# Patient Record
Sex: Female | Born: 1965
Health system: Southern US, Community
[De-identification: ages and names within clinical notes are randomized; demographics above are authoritative.]

## PROBLEM LIST (undated history)

## (undated) DIAGNOSIS — Z86010 Personal history of colonic polyps: Secondary | ICD-10-CM

## (undated) DIAGNOSIS — D869 Sarcoidosis, unspecified: Secondary | ICD-10-CM

## (undated) DIAGNOSIS — Z860101 Personal history of adenomatous and serrated colon polyps: Secondary | ICD-10-CM

## (undated) DIAGNOSIS — H269 Unspecified cataract: Secondary | ICD-10-CM

## (undated) DIAGNOSIS — M1990 Unspecified osteoarthritis, unspecified site: Secondary | ICD-10-CM

## (undated) DIAGNOSIS — K859 Acute pancreatitis without necrosis or infection, unspecified: Secondary | ICD-10-CM

## (undated) DIAGNOSIS — F32A Depression, unspecified: Secondary | ICD-10-CM

## (undated) DIAGNOSIS — K429 Umbilical hernia without obstruction or gangrene: Secondary | ICD-10-CM

## (undated) DIAGNOSIS — Z5189 Encounter for other specified aftercare: Secondary | ICD-10-CM

## (undated) DIAGNOSIS — E119 Type 2 diabetes mellitus without complications: Secondary | ICD-10-CM

## (undated) DIAGNOSIS — D649 Anemia, unspecified: Secondary | ICD-10-CM

## (undated) DIAGNOSIS — M810 Age-related osteoporosis without current pathological fracture: Secondary | ICD-10-CM

## (undated) DIAGNOSIS — R011 Cardiac murmur, unspecified: Secondary | ICD-10-CM

## (undated) DIAGNOSIS — A6000 Herpesviral infection of urogenital system, unspecified: Secondary | ICD-10-CM

## (undated) DIAGNOSIS — I10 Essential (primary) hypertension: Secondary | ICD-10-CM

## (undated) DIAGNOSIS — F419 Anxiety disorder, unspecified: Secondary | ICD-10-CM

## (undated) DIAGNOSIS — K635 Polyp of colon: Secondary | ICD-10-CM

## (undated) DIAGNOSIS — K5790 Diverticulosis of intestine, part unspecified, without perforation or abscess without bleeding: Secondary | ICD-10-CM

## (undated) DIAGNOSIS — F329 Major depressive disorder, single episode, unspecified: Secondary | ICD-10-CM

## (undated) HISTORY — DX: Major depressive disorder, single episode, unspecified: F32.9

## (undated) HISTORY — DX: Personal history of colonic polyps: Z86.010

## (undated) HISTORY — DX: Anemia, unspecified: D64.9

## (undated) HISTORY — PX: CHOLECYSTECTOMY: SHX55

## (undated) HISTORY — DX: Acute pancreatitis without necrosis or infection, unspecified: K85.90

## (undated) HISTORY — DX: Diverticulosis of intestine, part unspecified, without perforation or abscess without bleeding: K57.90

## (undated) HISTORY — DX: Sarcoidosis, unspecified: D86.9

## (undated) HISTORY — DX: Essential (primary) hypertension: I10

## (undated) HISTORY — PX: PARTIAL HYSTERECTOMY: SHX80

## (undated) HISTORY — DX: Umbilical hernia without obstruction or gangrene: K42.9

## (undated) HISTORY — DX: Herpesviral infection of urogenital system, unspecified: A60.00

## (undated) HISTORY — DX: Personal history of adenomatous and serrated colon polyps: Z86.0101

## (undated) HISTORY — PX: TONSILLECTOMY: SUR1361

## (undated) HISTORY — DX: Depression, unspecified: F32.A

## (undated) HISTORY — DX: Type 2 diabetes mellitus without complications: E11.9

## (undated) HISTORY — DX: Anxiety disorder, unspecified: F41.9

## (undated) HISTORY — PX: HERNIA REPAIR: SHX51

## (undated) HISTORY — DX: Age-related osteoporosis without current pathological fracture: M81.0

## (undated) HISTORY — DX: Unspecified osteoarthritis, unspecified site: M19.90

## (undated) HISTORY — DX: Polyp of colon: K63.5

## (undated) HISTORY — DX: Unspecified cataract: H26.9

## (undated) HISTORY — DX: Encounter for other specified aftercare: Z51.89

## (undated) HISTORY — DX: Cardiac murmur, unspecified: R01.1

---

## 2004-08-04 ENCOUNTER — Inpatient Hospital Stay (HOSPITAL_COMMUNITY): Admission: AD | Admit: 2004-08-04 | Discharge: 2004-08-04 | Payer: Self-pay | Admitting: Family Medicine

## 2004-11-11 ENCOUNTER — Inpatient Hospital Stay (HOSPITAL_COMMUNITY): Admission: AD | Admit: 2004-11-11 | Discharge: 2004-11-11 | Payer: Self-pay | Admitting: *Deleted

## 2005-01-18 ENCOUNTER — Ambulatory Visit: Payer: Self-pay | Admitting: Family Medicine

## 2005-03-21 ENCOUNTER — Ambulatory Visit: Payer: Self-pay | Admitting: Internal Medicine

## 2005-04-05 ENCOUNTER — Inpatient Hospital Stay: Payer: Self-pay | Admitting: Unknown Physician Specialty

## 2005-08-22 ENCOUNTER — Inpatient Hospital Stay: Payer: Self-pay | Admitting: Internal Medicine

## 2006-03-11 ENCOUNTER — Ambulatory Visit: Payer: Self-pay | Admitting: Gastroenterology

## 2006-03-12 ENCOUNTER — Encounter: Admission: RE | Admit: 2006-03-12 | Discharge: 2006-03-12 | Payer: Self-pay | Admitting: Family Medicine

## 2006-03-27 ENCOUNTER — Ambulatory Visit: Payer: Self-pay | Admitting: Gastroenterology

## 2006-04-12 ENCOUNTER — Ambulatory Visit: Payer: Self-pay | Admitting: Internal Medicine

## 2007-01-28 ENCOUNTER — Ambulatory Visit: Payer: Self-pay | Admitting: Internal Medicine

## 2007-01-28 LAB — CONVERTED CEMR LAB
Albumin: 3.6 g/dL (ref 3.5–5.2)
Angiotensin 1 Converting Enzyme: 79 units/L — ABNORMAL HIGH (ref 9–67)
Basophils Absolute: 0 10*3/uL (ref 0.0–0.1)
Bilirubin, Direct: 0.1 mg/dL (ref 0.0–0.3)
Eosinophils Absolute: 0 10*3/uL (ref 0.0–0.6)
Eosinophils Relative: 0.7 % (ref 0.0–5.0)
GFR calc Af Amer: 79 mL/min
GFR calc non Af Amer: 66 mL/min
Glucose, Bld: 84 mg/dL (ref 70–99)
HCT: 41.4 % (ref 36.0–46.0)
Hemoglobin: 14.3 g/dL (ref 12.0–15.0)
Lymphocytes Relative: 47.6 % — ABNORMAL HIGH (ref 12.0–46.0)
MCHC: 34.7 g/dL (ref 30.0–36.0)
MCV: 96.2 fL (ref 78.0–100.0)
Monocytes Absolute: 0.8 10*3/uL — ABNORMAL HIGH (ref 0.2–0.7)
Neutro Abs: 1.7 10*3/uL (ref 1.4–7.7)
Neutrophils Relative %: 35.3 % — ABNORMAL LOW (ref 43.0–77.0)
Potassium: 4.4 meq/L (ref 3.5–5.1)
RBC: 4.3 M/uL (ref 3.87–5.11)
Sodium: 143 meq/L (ref 135–145)
TSH: 0.7 microintl units/mL (ref 0.35–5.50)

## 2007-02-26 ENCOUNTER — Emergency Department (HOSPITAL_COMMUNITY): Admission: EM | Admit: 2007-02-26 | Discharge: 2007-02-27 | Payer: Self-pay | Admitting: Emergency Medicine

## 2007-05-01 ENCOUNTER — Emergency Department (HOSPITAL_COMMUNITY): Admission: EM | Admit: 2007-05-01 | Discharge: 2007-05-01 | Payer: Self-pay | Admitting: Emergency Medicine

## 2007-05-02 ENCOUNTER — Emergency Department (HOSPITAL_COMMUNITY): Admission: EM | Admit: 2007-05-02 | Discharge: 2007-05-02 | Payer: Self-pay | Admitting: Emergency Medicine

## 2007-05-03 ENCOUNTER — Emergency Department (HOSPITAL_COMMUNITY): Admission: EM | Admit: 2007-05-03 | Discharge: 2007-05-03 | Payer: Self-pay | Admitting: *Deleted

## 2007-10-03 DIAGNOSIS — J4530 Mild persistent asthma, uncomplicated: Secondary | ICD-10-CM | POA: Insufficient documentation

## 2007-10-03 DIAGNOSIS — H1010 Acute atopic conjunctivitis, unspecified eye: Secondary | ICD-10-CM | POA: Insufficient documentation

## 2007-10-03 DIAGNOSIS — J309 Allergic rhinitis, unspecified: Secondary | ICD-10-CM

## 2007-10-03 DIAGNOSIS — D869 Sarcoidosis, unspecified: Secondary | ICD-10-CM | POA: Insufficient documentation

## 2007-11-07 ENCOUNTER — Encounter: Payer: Self-pay | Admitting: Internal Medicine

## 2007-12-19 ENCOUNTER — Encounter: Payer: Self-pay | Admitting: Internal Medicine

## 2007-12-26 ENCOUNTER — Ambulatory Visit: Payer: Self-pay | Admitting: Internal Medicine

## 2007-12-26 LAB — CONVERTED CEMR LAB
AST: 22 units/L (ref 0–37)
Albumin: 3.8 g/dL (ref 3.5–5.2)
Alkaline Phosphatase: 76 units/L (ref 39–117)
BUN: 7 mg/dL (ref 6–23)
Basophils Absolute: 0 10*3/uL (ref 0.0–0.1)
Basophils Relative: 0.4 % (ref 0.0–1.0)
CO2: 27 meq/L (ref 19–32)
Chloride: 101 meq/L (ref 96–112)
Creatinine, Ser: 0.9 mg/dL (ref 0.4–1.2)
HCT: 39.8 % (ref 36.0–46.0)
Hemoglobin: 13.2 g/dL (ref 12.0–15.0)
MCHC: 33.2 g/dL (ref 30.0–36.0)
Monocytes Absolute: 0.8 10*3/uL — ABNORMAL HIGH (ref 0.2–0.7)
Neutrophils Relative %: 46.5 % (ref 43.0–77.0)
Potassium: 3.2 meq/L — ABNORMAL LOW (ref 3.5–5.1)
RBC: 4.27 M/uL (ref 3.87–5.11)
RDW: 12.5 % (ref 11.5–14.6)
Sodium: 138 meq/L (ref 135–145)
Total Bilirubin: 0.7 mg/dL (ref 0.3–1.2)
Total Protein: 7.8 g/dL (ref 6.0–8.3)

## 2007-12-29 ENCOUNTER — Ambulatory Visit: Payer: Self-pay | Admitting: Internal Medicine

## 2008-01-01 ENCOUNTER — Telehealth (INDEPENDENT_AMBULATORY_CARE_PROVIDER_SITE_OTHER): Payer: Self-pay | Admitting: *Deleted

## 2008-01-15 ENCOUNTER — Telehealth (INDEPENDENT_AMBULATORY_CARE_PROVIDER_SITE_OTHER): Payer: Self-pay | Admitting: *Deleted

## 2008-01-27 ENCOUNTER — Ambulatory Visit: Payer: Self-pay | Admitting: Internal Medicine

## 2008-01-27 DIAGNOSIS — H1045 Other chronic allergic conjunctivitis: Secondary | ICD-10-CM | POA: Insufficient documentation

## 2008-01-28 ENCOUNTER — Encounter: Payer: Self-pay | Admitting: Internal Medicine

## 2008-02-03 ENCOUNTER — Emergency Department (HOSPITAL_COMMUNITY): Admission: EM | Admit: 2008-02-03 | Discharge: 2008-02-03 | Payer: Self-pay | Admitting: Emergency Medicine

## 2008-05-17 ENCOUNTER — Telehealth (INDEPENDENT_AMBULATORY_CARE_PROVIDER_SITE_OTHER): Payer: Self-pay | Admitting: *Deleted

## 2008-05-19 ENCOUNTER — Encounter: Payer: Self-pay | Admitting: Internal Medicine

## 2008-08-16 ENCOUNTER — Telehealth (INDEPENDENT_AMBULATORY_CARE_PROVIDER_SITE_OTHER): Payer: Self-pay | Admitting: *Deleted

## 2008-08-23 ENCOUNTER — Telehealth (INDEPENDENT_AMBULATORY_CARE_PROVIDER_SITE_OTHER): Payer: Self-pay | Admitting: *Deleted

## 2008-08-24 ENCOUNTER — Ambulatory Visit: Payer: Self-pay | Admitting: Internal Medicine

## 2008-08-24 DIAGNOSIS — J40 Bronchitis, not specified as acute or chronic: Secondary | ICD-10-CM | POA: Insufficient documentation

## 2008-09-08 ENCOUNTER — Telehealth: Payer: Self-pay | Admitting: Internal Medicine

## 2008-10-26 ENCOUNTER — Emergency Department (HOSPITAL_COMMUNITY): Admission: EM | Admit: 2008-10-26 | Discharge: 2008-10-26 | Payer: Self-pay | Admitting: Emergency Medicine

## 2008-11-27 ENCOUNTER — Encounter: Admission: RE | Admit: 2008-11-27 | Discharge: 2008-11-27 | Payer: Self-pay | Admitting: Sports Medicine

## 2008-12-22 ENCOUNTER — Encounter: Admission: RE | Admit: 2008-12-22 | Discharge: 2009-01-17 | Payer: Self-pay | Admitting: Unknown Physician Specialty

## 2009-02-10 ENCOUNTER — Ambulatory Visit: Payer: Self-pay | Admitting: Internal Medicine

## 2009-02-14 ENCOUNTER — Ambulatory Visit: Payer: Self-pay | Admitting: Internal Medicine

## 2009-02-15 ENCOUNTER — Telehealth: Payer: Self-pay | Admitting: Internal Medicine

## 2009-02-16 LAB — CONVERTED CEMR LAB
Basophils Absolute: 0 10*3/uL (ref 0.0–0.1)
Eosinophils Absolute: 0.1 10*3/uL (ref 0.0–0.7)
HCT: 38 % (ref 36.0–46.0)
Lymphs Abs: 3 10*3/uL (ref 0.7–4.0)
MCHC: 33.8 g/dL (ref 30.0–36.0)
MCV: 93.8 fL (ref 78.0–100.0)
Monocytes Absolute: 0.4 10*3/uL (ref 0.1–1.0)
Neutrophils Relative %: 48.3 % (ref 43.0–77.0)
Platelets: 269 10*3/uL (ref 150.0–400.0)
RDW: 12.7 % (ref 11.5–14.6)

## 2009-03-08 ENCOUNTER — Ambulatory Visit: Payer: Self-pay | Admitting: Internal Medicine

## 2009-03-08 ENCOUNTER — Telehealth: Payer: Self-pay | Admitting: Internal Medicine

## 2009-03-08 LAB — CONVERTED CEMR LAB: Angiotensin 1 Converting Enzyme: 91 units/L — ABNORMAL HIGH (ref 9–67)

## 2009-03-09 ENCOUNTER — Telehealth (INDEPENDENT_AMBULATORY_CARE_PROVIDER_SITE_OTHER): Payer: Self-pay | Admitting: *Deleted

## 2009-03-15 ENCOUNTER — Ambulatory Visit: Payer: Self-pay | Admitting: Internal Medicine

## 2009-03-15 DIAGNOSIS — J029 Acute pharyngitis, unspecified: Secondary | ICD-10-CM | POA: Insufficient documentation

## 2009-03-21 ENCOUNTER — Telehealth (INDEPENDENT_AMBULATORY_CARE_PROVIDER_SITE_OTHER): Payer: Self-pay | Admitting: *Deleted

## 2009-03-23 LAB — CONVERTED CEMR LAB
AST: 57 units/L — ABNORMAL HIGH (ref 0–37)
BUN: 8 mg/dL (ref 6–23)
Basophils Absolute: 0 10*3/uL (ref 0.0–0.1)
Bilirubin, Direct: 0.1 mg/dL (ref 0.0–0.3)
CO2: 33 meq/L — ABNORMAL HIGH (ref 19–32)
Calcium: 10 mg/dL (ref 8.4–10.5)
Glucose, Bld: 76 mg/dL (ref 70–99)
HCT: 39.5 % (ref 36.0–46.0)
Lymphocytes Relative: 36.5 % (ref 12.0–46.0)
Lymphs Abs: 2.6 10*3/uL (ref 0.7–4.0)
Monocytes Relative: 7.2 % (ref 3.0–12.0)
Neutrophils Relative %: 53.8 % (ref 43.0–77.0)
Platelets: 281 10*3/uL (ref 150.0–400.0)
RDW: 12.4 % (ref 11.5–14.6)
Sodium: 142 meq/L (ref 135–145)
Total Bilirubin: 0.6 mg/dL (ref 0.3–1.2)
WBC: 7.1 10*3/uL (ref 4.5–10.5)

## 2009-04-01 ENCOUNTER — Ambulatory Visit: Payer: Self-pay | Admitting: Internal Medicine

## 2009-05-10 ENCOUNTER — Encounter: Payer: Self-pay | Admitting: Internal Medicine

## 2009-05-12 ENCOUNTER — Telehealth: Payer: Self-pay | Admitting: Internal Medicine

## 2009-05-16 ENCOUNTER — Encounter: Payer: Self-pay | Admitting: Internal Medicine

## 2009-06-28 ENCOUNTER — Emergency Department (HOSPITAL_COMMUNITY): Admission: EM | Admit: 2009-06-28 | Discharge: 2009-06-29 | Payer: Self-pay | Admitting: Emergency Medicine

## 2009-07-15 ENCOUNTER — Ambulatory Visit: Payer: Self-pay | Admitting: Internal Medicine

## 2009-08-04 ENCOUNTER — Ambulatory Visit: Payer: Self-pay | Admitting: Internal Medicine

## 2009-08-11 ENCOUNTER — Encounter: Admission: RE | Admit: 2009-08-11 | Discharge: 2009-08-11 | Payer: Self-pay | Admitting: Gastroenterology

## 2009-08-12 LAB — CONVERTED CEMR LAB
ALT: 13 units/L (ref 0–35)
Albumin: 3.8 g/dL (ref 3.5–5.2)
Angiotensin 1 Converting Enzyme: 67 units/L (ref 9–67)
Basophils Relative: 1.1 % (ref 0.0–3.0)
Eosinophils Relative: 3.1 % (ref 0.0–5.0)
HCT: 40.3 % (ref 36.0–46.0)
Lymphs Abs: 2.5 10*3/uL (ref 0.7–4.0)
MCV: 96 fL (ref 78.0–100.0)
Monocytes Absolute: 0.5 10*3/uL (ref 0.1–1.0)
Platelets: 285 10*3/uL (ref 150.0–400.0)
RBC: 4.19 M/uL (ref 3.87–5.11)
Total Protein: 7.9 g/dL (ref 6.0–8.3)
WBC: 6 10*3/uL (ref 4.5–10.5)

## 2009-09-07 ENCOUNTER — Ambulatory Visit: Payer: Self-pay | Admitting: Internal Medicine

## 2009-11-09 ENCOUNTER — Ambulatory Visit: Payer: Self-pay | Admitting: Internal Medicine

## 2009-11-16 ENCOUNTER — Encounter: Payer: Self-pay | Admitting: Internal Medicine

## 2009-11-16 LAB — CONVERTED CEMR LAB: Angiotensin 1 Converting Enzyme: 67 units/L (ref 9–67)

## 2009-11-21 ENCOUNTER — Telehealth: Payer: Self-pay | Admitting: Internal Medicine

## 2009-11-28 ENCOUNTER — Telehealth (INDEPENDENT_AMBULATORY_CARE_PROVIDER_SITE_OTHER): Payer: Self-pay | Admitting: *Deleted

## 2010-01-04 ENCOUNTER — Ambulatory Visit: Payer: Self-pay | Admitting: Internal Medicine

## 2010-01-06 ENCOUNTER — Ambulatory Visit: Payer: Self-pay | Admitting: Internal Medicine

## 2010-03-08 ENCOUNTER — Ambulatory Visit: Payer: Self-pay | Admitting: Internal Medicine

## 2010-09-04 ENCOUNTER — Ambulatory Visit: Payer: Self-pay | Admitting: Internal Medicine

## 2010-09-04 DIAGNOSIS — K219 Gastro-esophageal reflux disease without esophagitis: Secondary | ICD-10-CM | POA: Insufficient documentation

## 2010-09-05 ENCOUNTER — Inpatient Hospital Stay (HOSPITAL_COMMUNITY): Admission: EM | Admit: 2010-09-05 | Discharge: 2010-09-08 | Payer: Self-pay | Admitting: Emergency Medicine

## 2010-09-11 ENCOUNTER — Telehealth: Payer: Self-pay | Admitting: Internal Medicine

## 2010-12-14 ENCOUNTER — Emergency Department (HOSPITAL_COMMUNITY)
Admission: EM | Admit: 2010-12-14 | Discharge: 2010-12-15 | Payer: Self-pay | Source: Home / Self Care | Admitting: Emergency Medicine

## 2010-12-19 NOTE — Assessment & Plan Note (Signed)
Summary: 2 months/apc   Copy to:  A.V.Blount  Primary Provider/Referring Provider:  Pershing Proud / [facility]  CC:  2 month follow up visit.  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  November 09, 2009- Sarcoid, Rhinoconjunctivitis Primary MD gave Abilify, and a cream for the rash on her back- triamcinolone.  Notes short of breath with cough productive of clear to trace brown. Coughs until she retches. Wakes coughing and spitting. Has felt she has a cold, treating otc. Cough syrup helps. Left ribs hurt when on side. Denies fever, sweats other than hormones, swollen nodes.  January 04, 2010- Sarcoid, Rhinoconjunctivitis Blames pollen for 1 week of eyes watering, throat sore, nasal conjunctivits, sneeze. No cough. Chest tight. Went to her primary yest and got scripts for  bactrim and loratadine.  ACE level ULN at 67. last CXR stable scarring. Likes Neti pot.  January 06, 2010- Sarcoid, Rhinconjunctivits Head congestion and vertex headeache. Blowing out of head- yellow green. Sore throat. conitnues to do her Neti pot. Was just here 2 days ago.  March 08, 2010- Sarcoid, rhinoconjunctivitis Pollen makes eyes and nose water. Stable exercise tolerance with dyspnea. Blames Abilify for weight gain and abdominal obesity, so she stopped it. Using her neb about twice daily now. Uses Flovent and Ventolin daily. We reviewed these again. Denies fever, nodes, chest pain. palpitation, rash.   Current Medications (verified): 1)  Ventolin Hfa 108 (90 Base) Mcg/act Aers (Albuterol Sulfate) .... 2 Puffs Four Times A Day As Needed 2)  Flovent Hfa 220 Mcg/act  Aero (Fluticasone Propionate  Hfa) .... 2 Inhalations Two Times A Day 3)  Albuterol Sulfate (2.5 Mg/89ml) 0.083%  Nebu (Albuterol Sulfate) .Marland Kitchen.. 1 Neb Four Times A Day Prn  Allergies (verified): 1)  ! Pcn 2)  ! Cipro 3)  ! * Latex  Past  History:  Past Medical History: Last updated: 12/26/2007 Asthma Sarcoidosis Diverticulosis colon polyps  Past Surgical History: Last updated: 08/04/2009 C-section x 3 lap- cholecystectomy hernia repair x 4 tonsillectomy Hysterectomy- fibroids  Family History: Last updated: 01/27/2008 Mother- ovarian cancer, colon polyps sister- breast cancer Father - colon cancer  Social History: Last updated: 08/04/2009 Patient states former smoker. Son incarcerated  Disability due to sarcoid Studying criminal justice on-line course  Risk Factors: Smoking Status: quit (12/26/2007) Packs/Day: ? (12/26/2007)  Review of Systems      See HPI       The patient complains of dyspnea on exertion.  The patient denies anorexia, fever, weight loss, weight gain, vision loss, decreased hearing, hoarseness, chest pain, syncope, peripheral edema, prolonged cough, headaches, hemoptysis, and severe indigestion/heartburn.    Vital Signs:  Patient profile:   45 year old female Height:      64 inches Weight:      158.38 pounds BMI:     27.28 O2 Sat:      99 % on Room air Pulse rate:   64 / minute BP sitting:   110 / 72  (left arm) Cuff size:   regular  Vitals Entered By: Reynaldo Minium CMA (March 08, 2010 9:38 AM)  O2 Flow:  Room air  Physical Exam  Additional Exam:  General: A/Ox3; pleasant and cooperative, NAD, SKIN: faint bilateral hyperpigmented rash with minimal excoriation across her back NODES:  fullness of supraclavicular nodes. HEENT: [place]/AT, EOM- WNL, Conjuctivae- clear, PERRLA, TM-WNL, Nose- turbinate edema, yellow, Throat- clear and wnl, Mallampati  II-III NECK: Supple w/ fair ROM, JVD- none, normal carotid impulses w/o bruits Thyroid-  CHEST: Clear to P&A, minor cough, unlabored HEART: RRR, no m/g/r heard ABDOMEN: Soft and nl;  ZOX:WRUE, nl pulses, no edema  NEURO: Grossly intact to observation      Impression & Recommendations:  Problem # 1:  RHINITIS  (ICD-472.0)  Allergic rhinoconjunctivitis. We will steer her to otc antihistamines for occasional use.  Problem # 2:  PULMONARY SARCOIDOSIS (ICD-135) This is not clinically active now. We will continue to track her status. Walking and weight control are encouraged.  Other Orders: Est. Patient Level III (45409)  Patient Instructions: 1)  Please schedule a follow-up appointment in 6 months. 2)  Try otc antihistamine for allergy as needed: 3)  Claritin/ loratadine 4)  Zyrtec/ cetirizine 5)  allegra/ fexofenadine

## 2010-12-19 NOTE — Progress Notes (Signed)
Summary: results  Phone Note Call from Patient   Caller: Patient Call For: young Summary of Call: please send blood work results sent to Dr. Jone Baseman. [facility] office - Fax [phone] Initial call taken by: Eugene Gavia,  November 28, 2009 10:12 AM  Follow-up for Phone Call        faxed labs 01/10//Juanita

## 2010-12-19 NOTE — Assessment & Plan Note (Signed)
Summary: ROV 6 MONTHS///KP   Copy to:  A.V.Blount  Primary Provider/Referring Provider:  Pershing Proud / [facility]  CC:  6 month follow up visit-asthma; doing good overall.Marland Kitchen  History of Present Illness: January 04, 2010- Sarcoid, Rhinoconjunctivitis Blames pollen for 1 week of eyes watering, throat sore, nasal conjunctivits, sneeze. No cough. Chest tight. Went to her primary yest and got scripts for  bactrim and loratadine.  ACE level ULN at 67. last CXR stable scarring. Likes Neti pot.  January 06, 2010- Sarcoid, Rhinconjunctivits Head congestion and vertex headeache. Blowing out of head- yellow green. Sore throat. conitnues to do her Neti pot. Was just here 2 days ago.  March 08, 2010- Sarcoid, rhinoconjunctivitis Pollen makes eyes and nose water. Stable exercise tolerance with dyspnea. Blames Abilify for weight gain and abdominal obesity, so she stopped it. Using her neb about twice daily now. Uses Flovent and Ventolin daily. We reviewed these again. Denies fever, nodes, chest pain. palpitation, rash.  September 04, 2010- Sarcoid, rhinoconjunctivitis, GERD Nurse CC: 6 month follow up visit-asthma; doing good overall. Used her ventolin more during the summer heat, more because she didn't know what else to do but not really wheezey. Now chest is comfortable. Sometimes feels tight in chest and will lie on side to feel better than when on stomach. Feels strangled in throat if lies on stomach.  With Fall season she is having more nasal itchin, sneezing and blowing.  CXR 08/04/09- stable scarring, NAD ACE 11/09/09- 67 (9-67).    Asthma History    Initial Asthma Severity Rating:    Age range: 12+ years    Symptoms: 0-2 days/week    Nighttime Awakenings: 0-2/month    Interferes w/ normal activity: no limitations    SABA use (not for EIB): 0-2 days/week    Asthma Severity Assessment: Intermittent   Preventive Screening-Counseling & Management  Alcohol-Tobacco     Smoking Status:  quit     Packs/Day: ?     Year Started: 10 years     Year Quit: 2007     Pack years: occ for 4 years  1 pack a week  Current Medications (verified): 1)  Ventolin Hfa 108 (90 Base) Mcg/act Aers (Albuterol Sulfate) .... 2 Puffs Four Times A Day As Needed 2)  Flovent Hfa 220 Mcg/act  Aero (Fluticasone Propionate  Hfa) .... 2 Inhalations Two Times A Day 3)  Albuterol Sulfate (2.5 Mg/43ml) 0.083%  Nebu (Albuterol Sulfate) .Marland Kitchen.. 1 Neb Four Times A Day Prn  Allergies (verified): 1)  ! Pcn 2)  ! Cipro 3)  ! * Latex  Past History:  Past Medical History: Last updated: 12/26/2007 Asthma Sarcoidosis Diverticulosis colon polyps  Past Surgical History: Last updated: 08/04/2009 C-section x 3 lap- cholecystectomy hernia repair x 4 tonsillectomy Hysterectomy- fibroids  Family History: Last updated: 01/27/2008 Mother- ovarian cancer, colon polyps sister- breast cancer Father - colon cancer  Social History: Last updated: 08/04/2009 Patient states former smoker. Son incarcerated  Disability due to sarcoid Studying criminal justice on-line course  Risk Factors: Smoking Status: quit (09/04/2010) Packs/Day: ? (09/04/2010)  Review of Systems      See HPI       The patient complains of non-productive cough, acid heartburn, and nasal congestion/difficulty breathing through nose.  The patient denies shortness of breath with activity, shortness of breath at rest, productive cough, coughing up blood, chest pain, irregular heartbeats, indigestion, loss of appetite, weight change, abdominal pain, difficulty swallowing, sore throat, tooth/dental problems, headaches, sneezing, rash, change  in color of mucus, and fever.    Vital Signs:  Patient profile:   45 year old female Height:      64 inches Weight:      165.13 pounds BMI:     28.45 O2 Sat:      100 % on Room air Pulse rate:   70 / minute BP sitting:   110 / 70  (left arm) Cuff size:   regular  Vitals Entered By: Reynaldo Minium CMA  (September 04, 2010 10:36 AM)  O2 Flow:  Room air CC: 6 month follow up visit-asthma; doing good overall.   Physical Exam  Additional Exam:  General: A/Ox3; pleasant and cooperative, NAD, SKIN: faint bilateral hyperpigmented rash with minimal excoriation across her back NODES:  fullness of supraclavicular nodes. HEENT: [place]/AT, EOM- WNL, Conjuctivae- clear, PERRLA, TM-WNL, Nose- turbinate edema, yellow, Throat- clear and wnl, Mallampati  II-III NECK: Supple w/ fair ROM, JVD- none, normal carotid impulses w/o bruits Thyroid-  CHEST: Clear to P&A, minor cough, unlabored HEART: RRR, no m/g/r heard ABDOMEN: Soft and nl;  ZOX:WRUE, nl pulses, no edema  NEURO: Grossly intact to observation      Impression & Recommendations:  Problem # 1:  PULMONARY SARCOIDOSIS (ICD-135) ACE is stable at the upper end of normal range. She has gained weight and will speak to her primary doctor about thyroid status, but I don't think her sarcoid status has changed clinically. We will update CXR and give flu shot.   Problem # 2:  BRONCHITIS (ICD-490)  She was using her rescue inhaler inappropriately this summer because she was uncomfortable in hot u=hmid weather, and it wasn't really helping. We discussed this.  Her updated medication list for this problem includes:    Ventolin Hfa 108 (90 Base) Mcg/act Aers (Albuterol sulfate) .Marland Kitchen... 2 puffs four times a day as needed    Flovent Hfa 220 Mcg/act Aero (Fluticasone propionate  hfa) .Marland Kitchen... 2 inhalations two times a day    Albuterol Sulfate (2.5 Mg/27ml) 0.083% Nebu (Albuterol sulfate) .Marland Kitchen... 1 neb four times a day prn  Problem # 3:  GERD (ICD-530.81)  Educated about GERD/ reflux and reflux precautions.  I suggested she elevate head of bed with brick. We can add an acid blocker if needed.   Other Orders: Est. Patient Level IV (45409) Flu Vaccine 23yrs + MEDICARE PATIENTS (W1191) Administration Flu vaccine - MCR (G0008) T-2 View CXR (71020TC)  Patient  Instructions: 1)  Please schedule a follow-up appointment in 1 year. 2)  A chest x-ray has been recommended.  Your imaging study may require preauthorization.  3)  Consider trying to elevate the head of your bedframe by putting a brick under each head leg. 4)  If you need to take a medicine for heart burn- try otc omeprazole or Pepcid. 5)  Flu vax   Immunization History:  Influenza Immunization History:    Influenza:  historical (09/15/2009) Flu Vaccine Consent Questions     Do you have a history of severe allergic reactions to this vaccine? no    Any prior history of allergic reactions to egg and/or gelatin? no    Do you have a sensitivity to the preservative Thimersol? no    Do you have a past history of Guillan-Barre Syndrome? no    Do you currently have an acute febrile illness? no    Have you ever had a severe reaction to latex? no    Vaccine information given and explained to patient? yes  Are you currently pregnant? no    Lot Number:[id number]   Exp Date:05/19/2011 Reynaldo Minium CMA  September 04, 2010 11:59 AM    Site Given  Left Deltoid IMical (09/15/2009)       .lbmedflu

## 2010-12-19 NOTE — Assessment & Plan Note (Signed)
Summary: bad allergies, want breathing treatment/apc   Copy to:  A.V.Blount  Primary Provider/Referring Provider:  Pershing Proud / [facility]  CC:  allergy problems; stopped up nose and eyes watery.Requests Nasal tx..  History of Present Illness: September 07, 2009- Sarcoid, rhinoconjunctivitis Was on prednisone 10 mg two times a day until she lost her supply  two weeks ago  when she moved.She denies recognizing much difference with fever or cough. She has a rash on her back, and persistent night sweats which are better than before. PFT- off pred-Mild slowing small airways, no response. 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  November 09, 2009- Sarcoid, Rhinoconjunctivitis Primary MD gave Abilify, and a cream for the rash on her back- triamcinolone.  Notes short of breath with cough productive of clear to trace brown. Coughs until she retches. Wakes coughing and spitting. Has felt she has a cold, treating otc. Cough syrup helps. Left ribs hurt when on side. Denies fever, sweats other than hormones, swollen nodes.  January 04, 2010- Sarcoid, Rhinoconjunctivitis Blames pollen for 1 week of eyes watering, throat sore, nasal conjunctivits, sneeze. No cough. Chest tight. Went to her primary yest and got scripts for  bactrim and loratadine.  ACE level ULN at 67. last CXR stable scarring. Likes Neti pot.  January 06, 2010- Sarcoid, Rhinconjunctivits Head congestion and vertex headeache. Blowing out of head- yellow green. Sore throat. conitnues to do her Neti pot. Was just here 2 days ago.    Current Medications (verified): 1)  Ventolin Hfa 108 (90 Base) Mcg/act Aers (Albuterol Sulfate) .... 2 Puffs Four Times A Day As Needed 2)  Flovent Hfa 220 Mcg/act  Aero (Fluticasone Propionate  Hfa) .... 2 Inhalations Two Times A  Day 3)  Albuterol Sulfate (2.5 Mg/22ml) 0.083%  Nebu (Albuterol Sulfate) .Marland Kitchen.. 1 Neb Four Times A Day Prn 4)  Abilify 10 Mg Tabs (Aripiprazole) .... Take 1 By Mouth Once Daily  Allergies (verified): 1)  ! Pcn 2)  ! Cipro 3)  ! * Latex  Past History:  Past Medical History: Last updated: 12/26/2007 Asthma Sarcoidosis Diverticulosis colon polyps  Past Surgical History: Last updated: 08/04/2009 C-section x 3 lap- cholecystectomy hernia repair x 4 tonsillectomy Hysterectomy- fibroids  Family History: Last updated: 01/27/2008 Mother- ovarian cancer, colon polyps sister- breast cancer Father - colon cancer  Social History: Last updated: 08/04/2009 Patient states former smoker. Son incarcerated  Disability due to sarcoid Studying criminal justice on-line course  Risk Factors: Smoking Status: quit (12/26/2007) Packs/Day: ? (12/26/2007)  Review of Systems      See HPI       The patient complains of headaches.  The patient denies anorexia, fever, weight loss, weight gain, vision loss, decreased hearing, hoarseness, chest pain, syncope, dyspnea on exertion, peripheral edema, prolonged cough, hemoptysis, abdominal pain, and severe indigestion/heartburn.    Vital Signs:  Patient profile:   45 year old female Height:      64 inches Weight:      154.13 pounds BMI:     26.55 O2 Sat:      98 % on Room air Pulse rate:   85 / minute BP sitting:   116 / 76  (left arm) Cuff size:   regular  Vitals Entered By: Reynaldo Minium CMA (January 06, 2010 2:40 PM)  O2 Flow:  Room air  Physical Exam  Additional Exam:  General: A/Ox3; pleasant and cooperative, NAD, SKIN: faint bilateral hyperpigmented rash with minimal excoriation across her back NODES:  fullness of supraclavicular nodes. HEENT: /AT, EOM- WNL, Conjuctivae- clear, PERRLA, TM-WNL, Nose- turbinate edema, yellow, Throat- clear and wnl, Mellampatti  II-III NECK: Supple w/ fair ROM, JVD- none, normal carotid impulses w/o  bruits Thyroid-  CHEST: Clear to P&A, minor cough, unlabored HEART: RRR, no m/g/r heard ABDOMEN: Soft and nl;  VWU:JWJX, nl pulses, no edema  NEURO: Grossly intact to observation      Impression & Recommendations:  Problem # 1:  RHINITIS (ICD-472.0)  Rhinitis or rhinosiusitis. We will give neb. She has a leftover script for bacrim she can take if it gets more purulent.  Problem # 2:  PULMONARY SARCOIDOSIS (ICD-135) This is probable basis for her supraclavicular nodes.  Other Orders: Est. Patient Level III (91478)  Patient Instructions: 1)  Please schedule a follow-up appointment in 6 months. 2)  Neb neo nasal  3)  depo 80 4)  if your sinuses get more infected, then take the bactrim 5)  Continue with your Neti pot.

## 2010-12-19 NOTE — Progress Notes (Signed)
Summary: bloodwork  Phone Note Call from Patient Call back at [phone]   Caller: Patient Call For: Zaylon Bossier Summary of Call: calling about blood work Initial call taken by: Rickard Patience,  November 21, 2009 8:16 AM  Follow-up for Phone Call        Katie have u already sent letter on this pt? Looks like a different # was given. Zackery Barefoot CMA  November 21, 2009 10:55 AM   pt called and was advised of lab results. Pt request that labs be faxed to [facility] Dr. Henrene Hawking. Lab faxed. Carron Curie CMA  November 21, 2009 11:08 AM

## 2010-12-19 NOTE — Miscellaneous (Signed)
[facility]   Imported By: Sherian Rein 05/18/2009 11:38:54  _____________________________________________________________________  External Attachment:    Type:   Image     Comment:   External Document

## 2010-12-19 NOTE — Assessment & Plan Note (Signed)
Summary: 1 month/apc   Copy to:  A.V.Blount  Primary Provider/Referring Provider:  Pershing Proud / [facility]  CC:  Follow up visit-Sarcoid levels up; currently on bactrium.Marland Kitchen  History of Present Illness: History of Present Illness:  September 07, 2009- Sarcoid, rhinoconjunctivitis Was on prednisone 10 mg two times a day until she lost her supply  two weeks ago  when she moved.She denies recognizing much difference with fever or cough. She has a rash on her back, and persistent night sweats which are better than before. PFT- off pred-Mild slowing small airways, no response. 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  November 09, 2009- Sarcoid, Rhinoconjunctivitis Primary MD gave Abilify, and a cream for the rash on her back- triamcinolone.  Notes short of breath with cough productive of clear to trace brown. Coughs until she retches. Wakes coughing and spitting. Has felt she has a cold, treating otc. Cough syrup helps. Left ribs hurt when on side. Denies fever, sweats other than hormones, swollen nodes.  January 04, 2010- Sarcoid, Rhinoconjunctivitis Blames pollen for 1 week of eyes watering, throat sore, nasal conjunctivits, sneeze. No cough. Chest tight. Went to her primary yest and got scripts for  bactrim and loratadine.  ACE level ULN at 67. last CXR stable scarring. Likes Neti pot.      Current Medications (verified): 1)  Ventolin Hfa 108 (90 Base) Mcg/act Aers (Albuterol Sulfate) .... 2 Puffs Four Times A Day As Needed 2)  Flovent Hfa 220 Mcg/act  Aero (Fluticasone Propionate  Hfa) .... 2 Inhalations Two Times A Day 3)  Albuterol Sulfate (2.5 Mg/25ml) 0.083%  Nebu (Albuterol Sulfate) .Marland Kitchen.. 1 Neb Four Times A Day Prn 4)  Abilify 10 Mg Tabs (Aripiprazole) .... Take 1 By Mouth Once Daily  Allergies (verified): 1)   ! Pcn 2)  ! Cipro 3)  ! * Latex  Past History:  Past Medical History: Last updated: 12/26/2007 Asthma Sarcoidosis Diverticulosis colon polyps  Past Surgical History: Last updated: 08/04/2009 C-section x 3 lap- cholecystectomy hernia repair x 4 tonsillectomy Hysterectomy- fibroids  Family History: Last updated: 01/27/2008 Mother- ovarian cancer, colon polyps sister- breast cancer Father - colon cancer  Social History: Last updated: 08/04/2009 Patient states former smoker. Son incarcerated  Disability due to sarcoid Studying criminal justice on-line course  Risk Factors: Smoking Status: quit (12/26/2007) Packs/Day: ? (12/26/2007)  Review of Systems      See HPI  The patient denies anorexia, fever, weight loss, weight gain, vision loss, decreased hearing, hoarseness, chest pain, syncope, dyspnea on exertion, peripheral edema, prolonged cough, headaches, hemoptysis, abdominal pain, and severe indigestion/heartburn.    Vital Signs:  Patient profile:   45 year old female Height:      64 inches Weight:      153.25 pounds BMI:     26.40 O2 Sat:      100 % on Room air Pulse rate:   82 / minute BP sitting:   126 / 78  (left arm) Cuff size:   regular  Vitals Entered By: Reynaldo Minium CMA (January 04, 2010 9:14 AM)  O2 Flow:  Room air  Physical Exam  Additional Exam:  General: A/Ox3; pleasant and cooperative, NAD, SKIN: faint bilateral hyperpigmented rash with minimal excoriation across her back NODES: question fullness of supraclavicular nodes. HEENT: [place]/AT, EOM- WNL, Conjuctivae- clear, PERRLA,  TM-WNL, Nose- turbinate edema, Throat- clear and wnl, Mellampatti  II NECK: Supple w/ fair ROM, JVD- none, normal carotid impulses w/o bruits Thyroid-  CHEST: Clear to P&A, , unlabored, laughing without wheeze. HEART: RRR, no m/g/r heard ABDOMEN: Soft and nl;  ZOX:WRUE, nl pulses, no edema  NEURO: Grossly intact to observation      Impression &  Recommendations:  Problem # 1:  RHINITIS (ICD-472.0)  Not clear if this is a viral URI or early seasonal allergic rhinitis. We discussed options. she will use either mucinex or her loratadine for now with extra fluids. I doubt the bactrim will help.   Problem # 2:  PULMONARY SARCOIDOSIS (ICD-135) There may be low grade sarcoid activity but I don't think it needs to be treated at this time. We will watch.  Other Orders: Est. Patient Level III (45409)  Patient Instructions: 1)  Please schedule a follow-up appointment in 2 months 2)  Extra fluids, Mucinex, Sudafed-PE or your loratadine may help as needed.

## 2010-12-19 NOTE — Progress Notes (Signed)
Summary: Recent Hospital stay-Questions for CDY  Phone Note Call from Patient   Caller: Patient Call For: Young Summary of Call: Pt called to let CDY know that she was admitted to [facility] hospital on 09-04-2010 through 09-08-2010-had pain in stomach area. Would like to have CDY review through her Discharge summary and CXR, CT's, and other tests performed while in hospital and see if anything happend due to her Sarcoidosis. Please call patient with any answers. Thanks. Initial call taken by: Reynaldo Minium CMA,  September 11, 2010 12:11 PM  Follow-up for Phone Call        Per CDY- please let patient know that he did not see anything where sarcoid was a consideration, except for known old scarring in the lung.Reynaldo Minium CMA  September 13, 2010 9:07 AM   pt advised. Carron Curie CMA  September 13, 2010 9:23 AM

## 2010-12-26 ENCOUNTER — Ambulatory Visit: Payer: Self-pay | Admitting: Internal Medicine

## 2010-12-28 ENCOUNTER — Ambulatory Visit: Payer: Self-pay | Admitting: Internal Medicine

## 2011-01-26 ENCOUNTER — Encounter (INDEPENDENT_AMBULATORY_CARE_PROVIDER_SITE_OTHER): Payer: Self-pay | Admitting: *Deleted

## 2011-01-26 ENCOUNTER — Ambulatory Visit (INDEPENDENT_AMBULATORY_CARE_PROVIDER_SITE_OTHER)
Admission: RE | Admit: 2011-01-26 | Discharge: 2011-01-26 | Disposition: A | Discharge status: Home / Self Care | Payer: Medicare Other | Source: Ambulatory Visit | Attending: Internal Medicine | Admitting: Internal Medicine

## 2011-01-26 ENCOUNTER — Other Ambulatory Visit: Payer: Medicare Other

## 2011-01-26 ENCOUNTER — Encounter: Payer: Self-pay | Admitting: Internal Medicine

## 2011-01-26 ENCOUNTER — Ambulatory Visit (INDEPENDENT_AMBULATORY_CARE_PROVIDER_SITE_OTHER): Payer: Medicare Other | Admitting: Internal Medicine

## 2011-01-26 ENCOUNTER — Other Ambulatory Visit: Payer: Self-pay | Admitting: Internal Medicine

## 2011-01-26 DIAGNOSIS — D869 Sarcoidosis, unspecified: Secondary | ICD-10-CM

## 2011-01-26 DIAGNOSIS — J310 Chronic rhinitis: Secondary | ICD-10-CM

## 2011-01-26 DIAGNOSIS — J45909 Unspecified asthma, uncomplicated: Secondary | ICD-10-CM

## 2011-01-26 LAB — CBC WITH DIFFERENTIAL/PLATELET
Basophils Absolute: 0 10*3/uL (ref 0.0–0.1)
Eosinophils Absolute: 0.1 10*3/uL (ref 0.0–0.7)
HCT: 40.6 % (ref 36.0–46.0)
Hemoglobin: 13.7 g/dL (ref 12.0–15.0)
Lymphs Abs: 1.9 10*3/uL (ref 0.7–4.0)
MCHC: 33.7 g/dL (ref 30.0–36.0)
Monocytes Absolute: 0.4 10*3/uL (ref 0.1–1.0)
Neutro Abs: 2.8 10*3/uL (ref 1.4–7.7)
Platelets: 278 10*3/uL (ref 150.0–400.0)
RDW: 14.1 % (ref 11.5–14.6)

## 2011-01-26 LAB — BASIC METABOLIC PANEL
Calcium: 10.3 mg/dL (ref 8.4–10.5)
Creatinine, Ser: 0.8 mg/dL (ref 0.4–1.2)
GFR: 108.1 mL/min (ref >60.00)
Sodium: 140 mEq/L (ref 135–145)

## 2011-01-26 LAB — HEPATIC FUNCTION PANEL
Alkaline Phosphatase: 75 U/L (ref 39–117)
Bilirubin, Direct: 0.1 mg/dL (ref 0.0–0.3)
Total Bilirubin: 0.5 mg/dL (ref 0.3–1.2)
Total Protein: 7.6 g/dL (ref 6.0–8.3)

## 2011-01-26 LAB — SEDIMENTATION RATE: Sed Rate: 10 mm/hr (ref 0–22)

## 2011-01-31 LAB — DIFFERENTIAL
Basophils Absolute: 0 10*3/uL (ref 0.0–0.1)
Basophils Relative: 0 % (ref 0–1)
Eosinophils Absolute: 0.1 10*3/uL (ref 0.0–0.7)
Monocytes Absolute: 0.6 10*3/uL (ref 0.1–1.0)
Monocytes Relative: 9 % (ref 3–12)
Neutrophils Relative %: 66 % (ref 43–77)

## 2011-01-31 LAB — COMPREHENSIVE METABOLIC PANEL
ALT: 14 U/L (ref 0–35)
AST: 64 U/L — ABNORMAL HIGH (ref 0–37)
Albumin: 3.1 g/dL — ABNORMAL LOW (ref 3.5–5.2)
Albumin: 3.4 g/dL — ABNORMAL LOW (ref 3.5–5.2)
Albumin: 3.9 g/dL (ref 3.5–5.2)
Alkaline Phosphatase: 176 U/L — ABNORMAL HIGH (ref 39–117)
Alkaline Phosphatase: 70 U/L (ref 39–117)
BUN: 2 mg/dL — ABNORMAL LOW (ref 6–23)
BUN: 6 mg/dL (ref 6–23)
CO2: 27 mEq/L (ref 19–32)
Calcium: 9.2 mg/dL (ref 8.4–10.5)
Calcium: 9.4 mg/dL (ref 8.4–10.5)
Chloride: 104 mEq/L (ref 96–112)
Chloride: 108 mEq/L (ref 96–112)
Chloride: 110 mEq/L (ref 96–112)
Creatinine, Ser: 0.77 mg/dL (ref 0.4–1.2)
Creatinine, Ser: 0.92 mg/dL (ref 0.4–1.2)
GFR calc Af Amer: >60 mL/min (ref >60)
GFR calc Af Amer: >60 mL/min (ref >60)
GFR calc non Af Amer: >60 mL/min (ref >60)
Glucose, Bld: 104 mg/dL — ABNORMAL HIGH (ref 70–99)
Glucose, Bld: 126 mg/dL — ABNORMAL HIGH (ref 70–99)
Glucose, Bld: 87 mg/dL (ref 70–99)
Potassium: 3.8 mEq/L (ref 3.5–5.1)
Potassium: 3.8 mEq/L (ref 3.5–5.1)
Sodium: 139 mEq/L (ref 135–145)
Total Bilirubin: 0.6 mg/dL (ref 0.3–1.2)
Total Bilirubin: 0.9 mg/dL (ref 0.3–1.2)
Total Bilirubin: 1 mg/dL (ref 0.3–1.2)
Total Protein: 7.2 g/dL (ref 6.0–8.3)

## 2011-01-31 LAB — CBC
HCT: 31.5 % — ABNORMAL LOW (ref 36.0–46.0)
HCT: 35.8 % — ABNORMAL LOW (ref 36.0–46.0)
HCT: 38.4 % (ref 36.0–46.0)
Hemoglobin: 10.6 g/dL — ABNORMAL LOW (ref 12.0–15.0)
Hemoglobin: 11.9 g/dL — ABNORMAL LOW (ref 12.0–15.0)
Hemoglobin: 12.9 g/dL (ref 12.0–15.0)
MCH: 31.3 pg (ref 26.0–34.0)
MCH: 31.9 pg (ref 26.0–34.0)
MCHC: 33.6 g/dL (ref 30.0–36.0)
MCV: 93 fL (ref 78.0–100.0)
MCV: 94.3 fL (ref 78.0–100.0)
Platelets: 193 10*3/uL (ref 150–400)
Platelets: 219 10*3/uL (ref 150–400)
RBC: 3.4 MIL/uL — ABNORMAL LOW (ref 3.87–5.11)
RBC: 3.8 MIL/uL — ABNORMAL LOW (ref 3.87–5.11)
RBC: 4.11 MIL/uL (ref 3.87–5.11)
RDW: 14.3 % (ref 11.5–15.5)
WBC: 6 10*3/uL (ref 4.0–10.5)
WBC: 7 10*3/uL (ref 4.0–10.5)

## 2011-01-31 LAB — CONVERTED CEMR LAB: Anti Nuclear Antibody(ANA): POSITIVE — AB

## 2011-01-31 LAB — OVA AND PARASITE EXAMINATION: Ova and parasites: NONE SEEN

## 2011-01-31 LAB — BASIC METABOLIC PANEL
CO2: 24 mEq/L (ref 19–32)
Chloride: 111 mEq/L (ref 96–112)
Glucose, Bld: 101 mg/dL — ABNORMAL HIGH (ref 70–99)
Potassium: 3.8 mEq/L (ref 3.5–5.1)
Sodium: 138 mEq/L (ref 135–145)

## 2011-01-31 LAB — HEPATIC FUNCTION PANEL
Alkaline Phosphatase: 116 U/L (ref 39–117)
Indirect Bilirubin: 0.4 mg/dL (ref 0.3–0.9)
Total Bilirubin: 0.5 mg/dL (ref 0.3–1.2)

## 2011-01-31 LAB — STOOL CULTURE

## 2011-01-31 LAB — URINALYSIS, ROUTINE W REFLEX MICROSCOPIC
Bilirubin Urine: NEGATIVE
Hgb urine dipstick: NEGATIVE
Specific Gravity, Urine: 1.015 (ref 1.005–1.030)
pH: 6.5 (ref 5.0–8.0)

## 2011-01-31 LAB — HEPATITIS PANEL, ACUTE
Hep A IgM: NEGATIVE
Hep B C IgM: NEGATIVE

## 2011-01-31 LAB — ACETAMINOPHEN LEVEL: Acetaminophen (Tylenol), Serum: <10 ug/mL — ABNORMAL LOW (ref 10–30)

## 2011-01-31 LAB — URINE CULTURE

## 2011-01-31 LAB — URINE MICROSCOPIC-ADD ON

## 2011-01-31 LAB — HEMOCCULT GUIAC POC 1CARD (OFFICE): Fecal Occult Bld: NEGATIVE

## 2011-01-31 LAB — CLOSTRIDIUM DIFFICILE EIA

## 2011-02-06 NOTE — Assessment & Plan Note (Signed)
Summary: Stephanie Stephens   Copy to:  A.V.Blount  Primary Provider/Referring Provider:  Pershing Proud / [facility]  CC:  Follow up visit-sarcoidosis.Marland Kitchen  History of Present Illness: March 08, 2010- Sarcoid, rhinoconjunctivitis Pollen makes eyes and nose water. Stable exercise tolerance with dyspnea. Blames Abilify for weight gain and abdominal obesity, so she stopped it. Using her neb about twice daily now. Uses Flovent and Ventolin daily. We reviewed these again. Denies fever, nodes, chest pain. palpitation, rash.  September 04, 2010- Sarcoid, rhinoconjunctivitis, GERD Nurse CC: 6 month follow up visit-asthma; doing good overall. Used her ventolin more during the summer heat, more because she didn't know what else to do but not really wheezey. Now chest is comfortable. Sometimes feels tight in chest and will lie on side to feel better than when on stomach. Feels strangled in throat if lies on stomach.  With Fall season she is having more nasal itching, sneezing and blowing.  CXR 08/04/09- stable scarring, NAD ACE 11/09/09- 67 (9-67).   January 26, 2011- Sarcoid, rhinoconjunctivitis, GERD Nurse-CC: Follow up visit-sarcoidosis. CXR 10/17/11IMPRESSION: Unchanged scarring and chronic ground-glass airspace opacities detailed above.  No interval change. Read By:  Henrene Pastor,  M.D. Hosp w/ syncope in October after flu vax.  Reports more short of breath since late Decmber, gradually noticed, rest and exertion. Sleep up on 2 pillows, but not bothered in sleep. . Denies chest pain, cough or wheeze. Heart beat gets fast- thinks she gets panicked. Was ER w/ swollen left foot January, [facility] No labs done.. Weight varies, sweats, ? fever.       Preventive Screening-Counseling & Management  Alcohol-Tobacco     Smoking Status: quit     Packs/Day: ?     Year Started: 10 years     Year Quit: 2007     Pack years: occ for 4 years  1 pack a week  Current Medications (verified): 1)  Ventolin Hfa 108  (90 Base) Mcg/act Aers (Albuterol Sulfate) .... 2 Puffs Four Times A Day As Needed 2)  Flovent Hfa 220 Mcg/act  Aero (Fluticasone Propionate  Hfa) .... 2 Inhalations Two Times A Day 3)  Albuterol Sulfate (2.5 Mg/2ml) 0.083%  Nebu (Albuterol Sulfate) .Marland Kitchen.. 1 Neb Four Times A Day Prn  Allergies (verified): 1)  ! Pcn 2)  ! Cipro 3)  ! * Latex  Past History:  Past Medical History: Last updated: 12/26/2007 Asthma Sarcoidosis Diverticulosis colon polyps  Past Surgical History: Last updated: 08/04/2009 C-section x 3 lap- cholecystectomy hernia repair x 4 tonsillectomy Hysterectomy- fibroids  Family History: Last updated: 01/27/2008 Mother- ovarian cancer, colon polyps sister- breast cancer Father - colon cancer  Social History: Last updated: 08/04/2009 Patient states former smoker. Son incarcerated  Disability due to sarcoid Studying criminal justice on-line course  Risk Factors: Smoking Status: quit (01/26/2011) Packs/Day: ? (01/26/2011)  Review of Systems      See HPI       The patient complains of chest pain and dyspnea on exertion.  The patient denies anorexia, fever, weight loss, weight gain, vision loss, decreased hearing, hoarseness, syncope, peripheral edema, prolonged cough, headaches, hemoptysis, abdominal pain, melena, severe indigestion/heartburn, abnormal bleeding, and enlarged lymph nodes.         Vague soreness across upper anterior chest wall  Vital Signs:  Patient profile:   45 year old female Height:      64 inches Weight:      158.13 pounds BMI:     27.24 O2 Sat:  91 % on Room air Pulse rate:   94 / minute BP sitting:   108 / 66  (left arm) Cuff size:   regular  Vitals Entered By: Reynaldo Minium CMA (January 26, 2011 9:16 AM)  O2 Flow:  Room air CC: Follow up visit-sarcoidosis.   Physical Exam  Additional Exam:  General: A/Ox3; pleasant and cooperative, NAD, SKIN: fclear NODES:  fullness of supraclavicular nodes. HEENT: [place]/AT, EOM-  WNL, Conjuctivae- clear, PERRLA, TM-WNL, Nose- turbinate edema, yellow, Throat- clear and wnl, Mallampati  II-III NECK: Supple w/ fair ROM, JVD- none, normal carotid impulses w/o bruits Thyroid- not enlarged CHEST: Clear to P&A, , unlabored HEART: RRR, no m/g/r heard ABDOMEN: Soft and nl;  ZOX:WRUE, nl pulses, no edema , neg Homan's. Foot is not swollen or tender now.  NEURO: Grossly intact to observation      Impression & Recommendations:  Problem # 1:  PULMONARY SARCOIDOSIS (ICD-135) ACE was high enough that some sarcoid activity and perimenopause could explain symptoms. With palpitations, sweats, dyspnea and variable weight, I will check Ddimer to exclude VTE, noting saturation 91%, and recheck ACE for trend.   Problem # 2:  ASTHMA (ICD-493.90) Not much wheeze now. She still has her Flovent and bronchodilators.   Medications Added to Medication List This Visit: 1)  Proair Hfa 108 (90 Base) Mcg/act Aers (Albuterol sulfate) .... 2 puffs four times a day as needed rescue inhaler  Other Orders: Est. Patient Level III (45409) T-2 View CXR (71020TC) TLB-CBC Platelet - w/Differential (85025-CBCD) TLB-BMP (Basic Metabolic Panel-BMET) (80048-METABOL) TLB-Hepatic/Liver Function Pnl (80076-HEPATIC) TLB-TSH (Thyroid Stimulating Hormone) (84443-TSH) TLB-Sedimentation Rate (ESR) (85652-ESR) T-D-Dimer Fibrin Derivatives Quantitive [phone]) T-Antinuclear Antib (ANA) ([id number])  Patient Instructions: 1)  Please schedule a follow-up appointment in 4 months. Please call sooner if needed 2)  Labs 3)  A chest x-ray has been recommended.  Your imaging study may require preauthorization.  Prescriptions: FLOVENT HFA 220 MCG/ACT  AERO (FLUTICASONE PROPIONATE  HFA) 2 inhalations two times a day  #12 Gram x prn   Entered and Authorized by:   Waymon Budge MD   Signed by:   Waymon Budge MD on 01/26/2011   Method used:   Electronically to        [facility] [phone]* (retail)        [address]       [place], [place]  [address]       Ph: [phone]       Fax: [phone]   RxID:   [id number] PROAIR HFA 108 (90 BASE) MCG/ACT AERS (ALBUTEROL SULFATE) 2 puffs four times a day as needed rescue inhaler  #1 x prn   Entered and Authorized by:   Waymon Budge MD   Signed by:   Waymon Budge MD on 01/26/2011   Method used:   Electronically to        [facility] [address] [phone]* (retail)       [address]       [place], [place]  [address]       Ph: [phone]       Fax: [phone]   RxID:   [id number]

## 2011-02-13 ENCOUNTER — Telehealth: Payer: Self-pay | Admitting: Internal Medicine

## 2011-02-13 NOTE — Telephone Encounter (Signed)
Spoke with pt and she states [facility] was faxing over a paper for Dr. Maple Hudson to fill out to refill her albuterol nebulizer. Pt states she is completely out. Katie please advise if you have seen any paper work on this patient for her albuterol neb. Thanks  Carver Fila, CMA

## 2011-02-13 NOTE — Telephone Encounter (Signed)
I have not seen any paperwork from [facility] regarding pts meds; please ask [facility] to resend. Thanks.

## 2011-02-14 ENCOUNTER — Telehealth: Payer: Self-pay | Admitting: Internal Medicine

## 2011-02-14 NOTE — Telephone Encounter (Signed)
Called [facility] spoke with Stephanie Stephens and requested the CMN to be refaxed.  Gave her the triage fax number.  Will give Stephanie Stephens when this arrives.  CMN received - will forward to CDY.  CMN placed on CDY's cart.

## 2011-02-14 NOTE — Telephone Encounter (Signed)
Per prior phone note, Florentina Addison has faxed form back to [facility] regarding Albuterol nebs and wanted pt to be aware. Pt had verbal understanding that this had been done and will call if she has any other questions or problems.

## 2011-02-14 NOTE — Telephone Encounter (Signed)
I have faxed the signed information back to [facility]. I called the patient and she is aware that the CMN has been faxed back to [facility] via a voicemail.Vivianne Spence

## 2011-04-06 NOTE — Assessment & Plan Note (Signed)
[facility]                             PULMONARY OFFICE NOTE   Stephanie Stephens, Stephanie Stephens                      MRN:          [id number]  DATE:01/28/2007                            DOB:          Aug 26, 1966    PROBLEM:  1. Pulmonary sarcoid.  2. History of asthma.   HISTORY:  First visit in almost a year.  She complains of persistent  nasal congestion which has gone on through the winter, beginning as  nasal stuffiness with anosmia, some frontal headache, only clear nasal  discharge.  She does not remember having any purulent nasal discharge.  She recently bought a bottle of Nasalcrom and has used that just a  couple of times.  Watery eyes and sneezing.  She denies fever,  adenopathy, purulent or bloody discharge and she denies HIV exposure  risk.  A somewhat grainy area of rash on the lumbar back has been  noticed in the last few weeks.  No change in bowel or bladder.   MEDICATION:  1. Flovent 220 inhaler 2 puffs b.i.d.  2. Albuterol inhaler used p.r.n.  3. Home nebulizer with albuterol rarely needed.  4. Nasalcrom.   Drug intolerant to PENICILLIN and possibly LATEX.   OBJECTIVE:  Weight 148 pounds.  Blood pressure 110/62.  Pulse 103.  Room  air saturation 99%.  Congested cough which she attributes to a cold starting in the last day  or so, on top of her other complaints.  There is nasal congestion with  turbinate edema and some clear mucus.  Conjunctivae are clear.  I do not  find adenopathy or hepatosplenomegaly.  HEART:  Sounds are regular without murmur.  There is minimal roughness to the skin over the lumbar spine without  pigment change or excoriation.   Chest x-ray:  I do not have a comparison film here.  There is some  linear scarring and probably fibrocystic change, especially in the right  apex.  I am more concerned about a diffuse acinar pattern across the  lower lung zones.  There is right peritracheal adenopathy.  Her chest  film  from March 12, 2006 described mediastinal adenopathy and bilateral  upper lobe disease.   IMPRESSION:  1. Persistent rhinitis through the winter.  2. What she thinks is an acute viral syndrome upper respiratory      infection in just the last few days.  3. History of sarcoid with normal liver function and renal function on      blood work March 13, 2006.  4. Abnormal chest x-ray.  I cannot exclude a meliorate disease process      including miliary tuberculosis or pneumocystis, but more likely I      am seeing a diffuse inflammatory response related to her sarcoid or      possibly a viral pneumonitis.  This will need early follow up.   PLAN:  CBC with diff, chemistry profile, blood for angiotensin-  converting enzyme.  The chest x-ray is being sent out for over read.  Samples of Veramyst nasal spray twice each nostril.  Schedule return in  1  month, but earlier p.r.n.  I will await for the radiologist over read  report and consider bringing her in early for follow up blood work.     Clinton D. Maple Hudson, MD, Tonny Bollman, FACP  Electronically Signed    CDY/MedQ  DD: 01/28/2007  DT: 01/30/2007  Job #: [id number]   cc:   Carney Bern M.D.. [facility]
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
## 2011-04-28 ENCOUNTER — Encounter: Payer: Self-pay | Admitting: Gastroenterology

## 2011-05-15 ENCOUNTER — Telehealth: Payer: Self-pay | Admitting: Internal Medicine

## 2011-05-15 NOTE — Telephone Encounter (Signed)
I spoke with the patient and advised it looks like the letter was a recall letter from Dr. Arlyce Dice. I advised the pt to call Dr. Marzetta Board office to set appt for colonoscopy. Carron Curie, CMA

## 2011-05-24 ENCOUNTER — Encounter: Payer: Self-pay | Admitting: Gastroenterology

## 2011-06-21 ENCOUNTER — Telehealth: Payer: Self-pay | Admitting: *Deleted

## 2011-06-21 NOTE — Telephone Encounter (Signed)
Pt was a no show to previsit.  Attempted to reach pt several times, but unable to reach her or leave a message.  Letter mailed and appt for colonoscopy on 07-05-11 cancelled

## 2011-07-05 ENCOUNTER — Other Ambulatory Visit: Payer: Medicare Other | Admitting: Gastroenterology

## 2011-09-06 LAB — CULTURE, BLOOD (ROUTINE X 2)

## 2011-09-06 LAB — DIFFERENTIAL
Basophils Absolute: 0
Basophils Relative: 0
Basophils Relative: 0
Eosinophils Absolute: 0
Eosinophils Relative: 0
Eosinophils Relative: 0
Monocytes Absolute: 0 — ABNORMAL LOW
Monocytes Relative: 1 — ABNORMAL LOW
Monocytes Relative: 5
Neutro Abs: 4.3
Neutrophils Relative %: 86 — ABNORMAL HIGH

## 2011-09-06 LAB — COMPREHENSIVE METABOLIC PANEL
AST: 42 — ABNORMAL HIGH
Albumin: 3.3 — ABNORMAL LOW
Alkaline Phosphatase: 87
Chloride: 109
GFR calc Af Amer: >60
Potassium: 4.2
Sodium: 138
Total Bilirubin: 1.2
Total Protein: 6.6

## 2011-09-06 LAB — HEPATIC FUNCTION PANEL
ALT: 26
AST: 39 — ABNORMAL HIGH
Albumin: 3.7
Bilirubin, Direct: 0.3
Total Protein: 7.3

## 2011-09-06 LAB — I-STAT 8, (EC8 V) (CONVERTED LAB)
BUN: 7
Bicarbonate: 21.8
HCT: 41
Operator id: 234111
pCO2, Ven: 31.6 — ABNORMAL LOW
pH, Ven: 7.447 — ABNORMAL HIGH

## 2011-09-06 LAB — URINALYSIS, ROUTINE W REFLEX MICROSCOPIC
Bilirubin Urine: NEGATIVE
Ketones, ur: 15 — AB
Nitrite: POSITIVE — AB
Specific Gravity, Urine: 1.011
pH: 6

## 2011-09-06 LAB — CBC
HCT: 37.2
MCHC: 33.6
MCV: 96.1
Platelets: 233
Platelets: 253
RDW: 13.9
WBC: 5

## 2011-09-06 LAB — POCT I-STAT CREATININE
Creatinine, Ser: 1.1
Operator id: 234111

## 2011-09-06 LAB — URINE MICROSCOPIC-ADD ON

## 2011-09-19 ENCOUNTER — Emergency Department (HOSPITAL_COMMUNITY)
Admission: EM | Admit: 2011-09-19 | Discharge: 2011-09-19 | Disposition: A | Discharge status: Home / Self Care | Payer: Medicare Other | Attending: Emergency Medicine | Admitting: Emergency Medicine

## 2011-09-19 DIAGNOSIS — D869 Sarcoidosis, unspecified: Secondary | ICD-10-CM | POA: Insufficient documentation

## 2011-09-19 DIAGNOSIS — L0390 Cellulitis, unspecified: Secondary | ICD-10-CM | POA: Insufficient documentation

## 2011-09-19 DIAGNOSIS — J99 Respiratory disorders in diseases classified elsewhere: Secondary | ICD-10-CM | POA: Insufficient documentation

## 2011-09-19 DIAGNOSIS — L0291 Cutaneous abscess, unspecified: Secondary | ICD-10-CM | POA: Insufficient documentation

## 2011-09-19 DIAGNOSIS — L02219 Cutaneous abscess of trunk, unspecified: Secondary | ICD-10-CM | POA: Insufficient documentation

## 2011-09-22 ENCOUNTER — Emergency Department (HOSPITAL_COMMUNITY)
Admission: EM | Admit: 2011-09-22 | Discharge: 2011-09-22 | Disposition: A | Discharge status: Home / Self Care | Payer: Medicare Other | Attending: Emergency Medicine | Admitting: Emergency Medicine

## 2011-09-22 DIAGNOSIS — J45909 Unspecified asthma, uncomplicated: Secondary | ICD-10-CM | POA: Insufficient documentation

## 2011-09-22 DIAGNOSIS — D869 Sarcoidosis, unspecified: Secondary | ICD-10-CM | POA: Insufficient documentation

## 2011-09-22 DIAGNOSIS — Z9889 Other specified postprocedural states: Secondary | ICD-10-CM | POA: Insufficient documentation

## 2011-09-22 DIAGNOSIS — Z4800 Encounter for change or removal of nonsurgical wound dressing: Secondary | ICD-10-CM | POA: Insufficient documentation

## 2011-09-22 DIAGNOSIS — L03319 Cellulitis of trunk, unspecified: Secondary | ICD-10-CM | POA: Insufficient documentation

## 2011-09-22 DIAGNOSIS — L02219 Cutaneous abscess of trunk, unspecified: Secondary | ICD-10-CM | POA: Insufficient documentation

## 2012-03-03 ENCOUNTER — Encounter: Payer: Self-pay | Admitting: *Deleted

## 2012-03-03 ENCOUNTER — Telehealth: Payer: Self-pay | Admitting: Internal Medicine

## 2012-03-03 ENCOUNTER — Ambulatory Visit: Payer: Medicare Other | Admitting: Internal Medicine

## 2012-03-03 NOTE — Telephone Encounter (Signed)
I spoke with the pt and she is c/o increased SOB, difficulty sleeping, increased chest congestion, nasal congestion and chest tightness all x 2 days. She sounded winded on the phone while talking to me. Per Katie offer pt today at 2:15. Pt ok with appt. Carron Curie, CMA

## 2012-04-11 ENCOUNTER — Encounter: Payer: Self-pay | Admitting: *Deleted

## 2012-04-11 ENCOUNTER — Encounter: Payer: Self-pay | Admitting: Internal Medicine

## 2012-04-11 ENCOUNTER — Ambulatory Visit (INDEPENDENT_AMBULATORY_CARE_PROVIDER_SITE_OTHER): Payer: Medicare Other | Admitting: Internal Medicine

## 2012-04-11 VITALS — BP 110/68 | HR 79 | Ht 64.0 in | Wt 158.0 lb

## 2012-04-11 DIAGNOSIS — D869 Sarcoidosis, unspecified: Secondary | ICD-10-CM

## 2012-04-11 DIAGNOSIS — J40 Bronchitis, not specified as acute or chronic: Secondary | ICD-10-CM

## 2012-04-11 MED ORDER — FLUTICASONE PROPIONATE HFA 220 MCG/ACT IN AERO: 2.0000 | INHALATION_SPRAY | Freq: Two times a day (BID) | RESPIRATORY_TRACT | Status: DC

## 2012-04-11 MED ORDER — ALBUTEROL SULFATE HFA 108 (90 BASE) MCG/ACT IN AERS: 2.0000 | INHALATION_SPRAY | RESPIRATORY_TRACT | Status: DC | PRN

## 2012-04-11 NOTE — Progress Notes (Signed)
04/11/12- 39 yoF former smoker followed for sarcoid, allergic rhinitis, allergic conjunctivitis, complicated by GERD LOV-01/26/2011 Smokes on and off "stress" after her aunt died. Or shortness of breath in the last month attributed to pollen bothering her asthma. Mild wheeze. Used her nebulizer. Incidentally took prednisone for gout in her foot, which also helped her asthma. Chest x-ray 02/05/2011-stable Labs from 01/26/2011 reviewed. Unremarkable. Good liver and renal function  ROS-see HPI Constitutional:   No-   weight loss, night sweats, fevers, chills, fatigue, lassitude. HEENT:   No-  headaches, difficulty swallowing, tooth/dental problems, sore throat,       No-  sneezing, itching, ear ache, nasal congestion, post nasal drip,  CV:  No-   chest pain, orthopnea, PND, swelling in lower extremities, anasarca,  dizziness, palpitations Resp: +  shortness of breath with exertion or at rest.              No-   productive cough,  No non-productive cough,  No- coughing up of blood.              No-   change in color of mucus.  No- wheezing.   Skin: No-   rash or lesions. GI:  No-   heartburn, indigestion, abdominal pain, nausea, vomiting,  GU:  MS:  No-   joint pain or swelling. Neuro-     nothing unusual Psych:  No- change in mood or affect. No depression or anxiety.  No memory loss.  OBJ- Physical Exam General- Alert, Oriented, Affect-appropriate, Distress- none acute Skin- rash-none, lesions- none, excoriation- none Lymphadenopathy- none Head- atraumatic            Eyes- Gross vision intact, PERRLA, conjunctivae and secretions clear            Ears- Hearing, canals-normal            Nose- Clear, no-Septal dev, mucus, polyps, erosion, perforation             Throat- Mallampati II , mucosa clear , drainage- none, tonsils- atrophic Neck- flexible , trachea midline, no stridor , thyroid nl, carotid no bruit Chest - symmetrical excursion , unlabored           Heart/CV- RRR , no murmur , no  gallop  , no rub, nl s1 s2                           - JVD- none , edema- none, stasis changes- none, varices- none           Lung- clear to P&A, wheeze- none, cough- none , dullness-none, rub- none           Chest wall-  Abd- tender-no, distended-no, bowel sounds-present, HSM- no Br/ Gen/ Rectal- Not done, not indicated Extrem- cyanosis- none, clubbing, none, atrophy- none, strength- nl Neuro- grossly intact to observation

## 2012-04-11 NOTE — Patient Instructions (Signed)
Please call as needed for refills   Work note for today  Scripts sent for [employer] and albuterol  Please try to stop smoking.

## 2012-04-17 ENCOUNTER — Encounter: Payer: Self-pay | Admitting: Internal Medicine

## 2012-04-17 NOTE — Assessment & Plan Note (Signed)
Smoking cessation was counseled.

## 2012-04-17 NOTE — Assessment & Plan Note (Signed)
In clinical remission I explained that I was more concerned about her cigarette smoking liver function and renal chemistry last year showed no evidence of end organ involvement with sarcoid.

## 2012-08-07 ENCOUNTER — Encounter: Payer: Self-pay | Admitting: Gastroenterology

## 2012-09-27 ENCOUNTER — Emergency Department (HOSPITAL_COMMUNITY)
Admission: EM | Admit: 2012-09-27 | Discharge: 2012-09-27 | Disposition: A | Discharge status: Home / Self Care | Payer: Medicare Other | Attending: Emergency Medicine | Admitting: Emergency Medicine

## 2012-09-27 ENCOUNTER — Encounter (HOSPITAL_COMMUNITY): Payer: Self-pay | Admitting: *Deleted

## 2012-09-27 DIAGNOSIS — F17200 Nicotine dependence, unspecified, uncomplicated: Secondary | ICD-10-CM | POA: Insufficient documentation

## 2012-09-27 DIAGNOSIS — Z79899 Other long term (current) drug therapy: Secondary | ICD-10-CM | POA: Insufficient documentation

## 2012-09-27 DIAGNOSIS — Z8619 Personal history of other infectious and parasitic diseases: Secondary | ICD-10-CM | POA: Insufficient documentation

## 2012-09-27 DIAGNOSIS — J45909 Unspecified asthma, uncomplicated: Secondary | ICD-10-CM | POA: Insufficient documentation

## 2012-09-27 DIAGNOSIS — H02849 Edema of unspecified eye, unspecified eyelid: Secondary | ICD-10-CM | POA: Insufficient documentation

## 2012-09-27 DIAGNOSIS — Z8719 Personal history of other diseases of the digestive system: Secondary | ICD-10-CM | POA: Insufficient documentation

## 2012-09-27 MED ORDER — DEXAMETHASONE SODIUM PHOSPHATE 10 MG/ML IJ SOLN
10.0000 mg | Freq: Once | INTRAMUSCULAR | Status: AC
  Administered 2012-09-27: 10 mg via INTRAMUSCULAR
  Filled 2012-09-27: qty 1

## 2012-09-27 MED ORDER — PREDNISONE 50 MG PO TABS: 50.0000 mg | ORAL_TABLET | Freq: Every day | ORAL | Status: DC

## 2012-09-27 NOTE — ED Provider Notes (Signed)
History  This chart was scribed for Ebbie Ridge, non-physician practitioner, working with Loren Racer, MD by Bennett Scrape. This patient was seen in room WTR7/WTR7 and the patient's care was started at 12:30PM  CSN: [id number]  Arrival date & time 09/27/12  1228   First MD Initiated Contact with Patient 09/27/12 1230      No chief complaint on file.   The history is provided by the patient. No language interpreter was used.    Stephanie Stephens is a 46 y.o. female who presents to the Emergency Department complaining of gradual onset, gradually worsening, constant left eye swelling with associated insect bites to the face and back that she noticed after spending the night at a friend's house 2 nights ago. She denies eye redness and discharge. She denies trying any OTC medications PTA to improve symptoms. She denies visual disturbance, HA, CP, SOB and nausea as associated symptoms. She has a h/o asthma and diverticulitis. She is a current everyday smoker but denies alcohol use.  Past Medical History  Diagnosis Date  . Asthma   . Sarcoidosis   . Diverticulosis   . Colon polyps     Past Surgical History  Procedure Date  . Cesarean section     x 3  . Cholecystectomy     lap  . Hernia repair     x 4  . Tonsillectomy   . Vesicovaginal fistula closure w/ tah     fibroids    Family History  Problem Relation Age of Onset  . Ovarian cancer Mother   . Colon polyps Mother   . Breast cancer Sister   . Colon cancer Father     History  Substance Use Topics  . Smoking status: Current Some Day Smoker    Types: Cigarettes  . Smokeless tobacco: Not on file     Comment: was more of social smoker-stress  . Alcohol Use: No    No OB history provided.  Review of Systems  A complete 10 system review of systems was obtained and all systems are negative except as noted in the HPI and PMH.    Allergies  Ciprofloxacin; Latex; and Penicillins  Home Medications   Current  Outpatient Rx  Name  Route  Sig  Dispense  Refill  . ALBUTEROL SULFATE HFA 108 (90 BASE) MCG/ACT IN AERS   Inhalation   Inhale 2 puffs into the lungs every 4 (four) hours as needed. Asthma         . FLUTICASONE PROPIONATE  HFA 220 MCG/ACT IN AERO   Inhalation   Inhale 2 puffs into the lungs 2 (two) times daily. Rinse mouth         . ADULT MULTIVITAMIN W/MINERALS CH   Oral   Take 1 tablet by mouth daily.           There were no vitals taken for this visit.  Physical Exam  Nursing note and vitals reviewed. Constitutional: She is oriented to person, place, and time. She appears well-developed and well-nourished. No distress.  HENT:  Head: Normocephalic and atraumatic.  Eyes: Conjunctivae normal and EOM are normal. Pupils are equal, round, and reactive to light.       Left eye is edematous, no foreign bodies noted, no discharge noted  Neck: Normal range of motion. Neck supple. No tracheal deviation present.  Cardiovascular: Normal rate, regular rhythm and normal heart sounds.   No murmur heard. Pulmonary/Chest: Effort normal and breath sounds normal. No respiratory distress. She has  no wheezes. She has no rales.  Abdominal: Soft. There is no tenderness.  Musculoskeletal: Normal range of motion. She exhibits no edema.  Neurological: She is alert and oriented to person, place, and time. She has normal strength. No sensory deficit. She displays no seizure activity.  Skin: Skin is warm and dry.  Psychiatric: She has a normal mood and affect. Her behavior is normal.    ED Course  Procedures (including critical care time)  DIAGNOSTIC STUDIES: None performed.    COORDINATION OF CARE: 12:49 PM- Discussed discharge plan which includes prednisone and benadryl with pt and pt agreed to plan. Advised pt to use warm compresses on the area to reduce swelling.    This appears to be a reaction to some agent causing allergic type swelling to the L upper eyelid. The patient has no signs  of infection the eye.   MDM  I personally performed the services described in this documentation, which was scribed in my presence. The recorded information has been reviewed and considered.    Carlyle Dolly, PA-C 09/27/12 1259

## 2012-09-27 NOTE — ED Notes (Signed)
Pt reports waking up Friday morning with swollen left eye. Reports slight blurred vision on left eye.

## 2012-09-27 NOTE — ED Provider Notes (Signed)
Medical screening examination/treatment/procedure(s) were performed by non-physician practitioner and as supervising physician I was immediately available for consultation/collaboration.   Maureen Delatte, MD 09/27/12 1510 

## 2012-12-12 ENCOUNTER — Telehealth: Payer: Self-pay | Admitting: Internal Medicine

## 2012-12-12 ENCOUNTER — Ambulatory Visit: Payer: Medicare Other

## 2012-12-12 NOTE — Telephone Encounter (Signed)
Duplicate message. 

## 2012-12-12 NOTE — Telephone Encounter (Signed)
I spoke with pt. She is aware we will call her once we receive shipment for flu shots. Will hold in triage until this comes in.

## 2012-12-12 NOTE — Telephone Encounter (Signed)
Spoke with pt and scheduled flu shot for today at 3 pm Nothing further needed

## 2012-12-18 ENCOUNTER — Ambulatory Visit: Payer: Medicare Other

## 2013-04-07 ENCOUNTER — Telehealth: Payer: Self-pay | Admitting: Internal Medicine

## 2013-04-07 NOTE — Telephone Encounter (Signed)
Pt advised and will keep appt on 04-14-13. Carron Curie, CMA

## 2013-04-07 NOTE — Telephone Encounter (Signed)
Per CY-have pt keep OV with him on 04-14-13 as this is the first open slot or she can come in to see TP.

## 2013-04-07 NOTE — Telephone Encounter (Signed)
I scheduled patient to see CY on Tuesday 04-14-13 for follow up as its been a while since seen; pt is not happy with appt date/time and requests this message go directly to [facility] and he get her in sooner. CY please advise as you are booked. Thanks.

## 2013-04-07 NOTE — Telephone Encounter (Signed)
Spoke with patient-states her PCP did lab work on her and showed that her kidney function was not looking good. Pt states she has been having a lot of trouble with her breathing as well. Pt is due to visit her PCP again today and unsure what they will do.

## 2013-04-14 ENCOUNTER — Ambulatory Visit: Payer: Medicare Other | Admitting: Internal Medicine

## 2013-04-17 ENCOUNTER — Ambulatory Visit: Payer: Medicare Other | Admitting: Internal Medicine

## 2013-06-09 ENCOUNTER — Ambulatory Visit: Payer: Medicare Other | Admitting: Internal Medicine

## 2013-06-15 ENCOUNTER — Encounter: Payer: Self-pay | Admitting: Internal Medicine

## 2013-06-15 ENCOUNTER — Other Ambulatory Visit (INDEPENDENT_AMBULATORY_CARE_PROVIDER_SITE_OTHER): Payer: Medicare Other

## 2013-06-15 ENCOUNTER — Ambulatory Visit (INDEPENDENT_AMBULATORY_CARE_PROVIDER_SITE_OTHER): Payer: Medicare Other | Admitting: Internal Medicine

## 2013-06-15 ENCOUNTER — Telehealth: Payer: Self-pay | Admitting: Internal Medicine

## 2013-06-15 ENCOUNTER — Encounter: Payer: Self-pay | Admitting: *Deleted

## 2013-06-15 ENCOUNTER — Ambulatory Visit (INDEPENDENT_AMBULATORY_CARE_PROVIDER_SITE_OTHER)
Admission: RE | Admit: 2013-06-15 | Discharge: 2013-06-15 | Disposition: A | Discharge status: Home / Self Care | Payer: Medicare Other | Source: Ambulatory Visit | Attending: Internal Medicine | Admitting: Internal Medicine

## 2013-06-15 VITALS — BP 110/84 | HR 71 | Ht 64.0 in | Wt 153.2 lb

## 2013-06-15 DIAGNOSIS — D869 Sarcoidosis, unspecified: Secondary | ICD-10-CM

## 2013-06-15 DIAGNOSIS — F17200 Nicotine dependence, unspecified, uncomplicated: Secondary | ICD-10-CM

## 2013-06-15 LAB — CBC WITH DIFFERENTIAL/PLATELET
Eosinophils Relative: 3.3 % (ref 0.0–5.0)
Monocytes Relative: 5.8 % (ref 3.0–12.0)
Neutrophils Relative %: 47.1 % (ref 43.0–77.0)
Platelets: 260 10*3/uL (ref 150.0–400.0)
WBC: 5.3 10*3/uL (ref 4.5–10.5)

## 2013-06-15 LAB — COMPREHENSIVE METABOLIC PANEL
Albumin: 4.1 g/dL (ref 3.5–5.2)
Alkaline Phosphatase: 69 U/L (ref 39–117)
CO2: 29 mEq/L (ref 19–32)
GFR: 96.48 mL/min (ref >60.00)
Glucose, Bld: 90 mg/dL (ref 70–99)
Potassium: 4.4 mEq/L (ref 3.5–5.1)
Sodium: 140 mEq/L (ref 135–145)
Total Protein: 7.5 g/dL (ref 6.0–8.3)

## 2013-06-15 LAB — ANGIOTENSIN CONVERTING ENZYME: Angiotensin-Converting Enzyme: 56 U/L — ABNORMAL HIGH (ref 8–52)

## 2013-06-15 NOTE — Telephone Encounter (Signed)
Notes Recorded by Waymon Budge, MD on 06/15/2013 at 1:24 PM CXR- unchanged changes of chronic sarcoid. Not obviously any worse. We are waiting on lab results.  I spoke with patient about results and she verbalized understanding and had no questions

## 2013-06-15 NOTE — Progress Notes (Signed)
04/11/12- 40 yoF former smoker followed for sarcoid, allergic rhinitis, allergic conjunctivitis, complicated by GERD LOV-01/26/2011 Smokes on and off "stress" after her aunt died. Or shortness of breath in the last month attributed to pollen bothering her asthma. Mild wheeze. Used her nebulizer. Incidentally took prednisone for gout in her foot, which also helped her asthma. Chest x-ray 02/05/2011-stable Labs from 01/26/2011 reviewed. Unremarkable. Good liver and renal function  06/15/13-  45 yoF  smoker followed for sarcoid, allergic rhinitis, allergic conjunctivitis, complicated by GERD FOLLOWS FOR: pt reports she is more short of breath, vision is becoming more blurring, increasing joint pain, and scratchy throat (has been smoking "some") Admits smoking 2 packs per week. Had labs at her PCP in [place], where she lives, raising question of sarcoid activity. She does not have those labs and asks that we repeat. No real change in occasional night sweats since menopause. No adenopathy or rash. Some shortness of breath, scratchy throat, aching of knees and hips.  ROS-see HPI Constitutional:   No-   weight loss, +night sweats, fevers, chills, fatigue, lassitude. HEENT:   No-  headaches, difficulty swallowing, tooth/dental problems, sore throat,       No-  sneezing, itching, ear ache, nasal congestion, post nasal drip,  CV:  No-   chest pain, orthopnea, PND, swelling in lower extremities, anasarca,  dizziness, palpitations Resp: +  shortness of breath with exertion or at rest.              No-   productive cough,  + non-productive cough,  No- coughing up of blood.              No-   change in color of mucus.  No- wheezing.   Skin: No-   rash or lesions. GI:  No-   heartburn, indigestion, abdominal pain, nausea, vomiting,  GU:  MS:  + joint pain or swelling. Neuro-     nothing unusual Psych:  No- change in mood or affect. No depression or anxiety.  No memory loss.  OBJ- Physical Exam General-  Alert, Oriented, Affect-appropriate, Distress- none acute Skin- rash-none, lesions- none, excoriation- none Lymphadenopathy- none Head- atraumatic            Eyes- Gross vision intact, PERRLA, conjunctivae and secretions clear            Ears- Hearing, canals-normal            Nose- Clear, no-Septal dev, mucus, polyps, erosion, perforation             Throat- Mallampati III , mucosa clear , drainage- none, tonsils- atrophic Neck- flexible , trachea midline, no stridor , thyroid nl, carotid no bruit Chest - symmetrical excursion , unlabored           Heart/CV- RRR , no murmur , no gallop  , no rub, nl s1 s2                           - JVD- none , edema- none, stasis changes- none, varices- none           Lung- clear to P&A, wheeze- none, cough- none , dullness-none, rub- none           Chest wall-  Abd-  HSM- no Br/ Gen/ Rectal- Not done, not indicated Extrem- cyanosis- none, clubbing, none, atrophy- none, strength- nl Neuro- grossly intact to observation

## 2013-06-15 NOTE — Patient Instructions (Addendum)
Order- CXR  Dx sarcoid  Order- lab  CBC w/ diff, CMET, ACE level, sed rate  Dx sarcoid  Please try not to smoke at all  Please call as needed

## 2013-06-27 DIAGNOSIS — F17210 Nicotine dependence, cigarettes, uncomplicated: Secondary | ICD-10-CM | POA: Insufficient documentation

## 2013-06-27 NOTE — Assessment & Plan Note (Signed)
Nonspecific symptoms. We will assess for activity Plan-sedimentation rate, ACE level, liver and kidney chemistries, chest x-ray

## 2013-06-27 NOTE — Assessment & Plan Note (Signed)
We discussed the importance of not smoking at all. I think she can be motivated to quit.

## 2013-09-14 ENCOUNTER — Telehealth: Payer: Self-pay | Admitting: Internal Medicine

## 2013-09-14 DIAGNOSIS — D869 Sarcoidosis, unspecified: Secondary | ICD-10-CM

## 2013-09-14 DIAGNOSIS — M549 Dorsalgia, unspecified: Secondary | ICD-10-CM

## 2013-09-14 NOTE — Telephone Encounter (Signed)
Pt advised and order placed. Artemis Loyal, CMA  

## 2013-09-14 NOTE — Telephone Encounter (Signed)
Per CY-order CXR dx back pain, sarcoidosis Thanks.

## 2013-09-14 NOTE — Telephone Encounter (Signed)
Last visit 06-15-13. Pt states x 5 days she has been having a sharp pain in the center of her back when she inhales. She states this has gotten worse over the last 5 days. She is also having increased SOB. Pt sounded winded over the phone.  Pt states she has been using flovent inhaler without relief. Pt is requesting an appt if available or rx called in. Pt feels this is due to her sarcoid. Please advise. Carron Curie, CMA Allergies  Allergen Reactions  . Ciprofloxacin Swelling  . Latex Rash  . Penicillins Rash

## 2013-09-21 ENCOUNTER — Ambulatory Visit (INDEPENDENT_AMBULATORY_CARE_PROVIDER_SITE_OTHER)
Admission: RE | Admit: 2013-09-21 | Discharge: 2013-09-21 | Disposition: A | Discharge status: Home / Self Care | Payer: Medicare Other | Source: Ambulatory Visit | Attending: Internal Medicine | Admitting: Internal Medicine

## 2013-09-21 DIAGNOSIS — D869 Sarcoidosis, unspecified: Secondary | ICD-10-CM

## 2013-09-21 DIAGNOSIS — M549 Dorsalgia, unspecified: Secondary | ICD-10-CM

## 2013-09-23 ENCOUNTER — Telehealth: Payer: Self-pay | Admitting: Internal Medicine

## 2013-09-23 MED ORDER — CLARITHROMYCIN 500 MG PO TABS: 500.0000 mg | ORAL_TABLET | Freq: Two times a day (BID) | ORAL | Status: DC

## 2013-09-23 NOTE — Telephone Encounter (Signed)
Result Notes    Notes Recorded by Waymon Budge, MD on 09/22/2013 at 9:01 AM CXR looks like pneumonia. Suggest we call in biaxin 500 mg # 14, 1 twice daily. Ask how she is doing now. We will want to see f/u CXR in 2 weeks for dx pneumonia. If any question, please work her in  --  I spoke with patient about results and she verbalized understanding and had no questions. RX sent in for pt. She has appt scheduled for 10/06/13 at 11:15. Pt aware will have CXR done and to arrive 15 min early. Pt reports she still feels the same. Pt is also requesting to speak with Dr. Maple Hudson personally. She has several questions and concerns she wants to speak with him about. Please advise Dr. Maple Hudson thanks

## 2013-09-25 ENCOUNTER — Telehealth: Payer: Self-pay | Admitting: Internal Medicine

## 2013-09-25 DIAGNOSIS — J42 Unspecified chronic bronchitis: Secondary | ICD-10-CM

## 2013-09-25 DIAGNOSIS — D869 Sarcoidosis, unspecified: Secondary | ICD-10-CM

## 2013-09-25 NOTE — Telephone Encounter (Signed)
Called and spoke with pt and she stated that she is not on cpap.  She is needing supplies and the albuterol for her nebulizer. She stated that she is out and needs this sent to the [facility] in [place].  CY please advise if ok to send this order in for the pt.  Thanks   Last ov--06/15/2013 Next ov--10/06/2013    Allergies  Allergen Reactions  . Ciprofloxacin Swelling  . Latex Rash  . Penicillins Rash     Current Outpatient Prescriptions on File Prior to Visit  Medication Sig Dispense Refill  . albuterol (PROVENTIL HFA;VENTOLIN HFA) 108 (90 BASE) MCG/ACT inhaler Inhale 2 puffs into the lungs every 4 (four) hours as needed. Asthma      . clarithromycin (BIAXIN) 500 MG tablet Take 1 tablet (500 mg total) by mouth 2 (two) times daily.  14 tablet  0  . fluticasone (FLOVENT HFA) 220 MCG/ACT inhaler Inhale 2 puffs into the lungs 2 (two) times daily. Rinse mouth      . Multiple Vitamin (MULTIVITAMIN WITH MINERALS) TABS Take 1 tablet by mouth daily.       No current facility-administered medications on file prior to visit.

## 2013-09-25 NOTE — Telephone Encounter (Signed)
Ok Order- [facility]- nebulizer supplies, and script for albuterol 0.083% neb solution, # 120, 1 neb 4 x daily prn, ref prn for dx Sarcoid, chronic bronchitis

## 2013-09-25 NOTE — Telephone Encounter (Signed)
Order has been sent to [facility] and message has been sent to [facility]

## 2013-09-28 ENCOUNTER — Encounter: Payer: Self-pay | Admitting: *Deleted

## 2013-09-28 ENCOUNTER — Encounter: Payer: Self-pay | Admitting: Internal Medicine

## 2013-09-28 ENCOUNTER — Ambulatory Visit (INDEPENDENT_AMBULATORY_CARE_PROVIDER_SITE_OTHER): Payer: Medicare Other

## 2013-09-28 ENCOUNTER — Ambulatory Visit (INDEPENDENT_AMBULATORY_CARE_PROVIDER_SITE_OTHER)
Admission: RE | Admit: 2013-09-28 | Discharge: 2013-09-28 | Disposition: A | Discharge status: Home / Self Care | Payer: Medicare Other | Source: Ambulatory Visit | Attending: Internal Medicine | Admitting: Internal Medicine

## 2013-09-28 ENCOUNTER — Ambulatory Visit (INDEPENDENT_AMBULATORY_CARE_PROVIDER_SITE_OTHER): Payer: Medicare Other | Admitting: Internal Medicine

## 2013-09-28 VITALS — BP 132/80 | HR 90 | Ht 64.0 in | Wt 150.4 lb

## 2013-09-28 DIAGNOSIS — D869 Sarcoidosis, unspecified: Secondary | ICD-10-CM

## 2013-09-28 DIAGNOSIS — R0600 Dyspnea, unspecified: Secondary | ICD-10-CM

## 2013-09-28 DIAGNOSIS — J189 Pneumonia, unspecified organism: Secondary | ICD-10-CM

## 2013-09-28 DIAGNOSIS — R0609 Other forms of dyspnea: Secondary | ICD-10-CM

## 2013-09-28 LAB — CBC WITH DIFFERENTIAL/PLATELET
Basophils Relative: 0.6 % (ref 0.0–3.0)
Eosinophils Absolute: 0.2 10*3/uL (ref 0.0–0.7)
HCT: 39.1 % (ref 36.0–46.0)
Lymphs Abs: 3.2 10*3/uL (ref 0.7–4.0)
MCHC: 33.2 g/dL (ref 30.0–36.0)
MCV: 94.4 fl (ref 78.0–100.0)
Monocytes Absolute: 0.6 10*3/uL (ref 0.1–1.0)
Neutrophils Relative %: 41 % — ABNORMAL LOW (ref 43.0–77.0)
RBC: 4.14 Mil/uL (ref 3.87–5.11)

## 2013-09-28 MED ORDER — PREDNISONE 10 MG PO TABS: ORAL_TABLET | ORAL | Status: DC

## 2013-09-28 NOTE — Telephone Encounter (Signed)
Please advise if this has been taken care of? thanks 

## 2013-09-28 NOTE — Patient Instructions (Addendum)
Order lab- D-dimer, CBC w/ diff, ACE     Dx dyspnea, sarcoid  Order- CXR   Dx sarcoid, pneumonia  LOA for 11/6 to return 10/01/13

## 2013-09-28 NOTE — Progress Notes (Signed)
04/11/12- 33 yoF former smoker followed for sarcoid, allergic rhinitis, allergic conjunctivitis, complicated by GERD LOV-01/26/2011 Smokes on and off "stress" after her aunt died. Or shortness of breath in the last month attributed to pollen bothering her asthma. Mild wheeze. Used her nebulizer. Incidentally took prednisone for gout in her foot, which also helped her asthma. Chest x-ray 02/05/2011-stable Labs from 01/26/2011 reviewed. Unremarkable. Good liver and renal function  06/15/13-  45 yoF  smoker followed for sarcoid, allergic rhinitis, allergic conjunctivitis, complicated by GERD FOLLOWS FOR: pt reports she is more short of breath, vision is becoming more blurring, increasing joint pain, and scratchy throat (has been smoking "some") Admits smoking 2 packs per week. Had labs at her PCP in [place], where she lives, raising question of sarcoid activity. She does not have those labs and asks that we repeat. No real change in occasional night sweats since menopause. No adenopathy or rash. Some shortness of breath, scratchy throat, aching of knees and hips.  09/28/13- 47 yoF  smoker followed for sarcoid, allergic rhinitis, allergic conjunctivitis, complicated by GERD ACUTE VISIT: just not feeling well; started abx on 09-23-13 for pna per CXR. Continues to have SOB and fatigued. On 09/14/13 she called reporting sharp back pain, malaise.  CXR 11/3> RLL pneumonia. We called in Biaxin, refilled neb albuterol Rx. C/O L hip joing pain and aches both calves. Denies cough, fever, phlegm. Attributes sweats to her hot flashes.  Pain w/ deep breath across anterior chest under breasts x 2 weeks "sharp, sticking". ACE 05/2013- 56H CXR 09/14/13 IMPRESSION:  New medial right basilar infiltrate consistent with pneumonia.  Underlying emphysematous changes and right upper lobe scarring.  Electronically Signed  By: Ulyses Southward M.D.  On: 09/21/2013 15:44   ROS-see HPI Constitutional:   No-   weight loss, +night  sweats, fevers, chills, fatigue, lassitude. HEENT:   No-  headaches, difficulty swallowing, tooth/dental problems, sore throat,       No-  sneezing, itching, ear ache, nasal congestion, post nasal drip,  CV:  + chest pain, No-orthopnea, PND, swelling in lower extremities, anasarca,  dizziness, palpitations Resp: +  shortness of breath with exertion or at rest.              No-   productive cough,  No- non-productive cough,  No- coughing up of blood.              No-   change in color of mucus.  No- wheezing.   Skin: No-   rash or lesions. GI:  No-   heartburn, indigestion, abdominal pain, nausea, vomiting,  GU:  MS:  + joint pain or swelling. Neuro-     nothing unusual Psych:  No- change in mood or affect. No depression or anxiety.  No memory loss.  OBJ- Physical Exam General- Alert, Oriented, Affect-appropriate, Distress- none acute. +clammy/ diaphoretic "hot flashy" Skin- + minimal "rash" = slight hyperpigmentation upper ant chest, L upper arm. Lymphadenopathy-+ small posterior cervical nodes, non-tender Head- atraumatic            Eyes- Gross vision intact, PERRLA, conjunctivae and secretions clear            Ears- Hearing, canals-normal            Nose- Clear, no-Septal dev, mucus, polyps, erosion, perforation             Throat- Mallampati III , mucosa clear , drainage- none, tonsils- atrophic Neck- flexible , trachea midline, no stridor , thyroid nl, carotid no  bruit Chest - symmetrical excursion , unlabored           Heart/CV- RRR , no murmur , no gallop  , no rub, nl s1 s2                           - JVD- none , edema- none, stasis changes- none, varices- none           Lung- clear to P&A, wheeze- none, cough- none , dullness-none, rub- none           Chest wall-  Abd-  HSM- no Br/ Gen/ Rectal- Not done, not indicated Extrem- cyanosis- none, clubbing, none, atrophy- none, strength- nl Neuro- grossly intact to observation

## 2013-09-28 NOTE — Telephone Encounter (Signed)
Pt is being seen today 

## 2013-09-29 ENCOUNTER — Telehealth: Payer: Self-pay | Admitting: Internal Medicine

## 2013-09-29 NOTE — Telephone Encounter (Signed)
Notes Recorded by Waymon Budge, MD on 09/29/2013 at 8:16 AM CXR- persistent increased markings in lower right lung. This may be from sarcoid, but we need to continue to watch. CBC- white blood count is not elevated as I would expect with a bacterial pneumonia D-dimer test for blood clot is normal ACE test for sarcoid activity is not back yet  Pt advised. Carron Curie, CMA

## 2013-10-02 ENCOUNTER — Ambulatory Visit: Payer: Medicare Other | Admitting: Adult Health

## 2013-10-02 ENCOUNTER — Telehealth: Payer: Self-pay | Admitting: Internal Medicine

## 2013-10-02 DIAGNOSIS — J189 Pneumonia, unspecified organism: Secondary | ICD-10-CM | POA: Insufficient documentation

## 2013-10-02 NOTE — Telephone Encounter (Signed)
Per CDY: does TP have any openings?  Thanks.  TP with opening today at 2:15pm Called spoke with patient, offered ov w/ this TP afternoon Appt scheduled Will sign off.

## 2013-10-02 NOTE — Assessment & Plan Note (Signed)
ACE slightly elevated 56 in July.  Plan- recheck ACE, look for clot w/ D-dimer, infection w/ CBC,

## 2013-10-02 NOTE — Telephone Encounter (Signed)
Pt states that her SOB has gotten worse. Reports chest tightness and chest pain as well. Has been taking Prednisone, Albuterol neb and Flovent with no relief. Has an appointment on 10/06/13 with CY. Wants to know if she can be seen today if at all possible.  CY - please advise. Thanks.

## 2013-10-02 NOTE — Assessment & Plan Note (Signed)
Plan- continue biaxin, check D-dimer, ACE, CBC

## 2013-10-05 ENCOUNTER — Telehealth: Payer: Self-pay | Admitting: Internal Medicine

## 2013-10-05 DIAGNOSIS — D869 Sarcoidosis, unspecified: Secondary | ICD-10-CM

## 2013-10-05 DIAGNOSIS — R079 Chest pain, unspecified: Secondary | ICD-10-CM

## 2013-10-05 NOTE — Telephone Encounter (Signed)
Order CT chest with contrast   Dx Sarcoid, chest pain      BMET

## 2013-10-05 NOTE — Telephone Encounter (Signed)
Reports SOB, congestion and chest pain x3 weeks. Denies cough or mucus production. Has been using Flovent and albuterol with no relief. Still taking Baxin and Prednisone. No fever/chills but does have body aches.  Would like CY recs.  Allergies  Allergen Reactions  . Ciprofloxacin Swelling  . Latex Rash  . Penicillins Rash   Current Outpatient Prescriptions on File Prior to Visit  Medication Sig Dispense Refill  . albuterol (PROVENTIL HFA;VENTOLIN HFA) 108 (90 BASE) MCG/ACT inhaler Inhale 2 puffs into the lungs every 4 (four) hours as needed. Asthma      . clarithromycin (BIAXIN) 500 MG tablet Take 1 tablet (500 mg total) by mouth 2 (two) times daily.  14 tablet  0  . fluticasone (FLOVENT HFA) 220 MCG/ACT inhaler Inhale 2 puffs into the lungs 2 (two) times daily. Rinse mouth      . Multiple Vitamin (MULTIVITAMIN WITH MINERALS) TABS Take 1 tablet by mouth daily.      . predniSONE (DELTASONE) 10 MG tablet 4 X 2 DAYS, 3 X 2 DAYS, 2 X 2 DAYS, 1 daily x 2 days, then one every other day  50 tablet  0   No current facility-administered medications on file prior to visit.    CY - please advise. Thanks.

## 2013-10-05 NOTE — Telephone Encounter (Signed)
Pt aware of recs and orders placed.

## 2013-10-06 ENCOUNTER — Other Ambulatory Visit (INDEPENDENT_AMBULATORY_CARE_PROVIDER_SITE_OTHER): Payer: Medicare Other

## 2013-10-06 ENCOUNTER — Encounter: Payer: Self-pay | Admitting: Internal Medicine

## 2013-10-06 ENCOUNTER — Encounter: Payer: Self-pay | Admitting: *Deleted

## 2013-10-06 ENCOUNTER — Inpatient Hospital Stay: Admission: RE | Admit: 2013-10-06 | Payer: Medicare Other | Source: Ambulatory Visit

## 2013-10-06 ENCOUNTER — Ambulatory Visit (INDEPENDENT_AMBULATORY_CARE_PROVIDER_SITE_OTHER): Payer: Medicare Other | Admitting: Internal Medicine

## 2013-10-06 VITALS — BP 108/72 | HR 73 | Ht 64.0 in | Wt 153.4 lb

## 2013-10-06 DIAGNOSIS — F17200 Nicotine dependence, unspecified, uncomplicated: Secondary | ICD-10-CM

## 2013-10-06 DIAGNOSIS — J189 Pneumonia, unspecified organism: Secondary | ICD-10-CM

## 2013-10-06 DIAGNOSIS — D869 Sarcoidosis, unspecified: Secondary | ICD-10-CM

## 2013-10-06 DIAGNOSIS — R079 Chest pain, unspecified: Secondary | ICD-10-CM

## 2013-10-06 LAB — BASIC METABOLIC PANEL
BUN: 13 mg/dL (ref 6–23)
Calcium: 10 mg/dL (ref 8.4–10.5)
Creatinine, Ser: 0.9 mg/dL (ref 0.4–1.2)
GFR: 92.43 mL/min (ref >60.00)
Glucose, Bld: 94 mg/dL (ref 70–99)
Sodium: 137 mEq/L (ref 135–145)

## 2013-10-06 NOTE — Patient Instructions (Signed)
Order- lab ACE level    Dx sarcoid  Go ahead with the CT scan scheduled this afternoon

## 2013-10-06 NOTE — Progress Notes (Signed)
04/11/12- 12 yoF former smoker followed for sarcoid, allergic rhinitis, allergic conjunctivitis, complicated by GERD LOV-01/26/2011 Smokes on and off "stress" after her aunt died. Or shortness of breath in the last month attributed to pollen bothering her asthma. Mild wheeze. Used her nebulizer. Incidentally took prednisone for gout in her foot, which also helped her asthma. Chest x-ray 02/05/2011-stable Labs from 01/26/2011 reviewed. Unremarkable. Good liver and renal function  06/15/13-  45 yoF  smoker followed for sarcoid, allergic rhinitis, allergic conjunctivitis, complicated by GERD FOLLOWS FOR: pt reports she is more short of breath, vision is becoming more blurring, increasing joint pain, and scratchy throat (has been smoking "some") Admits smoking 2 packs per week. Had labs at her PCP in [place], where she lives, raising question of sarcoid activity. She does not have those labs and asks that we repeat. No real change in occasional night sweats since menopause. No adenopathy or rash. Some shortness of breath, scratchy throat, aching of knees and hips.  09/28/13- 47 yoF  smoker followed for sarcoid, allergic rhinitis, allergic conjunctivitis, complicated by GERD ACUTE VISIT: just not feeling well; started abx on 09-23-13 for pna per CXR. Continues to have SOB and fatigued. On 09/14/13 she called reporting sharp back pain, malaise.  CXR 11/3> RLL pneumonia. We called in Biaxin, refilled neb albuterol Rx. C/O L hip joing pain and aches both calves. Denies cough, fever, phlegm. Attributes sweats to her hot flashes.  Pain w/ deep breath across anterior chest under breasts x 2 weeks "sharp, sticking". ACE 05/2013- 56H CXR 09/14/13 IMPRESSION:  New medial right basilar infiltrate consistent with pneumonia.  Underlying emphysematous changes and right upper lobe scarring.  Electronically Signed  By: Ulyses Southward M.D.  On: 09/21/2013 15:44  10/06/13- 81 yoF  Former smoker followed for sarcoid,  allergic rhinitis, allergic conjunctivitis, complicated by GERD Pt states she found a knot in her neck(has been there for a few months now). Pt is having SOB as well Found posterior left cervical node. Had asthma attack at work 4 days ago. She called [place] reporting shortness of breath and sharp right lateral chest wall pain with deep breath. No leg cramps or swelling and no history of DVT. She is finishing prednisone and completed antibiotic. Quit smoking last month. Peripheral parenchymal density right lower lobe-pending chest CT in this smoker with history of sarcoid.  ROS-see HPI Constitutional:   No-   weight loss, +night sweats, fevers, chills, fatigue, lassitude. HEENT:   No-  headaches, difficulty swallowing, tooth/dental problems, sore throat,       No-  sneezing, itching, ear ache, nasal congestion, post nasal drip,  CV:  +chest pain, No-orthopnea, PND, swelling in lower extremities, anasarca,  dizziness, palpitations Resp: +  shortness of breath with exertion or at rest.              No-   productive cough,  No- non-productive cough,  No- coughing up of blood.              No-   change in color of mucus.  No- wheezing.   Skin: No-   rash or lesions. GI:  No-   heartburn, indigestion, abdominal pain, nausea, vomiting,  GU:  MS:  + joint pain or swelling. Neuro-     nothing unusual Psych:  No- change in mood or affect. No depression or anxiety.  No memory loss.  OBJ- Physical Exam General- Alert, Oriented, Affect+mild anxiety, Distress- none acute. +clammy/ diaphoretic "hot flashy" Skin- no rash or lesion.  Lymphadenopathy- +very small posterior cervical nodes, non-tender Head- atraumatic            Eyes- Gross vision intact, PERRLA, conjunctivae and secretions clear            Ears- Hearing, canals-normal            Nose- Clear, no-Septal dev, mucus, polyps, erosion, perforation             Throat- Mallampati III , mucosa clear , drainage- none, tonsils- atrophic Neck- flexible ,  trachea midline, no stridor , thyroid nl, carotid no bruit Chest - symmetrical excursion , unlabored           Heart/CV- RRR , no murmur , no gallop  , no rub, nl s1 s2                           - JVD- none , edema- none, stasis changes- none, varices- none           Lung- clear to P&A, wheeze- none, cough- none , dullness-none, rub- none           Chest wall-  Abd-  HSM- no Br/ Gen/ Rectal- Not done, not indicated Extrem- cyanosis- none, clubbing, none, atrophy- none, strength- nl Neuro- grossly intact to observation

## 2013-10-08 ENCOUNTER — Other Ambulatory Visit: Payer: Medicare Other

## 2013-10-23 NOTE — Assessment & Plan Note (Signed)
Plan-chest CT for persistent infiltrate right lower lung

## 2013-10-23 NOTE — Assessment & Plan Note (Signed)
She states that she quit last month. Strongly encouraged to remain off of smoking

## 2013-10-23 NOTE — Assessment & Plan Note (Signed)
Markings are looking more chronic and may not be an acute pneumonia Plan-chest CT

## 2013-11-13 ENCOUNTER — Ambulatory Visit (INDEPENDENT_AMBULATORY_CARE_PROVIDER_SITE_OTHER): Payer: Medicare Other | Admitting: Internal Medicine

## 2013-11-13 ENCOUNTER — Encounter: Payer: Self-pay | Admitting: Internal Medicine

## 2013-11-13 ENCOUNTER — Encounter: Payer: Self-pay | Admitting: *Deleted

## 2013-11-13 ENCOUNTER — Other Ambulatory Visit: Payer: Medicare Other

## 2013-11-13 VITALS — BP 112/70 | HR 62 | Ht 64.0 in | Wt 156.0 lb

## 2013-11-13 DIAGNOSIS — F17200 Nicotine dependence, unspecified, uncomplicated: Secondary | ICD-10-CM

## 2013-11-13 DIAGNOSIS — D869 Sarcoidosis, unspecified: Secondary | ICD-10-CM

## 2013-11-13 NOTE — Progress Notes (Signed)
Quick Note:  Pt aware of results and no questions/concerns at this time. ______

## 2013-11-13 NOTE — Patient Instructions (Addendum)
Order- lab- ACE level    Dx Sarcoid  Please call if needed  Work note- seen today for necessary care. Ok to return to regular activity.

## 2013-11-13 NOTE — Progress Notes (Signed)
04/11/12- 5 yoF former smoker followed for sarcoid, allergic rhinitis, allergic conjunctivitis, complicated by GERD LOV-01/26/2011 Smokes on and off "stress" after her aunt died. Or shortness of breath in the last month attributed to pollen bothering her asthma. Mild wheeze. Used her nebulizer. Incidentally took prednisone for gout in her foot, which also helped her asthma. Chest x-ray 02/05/2011-stable Labs from 01/26/2011 reviewed. Unremarkable. Good liver and renal function  06/15/13-  45 yoF  smoker followed for sarcoid, allergic rhinitis, allergic conjunctivitis, complicated by GERD FOLLOWS FOR: pt reports she is more short of breath, vision is becoming more blurring, increasing joint pain, and scratchy throat (has been smoking "some") Admits smoking 2 packs per week. Had labs at her PCP in [place], where she lives, raising question of sarcoid activity. She does not have those labs and asks that we repeat. No real change in occasional night sweats since menopause. No adenopathy or rash. Some shortness of breath, scratchy throat, aching of knees and hips.  09/28/13- 47 yoF  smoker followed for sarcoid, allergic rhinitis, allergic conjunctivitis, complicated by GERD ACUTE VISIT: just not feeling well; started abx on 09-23-13 for pna per CXR. Continues to have SOB and fatigued. On 09/14/13 she called reporting sharp back pain, malaise.  CXR 11/3> RLL pneumonia. We called in Biaxin, refilled neb albuterol Rx. C/O L hip joint pain and aches both calves. Denies cough, fever, phlegm. Attributes sweats to her hot flashes.  Pain w/ deep breath across anterior chest under breasts x 2 weeks "sharp, sticking". ACE 05/2013- 56H CXR 09/14/13 IMPRESSION:  New medial right basilar infiltrate consistent with pneumonia.  Underlying emphysematous changes and right upper lobe scarring.  Electronically Signed  By: Ulyses Southward M.D.  On: 09/21/2013 15:44  10/06/13- 80 yoF  Former smoker followed for sarcoid,  allergic rhinitis, allergic conjunctivitis, complicated by GERD Pt states she found a knot in her neck(has been there for a few months now). Pt is having SOB as well Found posterior left cervical node. Had asthma attack at work 4 days ago. She called [place] reporting shortness of breath and sharp right lateral chest wall pain with deep breath. No leg cramps or swelling and no history of DVT. She is finishing prednisone and completed antibiotic. Quit smoking last month. Peripheral parenchymal density right lower lobe-pending chest CT in this smoker with history of sarcoid.  11/13/13- 18 yoF  Former smoker followed for sarcoid, allergic rhinitis, allergic conjunctivitis, complicated by GERD, cervical node,  Follows for- pt states she still has SOB not always with exertion.  Pt has no other complaints at this time. Did not get CT chest- did CXR instead Thighs hurt walking. No other myalgias, fever, cough or chest pain. Finish prednisone weeks ago. CXR 09/28/13 IMPRESSION:  There has been slight interval worsening in the appearance of the  right lower lobe infiltrate posteriorly. Otherwise the examination  is unchanged.  Electronically Signed  By: David [place]  On: 09/28/2013 16:52  ROS-see HPI Constitutional:   No-   weight loss, +night sweats, fevers, chills, fatigue, lassitude. HEENT:   No-  headaches, difficulty swallowing, tooth/dental problems, sore throat,       No-  sneezing, itching, ear ache, nasal congestion, post nasal drip,  CV:  +chest pain, No-orthopnea, PND, swelling in lower extremities, anasarca,  dizziness, palpitations Resp: +  shortness of breath with exertion or at rest.              No-   productive cough,  No- non-productive cough,  No-  coughing up of blood.              No-   change in color of mucus.  No- wheezing.   Skin: No-   rash or lesions. GI:  No-   heartburn, indigestion, abdominal pain, nausea, vomiting,  GU:  MS:  + joint pain or swelling. Neuro-     nothing  unusual Psych:  No- change in mood or affect. No depression or anxiety.  No memory loss.  OBJ- Physical Exam General- Alert, Oriented, Affect+mild anxiety, Distress- none acute. +clammy/ diaphoretic "hot flashy" Skin- no rash or lesion. Lymphadenopathy- none Head- atraumatic            Eyes- Gross vision intact, PERRLA, conjunctivae and secretions clear            Ears- Hearing, canals-normal            Nose- Clear, no-Septal dev, mucus, polyps, erosion, perforation             Throat- Mallampati III , mucosa clear , drainage- none, tonsils- atrophic Neck- flexible , trachea midline, no stridor , thyroid nl, carotid no bruit Chest - symmetrical excursion , unlabored           Heart/CV- RRR , no murmur , no gallop  , no rub, nl s1 s2                           - JVD- none , edema- none, stasis changes- none, varices- none           Lung- clear to P&A, wheeze- none, cough- none , dullness-none, rub- none           Chest wall-  Abd-  HSM- no Br/ Gen/ Rectal- Not done, not indicated Extrem- cyanosis- none, clubbing, none, atrophy- none, strength- nl Neuro- grossly intact to observation

## 2013-12-06 NOTE — Assessment & Plan Note (Signed)
Pulmonary scarring without definite active disease. thigh pains may reflect steroid withdrawal Plan-ACE level now, off prednisone. She will stay off of prednisone.

## 2013-12-06 NOTE — Assessment & Plan Note (Signed)
Remains off of cigarettes. 

## 2014-01-13 ENCOUNTER — Telehealth: Payer: Self-pay | Admitting: Internal Medicine

## 2014-01-13 MED ORDER — ALBUTEROL SULFATE HFA 108 (90 BASE) MCG/ACT IN AERS: 2.0000 | INHALATION_SPRAY | RESPIRATORY_TRACT | Status: DC | PRN

## 2014-01-13 MED ORDER — FLUTICASONE PROPIONATE HFA 220 MCG/ACT IN AERO: 2.0000 | INHALATION_SPRAY | Freq: Two times a day (BID) | RESPIRATORY_TRACT | Status: DC

## 2014-01-13 NOTE — Telephone Encounter (Signed)
Left message on voicemail that refills have been sent as requested.

## 2014-03-15 ENCOUNTER — Encounter: Payer: Self-pay | Admitting: Internal Medicine

## 2014-03-15 ENCOUNTER — Ambulatory Visit (INDEPENDENT_AMBULATORY_CARE_PROVIDER_SITE_OTHER): Payer: Medicare Other | Admitting: Internal Medicine

## 2014-03-15 VITALS — BP 130/84 | HR 86 | Ht 64.0 in | Wt 149.0 lb

## 2014-03-15 DIAGNOSIS — H1045 Other chronic allergic conjunctivitis: Secondary | ICD-10-CM

## 2014-03-15 DIAGNOSIS — D869 Sarcoidosis, unspecified: Secondary | ICD-10-CM

## 2014-03-15 MED ORDER — AZELASTINE-FLUTICASONE 137-50 MCG/ACT NA SUSP: 1.0000 | Freq: Every day | NASAL | Status: DC

## 2014-03-15 MED ORDER — FLUTICASONE FUROATE-VILANTEROL 100-25 MCG/INH IN AEPB: 1.0000 | INHALATION_SPRAY | Freq: Every day | RESPIRATORY_TRACT | Status: DC

## 2014-03-15 NOTE — Progress Notes (Signed)
04/11/12- [age] yoF former smoker followed for sarcoid, allergic rhinitis, allergic conjunctivitis, complicated by GERD LOV-01/26/2011 Smokes on and off "stress" after her aunt died. Or shortness of breath in the last month attributed to pollen bothering her asthma. Mild wheeze. Used her nebulizer. Incidentally took prednisone for gout in her foot, which also helped her asthma. Chest x-ray 02/05/2011-stable Labs from 01/26/2011 reviewed. Unremarkable. Good liver and renal function  06/15/13-  45 yoF  smoker followed for sarcoid, allergic rhinitis, allergic conjunctivitis, complicated by GERD FOLLOWS FOR: pt reports she is more short of breath, vision is becoming more blurring, increasing joint pain, and scratchy throat (has been smoking "some") Admits smoking 2 packs per week. Had labs at her PCP in [place], where she lives, raising question of sarcoid activity. She does not have those labs and asks that we repeat. No real change in occasional night sweats since menopause. No adenopathy or rash. Some shortness of breath, scratchy throat, aching of knees and hips.  09/28/13- 47 yoF  smoker followed for sarcoid, allergic rhinitis, allergic conjunctivitis, complicated by GERD ACUTE VISIT: just not feeling well; started abx on 09-23-13 for pna per CXR. Continues to have SOB and fatigued. On 09/14/13 she called reporting sharp back pain, malaise.  CXR 11/3> RLL pneumonia. We called in Biaxin, refilled neb albuterol Rx. C/O L hip joint pain and aches both calves. Denies cough, fever, phlegm. Attributes sweats to her hot flashes.  Pain w/ deep breath across anterior chest under breasts x 2 weeks "sharp, sticking". ACE 05/2013- 56H CXR 09/14/13 IMPRESSION:  New medial right basilar infiltrate consistent with pneumonia.  Underlying emphysematous changes and right upper lobe scarring.  Electronically Signed  By: Ulyses SouthwardMark Boles M.D.  On: 09/21/2013 15:44  10/06/13- [age] yoF  Former smoker followed for sarcoid,  allergic rhinitis, allergic conjunctivitis, complicated by GERD Pt states she found a knot in her neck(has been there for a few months now). Pt is having SOB as well Found posterior left cervical node. Had asthma attack at work 4 days ago. She called us reporting shortness of breath and sharp right lateral chest wall pain with deep breath. No leg cramps or swelling and no history of DVT. She is finishing prednisone and completed antibiotic. Quit smoking last month. Peripheral parenchymal density right lower lobe-pending chest CT in this smoker with history of sarcoid.  11/13/13- [age] yoF  Former smoker followed for sarcoid, allergic rhinitis, allergic conjunctivitis, complicated by GERD, cervical node,  Follows for- pt states she still has SOB not always with exertion.  Pt has no other complaints at this time. Did not get CT chest- did CXR instead Thighs hurt walking. No other myalgias, fever, cough or chest pain. Finish prednisone weeks ago. CXR 09/28/13 IMPRESSION:  There has been slight interval worsening in the appearance of the  right lower lobe infiltrate posteriorly. Otherwise the examination  is unchanged.  Electronically Signed  By: David SwazilandJordan  On: 09/28/2013 16:52  03/15/14- [age] yoF  Former smoker followed for Sarcoid, allergic rhinitis, allergic conjunctivitis, complicated by GERD, cervical node,  FOLLOWS FOR:  Increase sob, wheezing and tightness in chest since pollen came out. ACE level 11/03/13- 53 Blames spring pollen for nasal congestion, with watering eyes, chest tightness with cough. Using Flovent 220 twice daily and rescue inhaler, nebulizer twice daily.  ROS-see HPI Constitutional:   No-   weight loss, +night sweats, fevers, chills, fatigue, lassitude. HEENT:   No-  headaches, difficulty swallowing, tooth/dental problems, sore throat,  No-  sneezing, itching, ear ache, +nasal congestion, +post nasal drip,  CV:  +chest pain, No-orthopnea, PND, swelling in lower  extremities, anasarca,  dizziness, palpitations Resp: +  shortness of breath with exertion or at rest.              No-   productive cough,  + non-productive cough,  No- coughing up of blood.              No-   change in color of mucus.  No- wheezing.   Skin: No-   rash or lesions. GI:  No-   heartburn, indigestion, abdominal pain, nausea, vomiting,  GU:  MS:  + joint pain or swelling. Neuro-     nothing unusual Psych:  No- change in mood or affect. No depression or anxiety.  No memory loss.  OBJ- Physical Exam General- Alert, Oriented, Affect+mild anxiety, Distress- none acute. +clammy/ diaphoretic "hot flashy" Skin- no rash or lesion. Lymphadenopathy- none Head- atraumatic            Eyes- Gross vision intact, PERRLA, conjunctivae and secretions clear            Ears- Hearing, canals-normal            Nose- +turbinate edema, no-Septal dev, mucus, polyps, erosion, perforation             Throat- Mallampati III , mucosa clear , drainage- none, tonsils- atrophic Neck- flexible , trachea midline, no stridor , thyroid nl, carotid no bruit Chest - symmetrical excursion , unlabored           Heart/CV- RRR , no murmur , no gallop  , no rub, nl s1 s2                           - JVD- none , edema- none, stasis changes- none, varices- none           Lung- clear to P&A, wheeze- none, cough+ light , dullness-none, rub- none           Chest wall-  Abd-  HSM- no Br/ Gen/ Rectal- Not done, not indicated Extrem- cyanosis- none, clubbing, none, atrophy- none, strength- nl Neuro- grossly intact to observation

## 2014-03-15 NOTE — Patient Instructions (Signed)
Sample Dymista nasal spray    1-2 puffs each nostril, once daily at bedtime  Sample  Breo Ellipta    1 puff, then rise mouth, once daily  Ok to add an otc antihistamine like Claritin/ loratadine, or Allegra/ fexofenadine  Please call as needed

## 2014-04-11 ENCOUNTER — Emergency Department: Payer: Self-pay | Admitting: Emergency Medicine

## 2014-04-11 NOTE — Assessment & Plan Note (Signed)
Doubt significant activity.

## 2014-04-11 NOTE — Assessment & Plan Note (Signed)
Seasonal allergic rhinitis and conjunctivitis Plan-trial Allegra, try sample Dymista nasal spray

## 2014-04-13 ENCOUNTER — Telehealth: Payer: Self-pay | Admitting: Internal Medicine

## 2014-04-13 MED ORDER — PREDNISONE 20 MG PO TABS: 20.0000 mg | ORAL_TABLET | Freq: Every day | ORAL | Status: DC

## 2014-04-13 NOTE — Telephone Encounter (Signed)
Pred 20mg  #3 1po qd x 3 days sent to [facility]  Pt aware.  Nothing further needed.

## 2014-04-13 NOTE — Telephone Encounter (Signed)
Spoke with pt. Thinks she is having an asthma flare up. Reports SOB, nasal congestion, sore throat and chest tightness. Onset was a few days ago. Has been using Dymista, Breo, Albuterol and Flovent with minimal relief. Would like CY's recs.  Allergies  Allergen Reactions  . Ciprofloxacin Swelling  . Latex Rash  . Penicillins Rash   Current Outpatient Prescriptions on File Prior to Visit  Medication Sig Dispense Refill  . albuterol (PROVENTIL HFA;VENTOLIN HFA) 108 (90 BASE) MCG/ACT inhaler Inhale 2 puffs into the lungs every 4 (four) hours as needed. Asthma  1 Inhaler  2  . Azelastine-Fluticasone (DYMISTA) 137-50 MCG/ACT SUSP Place 1 spray into the nose daily.  1 Bottle  0  . fluticasone (FLOVENT HFA) 220 MCG/ACT inhaler Inhale 2 puffs into the lungs 2 (two) times daily. Rinse mouth  1 Inhaler  2  . Fluticasone Furoate-Vilanterol (BREO ELLIPTA) 100-25 MCG/INH AEPB Inhale 1 puff into the lungs daily.  1 each  0  . Multiple Vitamin (MULTIVITAMIN WITH MINERALS) TABS Take 1 tablet by mouth daily.       No current facility-administered medications on file prior to visit.    CY - please advise. Thanks.

## 2014-04-13 NOTE — Telephone Encounter (Signed)
Offer prednisone 20 mg, # 3, 1 daily x 3 days then stop

## 2014-04-14 LAB — BETA STREP CULTURE(ARMC)

## 2014-06-23 ENCOUNTER — Telehealth: Payer: Self-pay | Admitting: Internal Medicine

## 2014-06-23 NOTE — Telephone Encounter (Signed)
Spoke with pt-- feels that she may have PNA C/o increased SOB, cough with clear mucus, wheezing Denies fever Cannot sleep d/t pressure in chest when laying flat--has to sit up to sleep.  Pressure in chest has been consistent x 3 days.  Denies treating symptoms with OTC meds.   Allergies  Allergen Reactions  . Ciprofloxacin Swelling  . Latex Rash  . Penicillins Rash   Aware that Dr Maple HudsonYoung not in office. OV made for 06/24/14 at 9:30 with Dr Vassie LollAlva for Acute Visit  Please advise Dr Shelle Ironlance is any recs to give relief over the night per patient request.  thanks.

## 2014-06-23 NOTE — Telephone Encounter (Signed)
Spoke with the pt and notified of recs per KC  Pt verbalized understanding  Nothing further needed 

## 2014-06-23 NOTE — Telephone Encounter (Signed)
LMTCB x 1 

## 2014-06-23 NOTE — Telephone Encounter (Signed)
It sounds like she is really sick, and would not want to prescribe anything until she is seen.  If she worsens tonight/overnight before her visit tomorrow, will need to go to ER>

## 2014-06-24 ENCOUNTER — Telehealth: Payer: Self-pay | Admitting: Pulmonary Disease

## 2014-06-24 ENCOUNTER — Ambulatory Visit (INDEPENDENT_AMBULATORY_CARE_PROVIDER_SITE_OTHER)
Admission: RE | Admit: 2014-06-24 | Discharge: 2014-06-24 | Disposition: A | Discharge status: Home / Self Care | Payer: Medicare Other | Source: Ambulatory Visit | Attending: Pulmonary Disease | Admitting: Pulmonary Disease

## 2014-06-24 ENCOUNTER — Encounter: Payer: Self-pay | Admitting: *Deleted

## 2014-06-24 ENCOUNTER — Ambulatory Visit (INDEPENDENT_AMBULATORY_CARE_PROVIDER_SITE_OTHER): Payer: Medicare Other | Admitting: Pulmonary Disease

## 2014-06-24 ENCOUNTER — Encounter: Payer: Self-pay | Admitting: Pulmonary Disease

## 2014-06-24 VITALS — BP 124/78 | HR 90 | Ht 64.0 in | Wt 156.4 lb

## 2014-06-24 DIAGNOSIS — J189 Pneumonia, unspecified organism: Secondary | ICD-10-CM

## 2014-06-24 DIAGNOSIS — J45909 Unspecified asthma, uncomplicated: Secondary | ICD-10-CM

## 2014-06-24 DIAGNOSIS — D869 Sarcoidosis, unspecified: Secondary | ICD-10-CM

## 2014-06-24 MED ORDER — PREDNISONE 10 MG PO TABS: ORAL_TABLET | ORAL | Status: DC

## 2014-06-24 NOTE — Telephone Encounter (Signed)
CXr shows scarring High res CT chest - no cont-rast,  ILD protocol

## 2014-06-24 NOTE — Addendum Note (Signed)
Addended by: Tommie SamsSILVA, MINDY S on: 06/24/2014 02:22 PM   Modules accepted: Orders

## 2014-06-24 NOTE — Assessment & Plan Note (Addendum)
Treat as asthma flare Since second flare, time to step up to steroid/laba combination - did not tolerate breo STart dulera 200 2 puffs twice daily INSTEAD of FLOVENT Rinse mouth after use If no better in 2 days, start Prednisone 10 mg tabs  Take 2 tabs daily with food x 5ds, then 1 tab daily with food x 5ds then STOP Call if no better by Monday Letter for work

## 2014-06-24 NOTE — Telephone Encounter (Signed)
I spoke with patient about results and he verbalized understanding and had no questions  CT orded

## 2014-06-24 NOTE — Progress Notes (Signed)
   Subjective:    Patient ID: Stephanie Stephens, female    DOB: 09-12-1966, 48 y.o.   MRN: [id number]  HPI  [age] yoF Former smoker followed for Sarcoid, allergic rhinitis, allergic conjunctivitis, complicated by GERD   ACE level 11/03/13- 53 Last prednisone dec 2014 H/o RLL pna nov 2014 Reviewed last visit 02/2014  Chief Complaint  Patient presents with  . Acute Visit    CDY PT. 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 Asthma   . Sarcoidosis   . Diverticulosis   . Colon polyps       Review of Systems neg for any significant sore throat, dysphagia, itching, sneezing, nasal congestion or excess/ purulent secretions, fever, chills, sweats, unintended wt loss, pleuritic or exertional cp, hempoptysis, orthopnea pnd or change in chronic leg swelling. Also denies presyncope, palpitations, heartburn, abdominal pain, nausea, vomiting, diarrhea or change in bowel or urinary habits, dysuria,hematuria, rash, arthralgias, visual complaints, headache, numbness weakness or ataxia.     Objective:   Physical Exam  Gen. Pleasant, well-nourished, in no distress, normal affect ENT - no lesions, no post nasal drip Neck: No JVD, no thyromegaly, no carotid bruits Lungs: no use of accessory muscles, no dullness to percussion, decreased without rales or rhonchi  Cardiovascular: Rhythm regular, heart sounds  normal, no murmurs or gallops, no peripheral edema Abdomen: soft and non-tender, no hepatosplenomegaly, BS normal. Musculoskeletal: No deformities, no cyanosis or clubbing Neuro:  alert, non focal          Assessment & Plan:

## 2014-06-24 NOTE — Assessment & Plan Note (Signed)
ILD dates back to 2011 CT abd CXR shows ILD , RLL nodule vs nipple shadow HRCT to clarify

## 2014-06-24 NOTE — Patient Instructions (Addendum)
STart Dulera 2 puffs twice daily INSTEAD of FLOVENT Rinse mouth after use If no better in 2 days, start Prednisone 10 mg tabs  Take 2 tabs daily with food x 5ds, then 1 tab daily with food x 5ds then STOP CXr today Call if no better by Monday Letter for work

## 2014-06-24 NOTE — Assessment & Plan Note (Signed)
CXr today- if infx can treat with augmentin

## 2014-06-28 ENCOUNTER — Telehealth: Payer: Self-pay | Admitting: Internal Medicine

## 2014-06-28 NOTE — Telephone Encounter (Signed)
lmomtcb x1 

## 2014-06-28 NOTE — Telephone Encounter (Signed)
Pt called back. She scheduled an appt to see CDY tomorrow at 2 pm./ nothing further needed

## 2014-06-29 ENCOUNTER — Encounter: Payer: Self-pay | Admitting: Emergency Medicine

## 2014-06-29 ENCOUNTER — Encounter: Payer: Self-pay | Admitting: Internal Medicine

## 2014-06-29 ENCOUNTER — Other Ambulatory Visit (INDEPENDENT_AMBULATORY_CARE_PROVIDER_SITE_OTHER): Payer: Medicare Other

## 2014-06-29 ENCOUNTER — Ambulatory Visit (INDEPENDENT_AMBULATORY_CARE_PROVIDER_SITE_OTHER): Payer: Medicare Other | Admitting: Internal Medicine

## 2014-06-29 VITALS — BP 114/60 | HR 80 | Ht 64.0 in | Wt 156.4 lb

## 2014-06-29 DIAGNOSIS — J4531 Mild persistent asthma with (acute) exacerbation: Secondary | ICD-10-CM

## 2014-06-29 DIAGNOSIS — J309 Allergic rhinitis, unspecified: Secondary | ICD-10-CM

## 2014-06-29 DIAGNOSIS — H1010 Acute atopic conjunctivitis, unspecified eye: Secondary | ICD-10-CM

## 2014-06-29 DIAGNOSIS — D869 Sarcoidosis, unspecified: Secondary | ICD-10-CM

## 2014-06-29 DIAGNOSIS — H1013 Acute atopic conjunctivitis, bilateral: Secondary | ICD-10-CM

## 2014-06-29 DIAGNOSIS — F17200 Nicotine dependence, unspecified, uncomplicated: Secondary | ICD-10-CM

## 2014-06-29 DIAGNOSIS — J45901 Unspecified asthma with (acute) exacerbation: Secondary | ICD-10-CM

## 2014-06-29 LAB — BASIC METABOLIC PANEL
BUN: 9 mg/dL (ref 6–23)
CALCIUM: 10 mg/dL (ref 8.4–10.5)
CO2: 25 mEq/L (ref 19–32)
Chloride: 104 mEq/L (ref 96–112)
Creatinine, Ser: 0.9 mg/dL (ref 0.4–1.2)
GFR: 90.91 mL/min (ref >60.00)
GLUCOSE: 58 mg/dL — AB (ref 70–99)
Potassium: 3.7 mEq/L (ref 3.5–5.1)
Sodium: 139 mEq/L (ref 135–145)

## 2014-06-29 LAB — CBC WITH DIFFERENTIAL/PLATELET
Basophils Absolute: 0 10*3/uL (ref 0.0–0.1)
Basophils Relative: 0.4 % (ref 0.0–3.0)
Eosinophils Absolute: 0.2 10*3/uL (ref 0.0–0.7)
Eosinophils Relative: 3.8 % (ref 0.0–5.0)
HEMATOCRIT: 38.8 % (ref 36.0–46.0)
HEMOGLOBIN: 12.9 g/dL (ref 12.0–15.0)
LYMPHS ABS: 2.6 10*3/uL (ref 0.7–4.0)
Lymphocytes Relative: 44.6 % (ref 12.0–46.0)
MCHC: 33.2 g/dL (ref 30.0–36.0)
MCV: 93.1 fl (ref 78.0–100.0)
MONO ABS: 0.5 10*3/uL (ref 0.1–1.0)
MONOS PCT: 9.4 % (ref 3.0–12.0)
NEUTROS ABS: 2.4 10*3/uL (ref 1.4–7.7)
Neutrophils Relative %: 41.8 % — ABNORMAL LOW (ref 43.0–77.0)
Platelets: 269 10*3/uL (ref 150.0–400.0)
RBC: 4.16 Mil/uL (ref 3.87–5.11)
RDW: 13.9 % (ref 11.5–15.5)
WBC: 5.8 10*3/uL (ref 4.0–10.5)

## 2014-06-29 LAB — HEPATIC FUNCTION PANEL
ALBUMIN: 4 g/dL (ref 3.5–5.2)
ALT: 17 U/L (ref 0–35)
AST: 25 U/L (ref 0–37)
Alkaline Phosphatase: 79 U/L (ref 39–117)
BILIRUBIN TOTAL: 0.3 mg/dL (ref 0.2–1.2)
Bilirubin, Direct: 0 mg/dL (ref 0.0–0.3)
Total Protein: 7.9 g/dL (ref 6.0–8.3)

## 2014-06-29 NOTE — Progress Notes (Signed)
04/11/12- 55 yoF former smoker followed for sarcoid, allergic rhinitis, allergic conjunctivitis, complicated by GERD LOV-01/26/2011 Smokes on and off "stress" after her aunt died. Or shortness of breath in the last month attributed to pollen bothering her asthma. Mild wheeze. Used her nebulizer. Incidentally took prednisone for gout in her foot, which also helped her asthma. Chest x-ray 02/05/2011-stable Labs from 01/26/2011 reviewed. Unremarkable. Good liver and renal function  06/15/13-  45 yoF  smoker followed for sarcoid, allergic rhinitis, allergic conjunctivitis, complicated by GERD FOLLOWS FOR: pt reports she is more short of breath, vision is becoming more blurring, increasing joint pain, and scratchy throat (has been smoking "some") Admits smoking 2 packs per week. Had labs at her PCP in [place], where she lives, raising question of sarcoid activity. She does not have those labs and asks that we repeat. No real change in occasional night sweats since menopause. No adenopathy or rash. Some shortness of breath, scratchy throat, aching of knees and hips.  09/28/13- 47 yoF  smoker followed for sarcoid, allergic rhinitis, allergic conjunctivitis, complicated by GERD ACUTE VISIT: just not feeling well; started abx on 09-23-13 for pna per CXR. Continues to have SOB and fatigued. On 09/14/13 she called reporting sharp back pain, malaise.  CXR 11/3> RLL pneumonia. We called in Biaxin, refilled neb albuterol Rx. C/O L hip joint pain and aches both calves. Denies cough, fever, phlegm. Attributes sweats to her hot flashes.  Pain w/ deep breath across anterior chest under breasts x 2 weeks "sharp, sticking". ACE 05/2013- 56H CXR 09/14/13 IMPRESSION:  New medial right basilar infiltrate consistent with pneumonia.  Underlying emphysematous changes and right upper lobe scarring.  Electronically Signed  By: Ulyses Southward M.D.  On: 09/21/2013 15:44  10/06/13- 20 yoF  Former smoker followed for sarcoid,  allergic rhinitis, allergic conjunctivitis, complicated by GERD Pt states she found a knot in her neck(has been there for a few months now). Pt is having SOB as well Found posterior left cervical node. Had asthma attack at work 4 days ago. She called [place] reporting shortness of breath and sharp right lateral chest wall pain with deep breath. No leg cramps or swelling and no history of DVT. She is finishing prednisone and completed antibiotic. Quit smoking last month. Peripheral parenchymal density right lower lobe-pending chest CT in this smoker with history of sarcoid.  11/13/13- 88 yoF  Former smoker followed for sarcoid, allergic rhinitis, allergic conjunctivitis, complicated by GERD, cervical node,  Follows for- pt states she still has SOB not always with exertion.  Pt has no other complaints at this time. Did not get CT chest- did CXR instead Thighs hurt walking. No other myalgias, fever, cough or chest pain. Finish prednisone weeks ago. CXR 09/28/13 IMPRESSION:  There has been slight interval worsening in the appearance of the  right lower lobe infiltrate posteriorly. Otherwise the examination  is unchanged.  Electronically Signed  By: David Swaziland  On: 09/28/2013 16:52  03/15/14- 54 yoF  Former smoker followed for Sarcoid, allergic rhinitis, allergic conjunctivitis, complicated by GERD, cervical node,  FOLLOWS FOR:  Increase sob, wheezing and tightness in chest since pollen came out. ACE level 11/03/13- 53 Blames spring pollen for nasal congestion, with watering eyes, chest tightness with cough. Using Flovent 220 twice daily and rescue inhaler, nebulizer twice daily.  06/29/14-48 yoF  Former smoker followed for Sarcoid, allergic rhinitis, allergic conjunctivitis, complicated by GERD, cervical node,  Saw Dr Vassie Loll 8/6 for recent increased dyspnea. Didn't take recommended prednisone because she didn't  want to gain weight. Pending CT  She thinks Elwin SleightDulera may work a little better than it other  meds. Sleeping more comfortably. Complains of itching and burning eyes. Moving back to [place]. In the process of moving she's having to go up and down a lot of stairs. Smoking without effective effort to stop. Discussed support. CXR 06/24/14 IMPRESSION:  1.2 x 1.0 cm nodular opacity right lower lobe. Advise noncontrast  enhanced chest CT to further evaluate. Reticular interstitial  disease with superimposed emphysematous change. Stable scarring  right mid and upper lung zones.  These results will be called to the ordering clinician or  representative by the Radiologist Assistant, and communication  documented in the PACS or zVision Dashboard.  Electronically Signed  By: Bretta BangWilliam Woodruff M.D.  On: 06/24/2014 10:25   ROS-see HPI Constitutional:   No-   weight loss, +night sweats, fevers, chills, fatigue, lassitude. HEENT:   No-  headaches, difficulty swallowing, tooth/dental problems, sore throat,       No-  sneezing, itching, ear ache, +nasal congestion, +post nasal drip,  CV:  No-chest pain, No-orthopnea, PND, swelling in lower extremities, anasarca,  dizziness, palpitations Resp: +  shortness of breath with exertion or at rest.              No-   productive cough,  + non-productive cough,  No- coughing up of blood.              No-   change in color of mucus.  No- wheezing.   Skin: No-   rash or lesions. GI:  No-   heartburn, indigestion, abdominal pain, nausea, vomiting,  GU:  MS:  + joint pain or swelling. Neuro-     nothing unusual Psych:  No- change in mood or affect. No depression or anxiety.  No memory loss.  OBJ- Physical Exam General- Alert, Oriented, Affect+mild anxiety, Distress- none acute. +clammy/ diaphoretic "hot flashy" Skin- no rash or lesion. Lymphadenopathy- none Head- atraumatic            Eyes- Gross vision intact, PERRLA, conjunctivae and secretions clear            Ears- Hearing, canals-normal            Nose- +turbinate edema, no-Septal dev, mucus,  polyps, erosion, perforation             Throat- Mallampati III , mucosa clear , drainage- none, tonsils- atrophic Neck- flexible , trachea midline, no stridor , thyroid nl, carotid no bruit Chest - symmetrical excursion , unlabored           Heart/CV- RRR , no murmur , no gallop  , no rub, nl s1 s2                           - JVD- none , edema- none, stasis changes- none, varices- none           Lung- clear to P&A, wheeze- none, cough+-none , dullness-none, rub- none           Chest wall-  Abd-  HSM- no Br/ Gen/ Rectal- Not done, not indicated Extrem- cyanosis- none, clubbing, none, atrophy- none, strength- nl Neuro- grossly intact to observation

## 2014-06-29 NOTE — Patient Instructions (Signed)
Order- Work note LOA for last Sat and Sun, under our care  Order- lab- ACE level, D-dimer, BMET,liver panel, CBC w diff   Dx sarcoid  Ok to get the CT scan as planned  Try different  otc allergy eye drops- Naphcon, [place]

## 2014-06-30 LAB — ANGIOTENSIN CONVERTING ENZYME: Angiotensin-Converting Enzyme: 51 U/L (ref 8–52)

## 2014-06-30 LAB — D-DIMER, QUANTITATIVE (NOT AT ARMC): D DIMER QUANT: 0.27 ug{FEU}/mL (ref 0.00–0.48)

## 2014-07-01 ENCOUNTER — Ambulatory Visit (INDEPENDENT_AMBULATORY_CARE_PROVIDER_SITE_OTHER)
Admission: RE | Admit: 2014-07-01 | Discharge: 2014-07-01 | Disposition: A | Discharge status: Home / Self Care | Payer: Medicare Other | Source: Ambulatory Visit | Attending: Pulmonary Disease | Admitting: Pulmonary Disease

## 2014-07-01 DIAGNOSIS — D869 Sarcoidosis, unspecified: Secondary | ICD-10-CM

## 2014-07-01 DIAGNOSIS — J45909 Unspecified asthma, uncomplicated: Secondary | ICD-10-CM

## 2014-08-16 ENCOUNTER — Ambulatory Visit: Payer: Medicare Other | Admitting: Internal Medicine

## 2014-08-16 ENCOUNTER — Encounter: Payer: Self-pay | Admitting: *Deleted

## 2014-08-16 ENCOUNTER — Encounter: Payer: Self-pay | Admitting: Internal Medicine

## 2014-08-16 VITALS — BP 100/80 | HR 103 | Ht 64.0 in | Wt 158.6 lb

## 2014-08-16 DIAGNOSIS — Z23 Encounter for immunization: Secondary | ICD-10-CM

## 2014-08-16 NOTE — Patient Instructions (Signed)
Flu vaccine  Work note for today  As long as you continue Dulera, you don't need to also take Flovent/ fluticasone

## 2014-08-16 NOTE — Progress Notes (Signed)
04/11/12- 55 yoF former smoker followed for sarcoid, allergic rhinitis, allergic conjunctivitis, complicated by GERD LOV-01/26/2011 Smokes on and off "stress" after her aunt died. Or shortness of breath in the last month attributed to pollen bothering her asthma. Mild wheeze. Used her nebulizer. Incidentally took prednisone for gout in her foot, which also helped her asthma. Chest x-ray 02/05/2011-stable Labs from 01/26/2011 reviewed. Unremarkable. Good liver and renal function  06/15/13-  45 yoF  smoker followed for sarcoid, allergic rhinitis, allergic conjunctivitis, complicated by GERD FOLLOWS FOR: pt reports she is more short of breath, vision is becoming more blurring, increasing joint pain, and scratchy throat (has been smoking "some") Admits smoking 2 packs per week. Had labs at her PCP in [place], where she lives, raising question of sarcoid activity. She does not have those labs and asks that we repeat. No real change in occasional night sweats since menopause. No adenopathy or rash. Some shortness of breath, scratchy throat, aching of knees and hips.  09/28/13- 47 yoF  smoker followed for sarcoid, allergic rhinitis, allergic conjunctivitis, complicated by GERD ACUTE VISIT: just not feeling well; started abx on 09-23-13 for pna per CXR. Continues to have SOB and fatigued. On 09/14/13 she called reporting sharp back pain, malaise.  CXR 11/3> RLL pneumonia. We called in Biaxin, refilled neb albuterol Rx. C/O L hip joint pain and aches both calves. Denies cough, fever, phlegm. Attributes sweats to her hot flashes.  Pain w/ deep breath across anterior chest under breasts x 2 weeks "sharp, sticking". ACE 05/2013- 56H CXR 09/14/13 IMPRESSION:  New medial right basilar infiltrate consistent with pneumonia.  Underlying emphysematous changes and right upper lobe scarring.  Electronically Signed  By: Ulyses Southward M.D.  On: 09/21/2013 15:44  10/06/13- 20 yoF  Former smoker followed for sarcoid,  allergic rhinitis, allergic conjunctivitis, complicated by GERD Pt states she found a knot in her neck(has been there for a few months now). Pt is having SOB as well Found posterior left cervical node. Had asthma attack at work 4 days ago. She called [place] reporting shortness of breath and sharp right lateral chest wall pain with deep breath. No leg cramps or swelling and no history of DVT. She is finishing prednisone and completed antibiotic. Quit smoking last month. Peripheral parenchymal density right lower lobe-pending chest CT in this smoker with history of sarcoid.  11/13/13- 88 yoF  Former smoker followed for sarcoid, allergic rhinitis, allergic conjunctivitis, complicated by GERD, cervical node,  Follows for- pt states she still has SOB not always with exertion.  Pt has no other complaints at this time. Did not get CT chest- did CXR instead Thighs hurt walking. No other myalgias, fever, cough or chest pain. Finish prednisone weeks ago. CXR 09/28/13 IMPRESSION:  There has been slight interval worsening in the appearance of the  right lower lobe infiltrate posteriorly. Otherwise the examination  is unchanged.  Electronically Signed  By: David Swaziland  On: 09/28/2013 16:52  03/15/14- 54 yoF  Former smoker followed for Sarcoid, allergic rhinitis, allergic conjunctivitis, complicated by GERD, cervical node,  FOLLOWS FOR:  Increase sob, wheezing and tightness in chest since pollen came out. ACE level 11/03/13- 53 Blames spring pollen for nasal congestion, with watering eyes, chest tightness with cough. Using Flovent 220 twice daily and rescue inhaler, nebulizer twice daily.  06/29/14-48 yoF  Former smoker followed for Sarcoid, allergic rhinitis, allergic conjunctivitis, complicated by GERD, cervical node,  Saw Dr Vassie Loll 8/6 for recent increased dyspnea. Didn't take recommended prednisone because she didn't  want to gain weight. Pending CT   CXR 06/24/14 IMPRESSION:  1.2 x 1.0 cm nodular opacity  right lower lobe. Advise noncontrast  enhanced chest CT to further evaluate. Reticular interstitial  disease with superimposed emphysematous change. Stable scarring  right mid and upper lung zones.  These results will be called to the ordering clinician or  representative by the Radiologist Assistant, and communication  documented in the PACS or zVision Dashboard.  Electronically Signed  By: Bretta Bang M.D.  On: 06/24/2014 10:25  08/16/14- 63 yoF  Former smoker followed for Sarcoid, allergic rhinitis, allergic conjunctivitis, complicated by GERD, cervical node, FOLLOWS FOR: Breathing is doing okay-increased stress lately as well.  CT chest 07/02/14 IMPRESSION:  1. No findings to explain the questioned abnormality on recent chest  radiograph.  2. Pulmonary parenchymal pattern of perilymphatic nodularity and  interstitial coarsening, together with mild mediastinal adenopathy,  most consistent with sarcoid.  Electronically Signed  By: Leanna Battles M.D.  On: 07/02/2014 12:39  ROS-see HPI Constitutional:   No-   weight loss, +night sweats, fevers, chills, fatigue, lassitude. HEENT:   No-  headaches, difficulty swallowing, tooth/dental problems, sore throat,       No-  sneezing, itching, ear ache, +nasal congestion, +post nasal drip,  CV:  +chest pain, No-orthopnea, PND, swelling in lower extremities, anasarca,  dizziness, palpitations Resp: +  shortness of breath with exertion or at rest.              No-   productive cough,  + non-productive cough,  No- coughing up of blood.              No-   change in color of mucus.  No- wheezing.   Skin: No-   rash or lesions. GI:  No-   heartburn, indigestion, abdominal pain, nausea, vomiting,  GU:  MS:  + joint pain or swelling. Neuro-     nothing unusual Psych:  No- change in mood or affect. No depression or anxiety.  No memory loss.  OBJ- Physical Exam General- Alert, Oriented, Affect+mild anxiety, Distress- none acute. +clammy/  diaphoretic "hot flashy" Skin- no rash or lesion. Lymphadenopathy- none Head- atraumatic            Eyes- Gross vision intact, PERRLA, conjunctivae and secretions clear            Ears- Hearing, canals-normal            Nose- +turbinate edema, no-Septal dev, mucus, polyps, erosion, perforation             Throat- Mallampati III , mucosa clear , drainage- none, tonsils- atrophic Neck- flexible , trachea midline, no stridor , thyroid nl, carotid no bruit Chest - symmetrical excursion , unlabored           Heart/CV- RRR , no murmur , no gallop  , no rub, nl s1 s2                           - JVD- none , edema- none, stasis changes- none, varices- none           Lung- clear to P&A, wheeze- none, cough+ light , dullness-none, rub- none           Chest wall-  Abd-  HSM- no Br/ Gen/ Rectal- Not done, not indicated Extrem- cyanosis- none, clubbing, none, atrophy- none, strength- nl Neuro- grossly intact to observation

## 2014-09-08 ENCOUNTER — Emergency Department (HOSPITAL_COMMUNITY)
Admission: EM | Admit: 2014-09-08 | Discharge: 2014-09-08 | Disposition: A | Discharge status: Home / Self Care | Payer: Medicare Other | Attending: Emergency Medicine | Admitting: Emergency Medicine

## 2014-09-08 ENCOUNTER — Telehealth: Payer: Self-pay | Admitting: Internal Medicine

## 2014-09-08 ENCOUNTER — Encounter (HOSPITAL_COMMUNITY): Payer: Self-pay | Admitting: Emergency Medicine

## 2014-09-08 DIAGNOSIS — Z91040 Latex allergy status: Secondary | ICD-10-CM | POA: Insufficient documentation

## 2014-09-08 DIAGNOSIS — M62830 Muscle spasm of back: Secondary | ICD-10-CM | POA: Diagnosis not present

## 2014-09-08 DIAGNOSIS — M542 Cervicalgia: Secondary | ICD-10-CM | POA: Diagnosis not present

## 2014-09-08 DIAGNOSIS — M25511 Pain in right shoulder: Secondary | ICD-10-CM | POA: Diagnosis not present

## 2014-09-08 DIAGNOSIS — Z7951 Long term (current) use of inhaled steroids: Secondary | ICD-10-CM | POA: Insufficient documentation

## 2014-09-08 DIAGNOSIS — Z8719 Personal history of other diseases of the digestive system: Secondary | ICD-10-CM | POA: Insufficient documentation

## 2014-09-08 DIAGNOSIS — M546 Pain in thoracic spine: Secondary | ICD-10-CM | POA: Diagnosis not present

## 2014-09-08 DIAGNOSIS — J45909 Unspecified asthma, uncomplicated: Secondary | ICD-10-CM | POA: Insufficient documentation

## 2014-09-08 DIAGNOSIS — R079 Chest pain, unspecified: Secondary | ICD-10-CM | POA: Insufficient documentation

## 2014-09-08 DIAGNOSIS — Z880 Allergy status to penicillin: Secondary | ICD-10-CM | POA: Diagnosis not present

## 2014-09-08 DIAGNOSIS — Z720 Tobacco use: Secondary | ICD-10-CM | POA: Insufficient documentation

## 2014-09-08 DIAGNOSIS — Z79899 Other long term (current) drug therapy: Secondary | ICD-10-CM | POA: Diagnosis not present

## 2014-09-08 DIAGNOSIS — Z86010 Personal history of colonic polyps: Secondary | ICD-10-CM | POA: Diagnosis not present

## 2014-09-08 DIAGNOSIS — Z862 Personal history of diseases of the blood and blood-forming organs and certain disorders involving the immune mechanism: Secondary | ICD-10-CM | POA: Insufficient documentation

## 2014-09-08 MED ORDER — MELOXICAM 7.5 MG PO TABS: 7.5000 mg | ORAL_TABLET | Freq: Every day | ORAL | Status: DC

## 2014-09-08 MED ORDER — TRAMADOL HCL 50 MG PO TABS: 50.0000 mg | ORAL_TABLET | Freq: Four times a day (QID) | ORAL | Status: DC | PRN

## 2014-09-08 MED ORDER — METHOCARBAMOL 500 MG PO TABS: 500.0000 mg | ORAL_TABLET | Freq: Two times a day (BID) | ORAL | Status: DC

## 2014-09-08 NOTE — ED Provider Notes (Signed)
CSN: [id number]     Arrival date & time 09/08/14  1305 History  This chart was scribed for non-physician practitioner, Junius FinnerErin O'Malley, PA-C working with Arby BarretteMarcy Pfeiffer, MD by Greggory StallionKayla Andersen, ED scribe. This patient was seen in room WTR9/WTR9 and the patient's care was started at 1:48 PM.   Chief Complaint  Patient presents with  . Shoulder Pain  . Chest Pain   The history is provided by the patient. No language interpreter was used.   HPI Comments: Stephanie Stephens is a 48 y.o. female with history of sarcoidosis who presents to the Emergency Department complaining of right sided neck pain that radiates into her shoulder, upper back and chest that started yesterday morning when she woke up. Pain is 10/10 at worst. Aching in nature. Reports mild pain in her chest with deep breathing. Arm movement and picking things up worsen the pain. Pt has taken tylenol and ibuprofen with no relief. Denies history of neck or shoulder injury. Denies fever, nausea, emesis, numbness or tingling in arms.   Past Medical History  Diagnosis Date  . Asthma   . Sarcoidosis   . Diverticulosis   . Colon polyps    Past Surgical History  Procedure Laterality Date  . Cesarean section      x 3  . Cholecystectomy      lap  . Hernia repair      x 4  . Tonsillectomy    . Vesicovaginal fistula closure w/ tah      fibroids  . Abdominal hysterectomy     Family History  Problem Relation Age of Onset  . Ovarian cancer Mother   . Colon polyps Mother   . Breast cancer Sister   . Colon cancer Father    History  Substance Use Topics  . Smoking status: Current Every Day Smoker -- 0.50 packs/day    Types: Cigarettes  . Smokeless tobacco: Never Used  . Alcohol Use: No   OB History   Grav Para Term Preterm Abortions TAB SAB Ect Mult Living                 Review of Systems  Constitutional: Negative for fever.  Gastrointestinal: Negative for nausea and vomiting.  Musculoskeletal: Positive for arthralgias, back  pain, myalgias and neck pain.  Neurological: Negative for numbness.  All other systems reviewed and are negative.  Allergies  Ciprofloxacin; Latex; and Penicillins  Home Medications   Prior to Admission medications   Medication Sig Start Date End Date Taking? Authorizing Provider  albuterol (PROVENTIL HFA;VENTOLIN HFA) 108 (90 BASE) MCG/ACT inhaler Inhale 2 puffs into the lungs every 4 (four) hours as needed. Asthma 01/13/14 08/17/15  Waymon Budgelinton D Young, MD  albuterol (PROVENTIL) (2.5 MG/3ML) 0.083% nebulizer solution Take 2.5 mg by nebulization 3 (three) times daily.    Historical Provider, MD  meloxicam (MOBIC) 7.5 MG tablet Take 1 tablet (7.5 mg total) by mouth daily. 09/08/14   Junius FinnerErin O'Malley, PA-C  methocarbamol (ROBAXIN) 500 MG tablet Take 1 tablet (500 mg total) by mouth 2 (two) times daily. 09/08/14   Junius FinnerErin O'Malley, PA-C  mometasone-formoterol (DULERA) 200-5 MCG/ACT AERO Inhale 2 puffs into the lungs 2 (two) times daily.    Historical Provider, MD  Multiple Vitamin (MULTIVITAMIN WITH MINERALS) TABS Take 1 tablet by mouth daily.    Historical Provider, MD  traMADol (ULTRAM) 50 MG tablet Take 1 tablet (50 mg total) by mouth every 6 (six) hours as needed. 09/08/14   Junius FinnerErin O'Malley, PA-C  BP 139/75  Pulse 57  Temp(Src) 97.5 F (36.4 C) (Oral)  SpO2 100%  Physical Exam  Nursing note and vitals reviewed. Constitutional: She is oriented to person, place, and time. She appears well-developed and well-nourished.  HENT:  Head: Normocephalic and atraumatic.  Eyes: EOM are normal.  Neck: Normal range of motion. Neck supple.  No midline bone tenderness, no crepitus or step-offs.    Cardiovascular: Normal rate, regular rhythm and normal heart sounds.   Pulmonary/Chest: Effort normal and breath sounds normal. No respiratory distress. She has no wheezes. She has no rhonchi. She has no rales. She exhibits tenderness ( right upper with palpation, no crepitus or flail chest).  Musculoskeletal:  Normal range of motion. She exhibits tenderness. She exhibits no edema.  Tenderness in right upper trapezius. Worse with movement of right shoulder. No midline spinal tenderness.  Neurological: She is alert and oriented to person, place, and time.  Skin: Skin is warm and dry.  Psychiatric: She has a normal mood and affect. Her behavior is normal.    ED Course  Procedures (including critical care time)  DIAGNOSTIC STUDIES: Oxygen Saturation is 100% on RA, normal by my interpretation.    COORDINATION OF CARE: 1:50 PM-Discussed treatment plan which includes a muscle relaxer, an anti-inflammatory and pain medication with pt at bedside and pt agreed to plan.   Labs Review Labs Reviewed - No data to display  Imaging Review No results found.   EKG Interpretation   Date/Time:  Wednesday September 08 2014 13:15:37 EDT Ventricular Rate:  55 PR Interval:  200 QRS Duration: 79 QT Interval:  393 QTC Calculation: 376 R Axis:   67 Text Interpretation:  Sinus rhythm Baseline wander in lead(s) II III aVF  V6 agree. no change from old. Confirmed by Donnald GarrePfeiffer, MD, Lebron ConnersMarcy [phone]([id number]) on  09/08/2014 1:54:16 PM      MDM   Final diagnoses:  Right shoulder pain  Muscle spasm of back   Pt presenting to ED With c/o right shoulder pain radiating into right side of her chest. Worst with movement. Pain reproducible with palpation.  Doubt ACS, PE, or pneumonia. Will tx for muscle spasm. Rx: tramadol, robaxin, and mobic. Home care instructions provided. Return precautions provided. Pt verbalized understanding and agreement with tx plan.   I personally performed the services described in this documentation, which was scribed in my presence. The recorded information has been reviewed and is accurate.  Junius Finnerrin O'Malley, PA-C 09/08/14 1450

## 2014-09-08 NOTE — Telephone Encounter (Signed)
Spoke with patient--Pt c/o severe chest pain and SOB that is increasing.  Pt states that the pain is radiating down into her right arm and her arm is hurting so bad that she can barely move it. Advised pt to go to ED, if unable to drive herself she needs to call 911.  Pt states that she really wanted to come see CY and let him evaluate her and make this decision. I advised the patient that anytime there is chest pain this severe involved it is best to seek the best medical care which would be at the ED. Pt states that she will go to ED now.   Will send to Dr Maple HudsonYoung as Lorain ChildesFYI.  Nothing further needed.

## 2014-09-08 NOTE — ED Notes (Addendum)
Pt c/o R neck, R shoulder, and R chest pain x 1 day.  Pain score 10/10.  Pt reports pain increases w/ arm movement and when "picking up things."  Pt had difficulty removing backpack, due to pain.

## 2014-09-13 NOTE — ED Provider Notes (Signed)
Medical screening examination/treatment/procedure(s) were performed by non-physician practitioner and as supervising physician I was immediately available for consultation/collaboration.   EKG Interpretation   Date/Time:  Wednesday September 08 2014 13:15:37 EDT Ventricular Rate:  55 PR Interval:  200 QRS Duration: 79 QT Interval:  393 QTC Calculation: 376 R Axis:   67 Text Interpretation:  Sinus rhythm Baseline wander in lead(s) II III aVF  V6 agree. no change from old. Confirmed by Donnald GarrePfeiffer, MD, Lebron ConnersMarcy [phone] on  09/08/2014 1:54:16 PM       Arby BarretteMarcy Marylen Zuk, MD 09/13/14 1544

## 2014-11-03 ENCOUNTER — Telehealth: Payer: Self-pay | Admitting: Internal Medicine

## 2014-11-03 MED ORDER — NICOTINE 21 MG/24HR TD PT24: 21.0000 mg | MEDICATED_PATCH | Freq: Every day | TRANSDERMAL | Status: DC

## 2014-11-03 NOTE — Telephone Encounter (Signed)
Spoke with pt, she is requesting a rx for nicotine patches.  I do not see these on her current med list.  Dr. Maple HudsonYoung are you ok with sending in a rx for this? Last ov: 08/16/14 Next ov: 02/14/15  Current Outpatient Prescriptions on File Prior to Visit  Medication Sig Dispense Refill  . albuterol (PROVENTIL HFA;VENTOLIN HFA) 108 (90 BASE) MCG/ACT inhaler Inhale 2 puffs into the lungs every 4 (four) hours as needed. Asthma 1 Inhaler 2  . albuterol (PROVENTIL) (2.5 MG/3ML) 0.083% nebulizer solution Take 2.5 mg by nebulization 3 (three) times daily.    . meloxicam (MOBIC) 7.5 MG tablet Take 1 tablet (7.5 mg total) by mouth daily. 30 tablet 0  . methocarbamol (ROBAXIN) 500 MG tablet Take 1 tablet (500 mg total) by mouth 2 (two) times daily. 20 tablet 0  . mometasone-formoterol (DULERA) 200-5 MCG/ACT AERO Inhale 2 puffs into the lungs 2 (two) times daily.    . Multiple Vitamin (MULTIVITAMIN WITH MINERALS) TABS Take 1 tablet by mouth daily.    . traMADol (ULTRAM) 50 MG tablet Take 1 tablet (50 mg total) by mouth every 6 (six) hours as needed. 15 tablet 0   No current facility-administered medications on file prior to visit.   Allergies  Allergen Reactions  . Ciprofloxacin Swelling  . Latex Rash  . Penicillins Rash

## 2014-11-03 NOTE — Telephone Encounter (Signed)
Per CY: 21mg  is correct This has been sent to the patient pharmacy. Nothing further needed.

## 2014-11-03 NOTE — Telephone Encounter (Signed)
Spoke with pt, aware that we will send Rx to pharmacy for nicotine patches.  Dr Maple HudsonYoung just to clarify, the nicotine patches only come in 7mg , 14mg  and 21mg . Before sending into pharmacy, please verify that 21mg  is okay. Thanks.

## 2014-11-03 NOTE — Telephone Encounter (Signed)
I didn't know she was smoking again. Offer Rx nicotine patch 28 mg, 1 box,   1daily, then remove.

## 2014-11-21 ENCOUNTER — Encounter: Payer: Self-pay | Admitting: Internal Medicine

## 2014-11-21 NOTE — Assessment & Plan Note (Signed)
We will go forward with trial of Naphcon. Recognize the symptoms could also include components of dry eye, sarcoid, allergic and nonallergic irritants.

## 2014-11-21 NOTE — Assessment & Plan Note (Signed)
Variable symptoms. She didn't take prescribed prednisone when last here, because of fear of weight gain. Continues Dulera. Requests a work note for recent missed work. Plan-evaluate dyspnea with ACE level, d-dimer, chemistry and CBC

## 2014-11-21 NOTE — Assessment & Plan Note (Signed)
She is smoking a few cigarettes a day. We discussed previous success and available support.

## 2014-12-07 ENCOUNTER — Encounter: Payer: Self-pay | Admitting: *Deleted

## 2014-12-07 ENCOUNTER — Telehealth: Payer: Self-pay | Admitting: Internal Medicine

## 2014-12-07 ENCOUNTER — Ambulatory Visit (INDEPENDENT_AMBULATORY_CARE_PROVIDER_SITE_OTHER): Payer: Medicare Other | Admitting: Internal Medicine

## 2014-12-07 ENCOUNTER — Encounter: Payer: Self-pay | Admitting: Internal Medicine

## 2014-12-07 VITALS — BP 104/70 | HR 88 | Temp 98.4°F | Ht 64.0 in | Wt 153.0 lb

## 2014-12-07 DIAGNOSIS — F17210 Nicotine dependence, cigarettes, uncomplicated: Secondary | ICD-10-CM

## 2014-12-07 DIAGNOSIS — J4531 Mild persistent asthma with (acute) exacerbation: Secondary | ICD-10-CM

## 2014-12-07 MED ORDER — BUDESONIDE-FORMOTEROL FUMARATE 160-4.5 MCG/ACT IN AERO: INHALATION_SPRAY | RESPIRATORY_TRACT | Status: DC

## 2014-12-07 MED ORDER — AZITHROMYCIN 250 MG PO TABS: ORAL_TABLET | ORAL | Status: DC

## 2014-12-07 MED ORDER — PREDNISONE 10 MG PO TABS: ORAL_TABLET | ORAL | Status: DC

## 2014-12-07 NOTE — Progress Notes (Signed)
04/11/12- 55 yoF former smoker followed for sarcoid, allergic rhinitis, allergic conjunctivitis, complicated by GERD LOV-01/26/2011 Smokes on and off "stress" after her aunt died. Or shortness of breath in the last month attributed to pollen bothering her asthma. Mild wheeze. Used her nebulizer. Incidentally took prednisone for gout in her foot, which also helped her asthma. Chest x-ray 02/05/2011-stable Labs from 01/26/2011 reviewed. Unremarkable. Good liver and renal function  06/15/13-  45 yoF  smoker followed for sarcoid, allergic rhinitis, allergic conjunctivitis, complicated by GERD FOLLOWS FOR: pt reports she is more short of breath, vision is becoming more blurring, increasing joint pain, and scratchy throat (has been smoking "some") Admits smoking 2 packs per week. Had labs at her PCP in [place], where she lives, raising question of sarcoid activity. She does not have those labs and asks that we repeat. No real change in occasional night sweats since menopause. No adenopathy or rash. Some shortness of breath, scratchy throat, aching of knees and hips.  09/28/13- 47 yoF  smoker followed for sarcoid, allergic rhinitis, allergic conjunctivitis, complicated by GERD ACUTE VISIT: just not feeling well; started abx on 09-23-13 for pna per CXR. Continues to have SOB and fatigued. On 09/14/13 she called reporting sharp back pain, malaise.  CXR 11/3> RLL pneumonia. We called in Biaxin, refilled neb albuterol Rx. C/O L hip joint pain and aches both calves. Denies cough, fever, phlegm. Attributes sweats to her hot flashes.  Pain w/ deep breath across anterior chest under breasts x 2 weeks "sharp, sticking". ACE 05/2013- 56H CXR 09/14/13 IMPRESSION:  New medial right basilar infiltrate consistent with pneumonia.  Underlying emphysematous changes and right upper lobe scarring.  Electronically Signed  By: Ulyses Southward M.D.  On: 09/21/2013 15:44  10/06/13- 20 yoF  Former smoker followed for sarcoid,  allergic rhinitis, allergic conjunctivitis, complicated by GERD Pt states she found a knot in her neck(has been there for a few months now). Pt is having SOB as well Found posterior left cervical node. Had asthma attack at work 4 days ago. She called [place] reporting shortness of breath and sharp right lateral chest wall pain with deep breath. No leg cramps or swelling and no history of DVT. She is finishing prednisone and completed antibiotic. Quit smoking last month. Peripheral parenchymal density right lower lobe-pending chest CT in this smoker with history of sarcoid.  11/13/13- 88 yoF  Former smoker followed for sarcoid, allergic rhinitis, allergic conjunctivitis, complicated by GERD, cervical node,  Follows for- pt states she still has SOB not always with exertion.  Pt has no other complaints at this time. Did not get CT chest- did CXR instead Thighs hurt walking. No other myalgias, fever, cough or chest pain. Finish prednisone weeks ago. CXR 09/28/13 IMPRESSION:  There has been slight interval worsening in the appearance of the  right lower lobe infiltrate posteriorly. Otherwise the examination  is unchanged.  Electronically Signed  By: David Swaziland  On: 09/28/2013 16:52  03/15/14- 54 yoF  Former smoker followed for Sarcoid, allergic rhinitis, allergic conjunctivitis, complicated by GERD, cervical node,  FOLLOWS FOR:  Increase sob, wheezing and tightness in chest since pollen came out. ACE level 11/03/13- 53 Blames spring pollen for nasal congestion, with watering eyes, chest tightness with cough. Using Flovent 220 twice daily and rescue inhaler, nebulizer twice daily.  06/29/14-48 yoF  Former smoker followed for Sarcoid, allergic rhinitis, allergic conjunctivitis, complicated by GERD, cervical node,  Saw Dr Vassie Loll 8/6 for recent increased dyspnea. Didn't take recommended prednisone because she didn't  want to gain weight. Pending CT   CXR 06/24/14 IMPRESSION:  1.2 x 1.0 cm nodular opacity  right lower lobe. Advise noncontrast  enhanced chest CT to further evaluate. Reticular interstitial  disease with superimposed emphysematous change. Stable scarring  right mid and upper lung zones.  These results will be called to the ordering clinician or  representative by the Radiologist Assistant, and communication  documented in the PACS or zVision Dashboard.    On: 06/24/2014 10:25  08/16/14- [age] yoF  Former smoker followed for Sarcoid, allergic rhinitis, allergic conjunctivitis, complicated by GERD, cervical node, FOLLOWS FOR: Breathing is doing okay-increased stress lately as well. rec Flu vaccine As long as you continue Dulera, you don't need to also take Flovent/ fluticasone    12/07/2014 acute ov/Jashua Knaak re:  Cough / sob still smoking  Chief Complaint  Patient presents with  . Acute Visit    Pt c/o increased SOB for the past wk. She states that she feels a "burning sensation in chest when I breathe".  She also c/o dizziness. She has had prod cough with green, blood tinged sputum. She moved into a new home 6 days ago, and woke up this am and noticed a muchroom growing out of her carpet.   cough x 2weeks, turned green x one week  maint dulera 200 2bid with baseline need for saba both hfa/neb at least twice daily but now up to 4 x daily for sob assoc with subj wheeze/ chest tightness improves transiently   No obvious other patterns in  day to day or daytime variabilty or assoc cp or    overt sinus or hb symptoms. No unusual exp hx or h/o childhood pna/ asthma or knowledge of premature birth.   Also denies any obvious fluctuation of symptoms with weather or environmental changes or other aggravating or alleviating factors except as outlined above   Current Medications, Allergies, Complete Past Medical History, Past Surgical History, Family History, and Social History were reviewed in [facility] electronic medical record.  ROS  The following are not active complaints unless  bolded sore throat, dysphagia, dental problems, itching, sneezing,  nasal congestion or excess/ purulent secretions, ear ache,   fever, chills, sweats, unintended wt loss, pleuritic or exertional cp, hemoptysis,  orthopnea pnd or leg swelling, presyncope, palpitations, heartburn, abdominal pain, anorexia, nausea, vomiting, diarrhea  or change in bowel or urinary habits, change in stools or urine, dysuria,hematuria,  rash, arthralgias, visual complaints, headache, numbness weakness or ataxia or problems with walking or coordination,  change in mood/affect or memory.             Pex  Anxious bf nad  Wt Readings from Last 3 Encounters:  12/07/14 153 lb (69.4 kg)  08/16/14 158 lb 9.6 oz (71.94 kg)  06/29/14 156 lb 6.4 oz (70.943 kg)    Vital signs reviewed  HEENT: nl dentition, turbinates, and orophanx. Nl external ear canals without cough reflex   NECK :  without JVD/Nodes/TM/ nl carotid upstrokes bilaterally   LUNGS: no acc muscle use,  Moderate insp and exp rhonchi, minimal true wheeze   CV:  RRR  no s3 or murmur or increase in P2, no edema   ABD:  soft and nontender with nl excursion in the supine position. No bruits or organomegaly, bowel sounds nl  MS:  warm without deformities, calf tenderness, cyanosis or clubbing  SKIN: warm and dry without lesions    NEURO:  alert, approp, no deficits

## 2014-12-07 NOTE — Patient Instructions (Addendum)
Prednisone 10 mg take  4 each am x 2 days,   2 each am x 2 days,  1 each am x 2 days and stop  zpak  Plan A = automatic= symbicort 160 Take 2 puffs first thing in am and then another 2 puffs about 12 hours later instead of dulera  Work on inhaler technique:  relax and gently blow all the way out then take a nice smooth deep breath back in, triggering the inhaler at same time you start breathing in.  Hold for up to 5 seconds if you can.  Rinse and gargle with water when done     Plan B = backup = proaire Only use your albuterol as a rescue medication to be used if you can't catch your breath by resting or doing a relaxed purse lip breathing pattern.  - The less you use it, the better it will work when you need it. - Ok to use up to 2 puffs  every 4 hours if you must but call for immediate appointment if use goes up over your usual need - Don't leave home without it !!  (think of it like the spare tire for your car)   Plan C = crisis Ok to use nebulizer up to every 4 hours if need be but need to see us if need goes up   Please schedule a follow up office visit in 4 weeks, sooner if needed with Dr Maple HudsonYoung

## 2014-12-07 NOTE — Telephone Encounter (Signed)
Spoke with patient-states she had been exposed to mushrooms growing in her carpet at home and fears this is affecting her sarcoidosis. Started feeling bad Friday at work. Pt was added to MW's schedule today from our schedulers and will go from there. Nothing more needed at this time.

## 2014-12-10 NOTE — Assessment & Plan Note (Addendum)
Assoc with acute flareof refractory cough/ sob/wheeze / purulent secretions  ? mech  DDX of  difficult airways management all start with A and  include Adherence, Ace Inhibitors, Acid Reflux, Active Sinus Disease, Alpha 1 Antitripsin deficiency, Anxiety masquerading as Airways dz,  ABPA,  allergy(esp in young), Aspiration (esp in elderly), Adverse effects of DPI,  Active smokers, plus two Bs  = Bronchiectasis and Beta blocker use..and one C= CHF  Adherence is always the initial "prime suspect" and is a multilayered concern that requires a "trust but verify" approach in every patient - starting with knowing how to use medications, especially inhalers, correctly, keeping up with refills and understanding the fundamental difference between maintenance and prns vs those medications only taken for a very short course and then stopped and not refilled.  The proper method of use, as well as anticipated side effects, of a metered-dose inhaler are discussed and demonstrated to the patient. Improved effectiveness after extensive coaching during this visit to a level of approximately  75% from a baseline of < 25% so needs work and trial on symbicort 160 2bid as not doing well on dulera (way too much use of saba at baseline)   ? Active sinusitis/tracheobronchitis > zpak as allergic to cipro/ pcn  Active smoking greatest concern > see sep a/p   ? Allergy > first rule is avoidance of mold, rx Prednisone 10 mg take  4 each am x 2 days,   2 each am x 2 days,  1 each am x 2 days and stop and f/u with Dr Maple HudsonYoung    Each maintenance medication was reviewed in detail including most importantly the difference between maintenance and as needed and under what circumstances the prns are to be used.  Please see instructions for details which were reviewed in writing and the patient given a copy.

## 2014-12-10 NOTE — Assessment & Plan Note (Signed)

## 2015-01-13 DIAGNOSIS — Z0000 Encounter for general adult medical examination without abnormal findings: Secondary | ICD-10-CM | POA: Diagnosis not present

## 2015-01-13 DIAGNOSIS — Z113 Encounter for screening for infections with a predominantly sexual mode of transmission: Secondary | ICD-10-CM | POA: Diagnosis not present

## 2015-01-13 DIAGNOSIS — Z01419 Encounter for gynecological examination (general) (routine) without abnormal findings: Secondary | ICD-10-CM | POA: Diagnosis not present

## 2015-01-13 DIAGNOSIS — Z131 Encounter for screening for diabetes mellitus: Secondary | ICD-10-CM | POA: Diagnosis not present

## 2015-01-13 DIAGNOSIS — Z130 Encounter for screening for diseases of the blood and blood-forming organs and certain disorders involving the immune mechanism: Secondary | ICD-10-CM | POA: Diagnosis not present

## 2015-01-13 DIAGNOSIS — Z7289 Other problems related to lifestyle: Secondary | ICD-10-CM | POA: Diagnosis not present

## 2015-01-13 DIAGNOSIS — Z1329 Encounter for screening for other suspected endocrine disorder: Secondary | ICD-10-CM | POA: Diagnosis not present

## 2015-01-13 DIAGNOSIS — Z124 Encounter for screening for malignant neoplasm of cervix: Secondary | ICD-10-CM | POA: Diagnosis not present

## 2015-01-13 DIAGNOSIS — Z13220 Encounter for screening for lipoid disorders: Secondary | ICD-10-CM | POA: Diagnosis not present

## 2015-01-13 DIAGNOSIS — Z1239 Encounter for other screening for malignant neoplasm of breast: Secondary | ICD-10-CM | POA: Diagnosis not present

## 2015-01-13 DIAGNOSIS — Z23 Encounter for immunization: Secondary | ICD-10-CM | POA: Diagnosis not present

## 2015-01-13 DIAGNOSIS — N6452 Nipple discharge: Secondary | ICD-10-CM | POA: Diagnosis not present

## 2015-01-20 ENCOUNTER — Ambulatory Visit: Payer: Medicare Other | Admitting: Internal Medicine

## 2015-01-20 ENCOUNTER — Other Ambulatory Visit: Payer: Medicare Other

## 2015-01-20 ENCOUNTER — Telehealth: Payer: Self-pay | Admitting: Internal Medicine

## 2015-01-20 ENCOUNTER — Encounter: Payer: Self-pay | Admitting: Internal Medicine

## 2015-01-20 VITALS — BP 126/70 | HR 80 | Ht 64.0 in | Wt 151.8 lb

## 2015-01-20 DIAGNOSIS — J309 Allergic rhinitis, unspecified: Secondary | ICD-10-CM

## 2015-01-20 DIAGNOSIS — D869 Sarcoidosis, unspecified: Secondary | ICD-10-CM

## 2015-01-20 NOTE — Telephone Encounter (Signed)
Spoke with pt and given appt with Dr Maple HudsonYoung today at 4:15.

## 2015-01-20 NOTE — Patient Instructions (Addendum)
Order- lab Allergy profile, ACE level   Dx allergic rhinitis, sarcoid  Consider a HEPA aircleaner in your bedroom, a dehumidifier for your apartment to reduce dampness, and plastic sheeting to cover the ground in the crawl space under your apartment as a barrier to keep moisture out.

## 2015-01-20 NOTE — Telephone Encounter (Signed)
Ok to use one of my Investment banker, [employer] slots

## 2015-01-20 NOTE — Progress Notes (Signed)
04/11/12- 55 yoF former smoker followed for sarcoid, allergic rhinitis, allergic conjunctivitis, complicated by GERD LOV-01/26/2011 Smokes on and off "stress" after her aunt died. Or shortness of breath in the last month attributed to pollen bothering her asthma. Mild wheeze. Used her nebulizer. Incidentally took prednisone for gout in her foot, which also helped her asthma. Chest x-ray 02/05/2011-stable Labs from 01/26/2011 reviewed. Unremarkable. Good liver and renal function  06/15/13-  45 yoF  smoker followed for sarcoid, allergic rhinitis, allergic conjunctivitis, complicated by GERD FOLLOWS FOR: pt reports she is more short of breath, vision is becoming more blurring, increasing joint pain, and scratchy throat (has been smoking "some") Admits smoking 2 packs per week. Had labs at her PCP in [place], where she lives, raising question of sarcoid activity. She does not have those labs and asks that we repeat. No real change in occasional night sweats since menopause. No adenopathy or rash. Some shortness of breath, scratchy throat, aching of knees and hips.  09/28/13- 47 yoF  smoker followed for sarcoid, allergic rhinitis, allergic conjunctivitis, complicated by GERD ACUTE VISIT: just not feeling well; started abx on 09-23-13 for pna per CXR. Continues to have SOB and fatigued. On 09/14/13 she called reporting sharp back pain, malaise.  CXR 11/3> RLL pneumonia. We called in Biaxin, refilled neb albuterol Rx. C/O L hip joint pain and aches both calves. Denies cough, fever, phlegm. Attributes sweats to her hot flashes.  Pain w/ deep breath across anterior chest under breasts x 2 weeks "sharp, sticking". ACE 05/2013- 56H CXR 09/14/13 IMPRESSION:  New medial right basilar infiltrate consistent with pneumonia.  Underlying emphysematous changes and right upper lobe scarring.  Electronically Signed  By: Ulyses Southward M.D.  On: 09/21/2013 15:44  10/06/13- 20 yoF  Former smoker followed for sarcoid,  allergic rhinitis, allergic conjunctivitis, complicated by GERD Pt states she found a knot in her neck(has been there for a few months now). Pt is having SOB as well Found posterior left cervical node. Had asthma attack at work 4 days ago. She called [place] reporting shortness of breath and sharp right lateral chest wall pain with deep breath. No leg cramps or swelling and no history of DVT. She is finishing prednisone and completed antibiotic. Quit smoking last month. Peripheral parenchymal density right lower lobe-pending chest CT in this smoker with history of sarcoid.  11/13/13- 88 yoF  Former smoker followed for sarcoid, allergic rhinitis, allergic conjunctivitis, complicated by GERD, cervical node,  Follows for- pt states she still has SOB not always with exertion.  Pt has no other complaints at this time. Did not get CT chest- did CXR instead Thighs hurt walking. No other myalgias, fever, cough or chest pain. Finish prednisone weeks ago. CXR 09/28/13 IMPRESSION:  There has been slight interval worsening in the appearance of the  right lower lobe infiltrate posteriorly. Otherwise the examination  is unchanged.  Electronically Signed  By: David Swaziland  On: 09/28/2013 16:52  03/15/14- 54 yoF  Former smoker followed for Sarcoid, allergic rhinitis, allergic conjunctivitis, complicated by GERD, cervical node,  FOLLOWS FOR:  Increase sob, wheezing and tightness in chest since pollen came out. ACE level 11/03/13- 53 Blames spring pollen for nasal congestion, with watering eyes, chest tightness with cough. Using Flovent 220 twice daily and rescue inhaler, nebulizer twice daily.  06/29/14-48 yoF  Former smoker followed for Sarcoid, allergic rhinitis, allergic conjunctivitis, complicated by GERD, cervical node,  Saw Dr Vassie Loll 8/6 for recent increased dyspnea. Didn't take recommended prednisone because she didn't  want to gain weight. Pending CT   CXR 06/24/14 IMPRESSION:  1.2 x 1.0 cm nodular opacity  right lower lobe. Advise noncontrast  enhanced chest CT to further evaluate. Reticular interstitial  disease with superimposed emphysematous change. Stable scarring  right mid and upper lung zones.  These results will be called to the ordering clinician or  representative by the Radiologist Assistant, and communication  documented in the PACS or zVision Dashboard.    On: 06/24/2014 10:25  08/16/14- [age] yoF  Former smoker followed for Sarcoid, allergic rhinitis, allergic conjunctivitis, complicated by GERD, cervical node, FOLLOWS FOR: Breathing is doing okay-increased stress lately as well. rec Flu vaccine As long as you continue Dulera, you don't need to also take Flovent/ fluticasone  CT chest 07/02/14 IMPRESSION: 1. No findings to explain the questioned abnormality on recent chest radiograph. 2. Pulmonary parenchymal pattern of perilymphatic nodularity and interstitial coarsening, together with mild mediastinal adenopathy, most consistent with sarcoid. Electronically Signed  By: Leanna BattlesMelinda Blietz M.D.  On: 07/02/2014 12:39  12/07/2014 acute ov/Wert re:  Cough / sob still smoking  Chief Complaint  Patient presents with  . Acute Visit    Pt c/o increased SOB for the past wk. She states that she feels a "burning sensation in chest when I breathe".  She also c/o dizziness. She has had prod cough with green, blood tinged sputum. She moved into a new home 6 days ago, and woke up this am and noticed a muchroom growing out of her carpet.   cough x 2weeks, turned green x one week  maint dulera 200 2bid with baseline need for saba both hfa/neb at least twice daily but now up to 4 x daily for sob assoc with subj wheeze/ chest tightness improves transiently  No obvious other patterns in  day to day or daytime variabilty or assoc cp or    overt sinus or hb symptoms. No unusual exp hx or h/o childhood pna/ asthma or knowledge of premature birth.  Also denies any obvious fluctuation of symptoms  with weather or environmental changes or other aggravating or alleviating factors except as outlined above    01/20/15- 48 yoF  Former smoker followed for Sarcoid, allergic rhinitis, allergic conjunctivitis, complicated by GERD, cervical node, ACUTE VISIT: Pt states she has mold in her apt and landlord will not take care of this. She has continued to have issues with her breathing and headaches. She is trying to get help with this. MW put her Symbicort has helped slightly.

## 2015-01-20 NOTE — Telephone Encounter (Signed)
Spoke with pt. Reports having SOB and chest discomfort when taking a deep breath. States that she has mold in her apartment. Cough is also present with production of yellow mucus. Wants to be seen today.  Allergies  Allergen Reactions  . Ciprofloxacin Swelling  . Latex Rash  . Penicillins Rash    CY - please advise. Thanks.

## 2015-01-21 LAB — ALLERGY FULL PROFILE
Allergen, D pternoyssinus,d7: <0.1 kU/L
Allergen,Goose feathers, e70: <0.1 kU/L
Alternaria Alternata: <0.1 kU/L
Aspergillus fumigatus, m3: <0.1 kU/L
Bermuda Grass: <0.1 kU/L
Box Elder IgE: <0.1 kU/L
Candida Albicans: <0.1 kU/L
Dog Dander: <0.1 kU/L
Elm IgE: <0.1 kU/L
G005 Rye, Perennial: <0.1 kU/L
House Dust Hollister: <0.1 kU/L
IGE (IMMUNOGLOBULIN E), SERUM: 22 kU/L (ref <115)
Oak: <0.1 kU/L
Plantain: <0.1 kU/L
Sycamore Tree: <0.1 kU/L

## 2015-01-21 LAB — ANGIOTENSIN CONVERTING ENZYME: Angiotensin-Converting Enzyme: 53 U/L — ABNORMAL HIGH (ref 8–52)

## 2015-01-28 ENCOUNTER — Other Ambulatory Visit: Payer: Self-pay | Admitting: Internal Medicine

## 2015-01-28 NOTE — Telephone Encounter (Signed)
Pt saw Young on 01/20/15, she is to f/u in 6 mos but has pending 02/14/15 appt. Albuterol (neb) refilled.

## 2015-02-03 DIAGNOSIS — N6452 Nipple discharge: Secondary | ICD-10-CM | POA: Diagnosis not present

## 2015-02-14 ENCOUNTER — Encounter: Payer: Self-pay | Admitting: Internal Medicine

## 2015-02-14 ENCOUNTER — Ambulatory Visit: Payer: Medicare Other | Admitting: Internal Medicine

## 2015-02-14 VITALS — BP 100/80 | HR 73 | Ht 64.0 in | Wt 152.0 lb

## 2015-02-14 DIAGNOSIS — J42 Unspecified chronic bronchitis: Secondary | ICD-10-CM

## 2015-02-14 NOTE — Progress Notes (Signed)
04/11/12- [age] yoF former smoker followed for sarcoid, allergic rhinitis, allergic conjunctivitis, complicated by GERD LOV-01/26/2011 Smokes on and off "stress" after her aunt died. Or shortness of breath in the last month attributed to pollen bothering her asthma. Mild wheeze. Used her nebulizer. Incidentally took prednisone for gout in her foot, which also helped her asthma. Chest x-ray 02/05/2011-stable Labs from 01/26/2011 reviewed. Unremarkable. Good liver and renal function  06/15/13-  45 yoF  smoker followed for sarcoid, allergic rhinitis, allergic conjunctivitis, complicated by GERD FOLLOWS FOR: pt reports she is more short of breath, vision is becoming more blurring, increasing joint pain, and scratchy throat (has been smoking "some") Admits smoking 2 packs per week. Had labs at her PCP in [place], where she lives, raising question of sarcoid activity. She does not have those labs and asks that we repeat. No real change in occasional night sweats since menopause. No adenopathy or rash. Some shortness of breath, scratchy throat, aching of knees and hips.  09/28/13- 47 yoF  smoker followed for sarcoid, allergic rhinitis, allergic conjunctivitis, complicated by GERD ACUTE VISIT: just not feeling well; started abx on 09-23-13 for pna per CXR. Continues to have SOB and fatigued. On 09/14/13 she called reporting sharp back pain, malaise.  CXR 11/3> RLL pneumonia. We called in Biaxin, refilled neb albuterol Rx. C/O L hip joint pain and aches both calves. Denies cough, fever, phlegm. Attributes sweats to her hot flashes.  Pain w/ deep breath across anterior chest under breasts x 2 weeks "sharp, sticking". ACE 05/2013- 56H CXR 09/14/13 IMPRESSION:  New medial right basilar infiltrate consistent with pneumonia.  Underlying emphysematous changes and right upper lobe scarring.  Electronically Signed  By: Ulyses SouthwardMark Boles M.D.  On: 09/21/2013 15:44  10/06/13- [age] yoF  Former smoker followed for sarcoid,  allergic rhinitis, allergic conjunctivitis, complicated by GERD Pt states she found a knot in her neck(has been there for a few months now). Pt is having SOB as well Found posterior left cervical node. Had asthma attack at work 4 days ago. She called us reporting shortness of breath and sharp right lateral chest wall pain with deep breath. No leg cramps or swelling and no history of DVT. She is finishing prednisone and completed antibiotic. Quit smoking last month. Peripheral parenchymal density right lower lobe-pending chest CT in this smoker with history of sarcoid.  11/13/13- [age] yoF  Former smoker followed for sarcoid, allergic rhinitis, allergic conjunctivitis, complicated by GERD, cervical node,  Follows for- pt states she still has SOB not always with exertion.  Pt has no other complaints at this time. Did not get CT chest- did CXR instead Thighs hurt walking. No other myalgias, fever, cough or chest pain. Finish prednisone weeks ago. CXR 09/28/13 IMPRESSION:  There has been slight interval worsening in the appearance of the  right lower lobe infiltrate posteriorly. Otherwise the examination  is unchanged.  Electronically Signed  By: David SwazilandJordan  On: 09/28/2013 16:52  03/15/14- [age] yoF  Former smoker followed for Sarcoid, allergic rhinitis, allergic conjunctivitis, complicated by GERD, cervical node,  FOLLOWS FOR:  Increase sob, wheezing and tightness in chest since pollen came out. ACE level 11/03/13- 53 Blames spring pollen for nasal congestion, with watering eyes, chest tightness with cough. Using Flovent 220 twice daily and rescue inhaler, nebulizer twice daily.  06/29/14-48 yoF  Former smoker followed for Sarcoid, allergic rhinitis, allergic conjunctivitis, complicated by GERD, cervical node,  Saw Dr Vassie LollAlva 8/6 for recent increased dyspnea. Didn't take recommended prednisone because she didn't  want to gain weight. Pending CT   CXR 06/24/14 IMPRESSION:  1.2 x 1.0 cm nodular opacity  right lower lobe. Advise noncontrast  enhanced chest CT to further evaluate. Reticular interstitial  disease with superimposed emphysematous change. Stable scarring  right mid and upper lung zones.  These results will be called to the ordering clinician or  representative by the Radiologist Assistant, and communication  documented in the PACS or zVision Dashboard.    On: 06/24/2014 10:25  08/16/14- 23 yoF  Former smoker followed for Sarcoid, allergic rhinitis, allergic conjunctivitis, complicated by GERD, cervical node, FOLLOWS FOR: Breathing is doing okay-increased stress lately as well. rec Flu vaccine As long as you continue Dulera, you don't need to also take Flovent/ fluticasone  CT chest 07/02/14 IMPRESSION: 1. No findings to explain the questioned abnormality on recent chest radiograph. 2. Pulmonary parenchymal pattern of perilymphatic nodularity and interstitial coarsening, together with mild mediastinal adenopathy, most consistent with sarcoid. Electronically Signed  By: Leanna Battles M.D.  On: 07/02/2014 12:39  12/07/2014 acute ov/Wert re:  Cough / sob still smoking  Chief Complaint  Patient presents with  . Acute Visit    Pt c/o increased SOB for the past wk. She states that she feels a "burning sensation in chest when I breathe".  She also c/o dizziness. She has had prod cough with green, blood tinged sputum. She moved into a new home 6 days ago, and woke up this am and noticed a muchroom growing out of her carpet.   cough x 2weeks, turned green x one week  maint dulera 200 2bid with baseline need for saba both hfa/neb at least twice daily but now up to 4 x daily for sob assoc with subj wheeze/ chest tightness improves transiently  No obvious other patterns in  day to day or daytime variabilty or assoc cp or    overt sinus or hb symptoms. No unusual exp hx or h/o childhood pna/ asthma or knowledge of premature birth.  Also denies any obvious fluctuation of symptoms  with weather or environmental changes or other aggravating or alleviating factors except as outlined above    01/20/15- 48 yoF  Former smoker followed for Sarcoid, allergic rhinitis, allergic conjunctivitis, complicated by GERD, cervical node, ACUTE VISIT: Pt states she has mold in her apt and landlord will not take care of this. She has continued to have issues with her breathing and headaches. She is trying to get help with this. MW put her Symbicort has helped slightly.  02/13/15- 20 yoF  Former smoker followed for Sarcoid, allergic rhinitis, allergic conjunctivitis, complicated by GERD, cervical node, Pt states she is still having bad headaches. She now has mushrooms growing thru her carpet. Pt has cough with clear to yellow mucus. Allergy profile 01/20/15- Neg, IgE 22 ACE level 01/20/15- stable at 53 c/w 1 year ago

## 2015-02-14 NOTE — Patient Instructions (Signed)
Recommend you call the city about health inspector to visit your home about the mold and mushrooms.  Please call as needed

## 2015-02-15 DIAGNOSIS — F329 Major depressive disorder, single episode, unspecified: Secondary | ICD-10-CM | POA: Diagnosis not present

## 2015-02-15 DIAGNOSIS — F32A Depression, unspecified: Secondary | ICD-10-CM | POA: Insufficient documentation

## 2015-02-15 DIAGNOSIS — F419 Anxiety disorder, unspecified: Secondary | ICD-10-CM | POA: Insufficient documentation

## 2015-04-25 ENCOUNTER — Other Ambulatory Visit: Payer: Self-pay

## 2015-04-25 ENCOUNTER — Telehealth: Payer: Self-pay | Admitting: Internal Medicine

## 2015-04-25 MED ORDER — BUDESONIDE-FORMOTEROL FUMARATE 160-4.5 MCG/ACT IN AERO: INHALATION_SPRAY | RESPIRATORY_TRACT | Status: DC

## 2015-04-25 NOTE — Telephone Encounter (Signed)
symbicort rx sent to pharmacy per pt request. Nothing further needed.

## 2015-06-28 DIAGNOSIS — F419 Anxiety disorder, unspecified: Secondary | ICD-10-CM | POA: Diagnosis not present

## 2015-06-28 DIAGNOSIS — L723 Sebaceous cyst: Secondary | ICD-10-CM | POA: Diagnosis not present

## 2015-07-26 ENCOUNTER — Ambulatory Visit: Payer: Medicare Other | Admitting: Internal Medicine

## 2015-09-15 ENCOUNTER — Ambulatory Visit: Payer: Medicare Other | Admitting: Internal Medicine

## 2015-10-12 ENCOUNTER — Ambulatory Visit: Payer: Medicare Other | Admitting: Internal Medicine

## 2015-10-20 IMAGING — CR DG CHEST 2V
2 series · 2 of 2 positions shown · non-contrast
Comparison: September 28, 2013 and September 21, 2013

CLINICAL DATA: Cough and chest tightness

EXAM:
CHEST  2 VIEW

[view not recorded (1 of 2)]
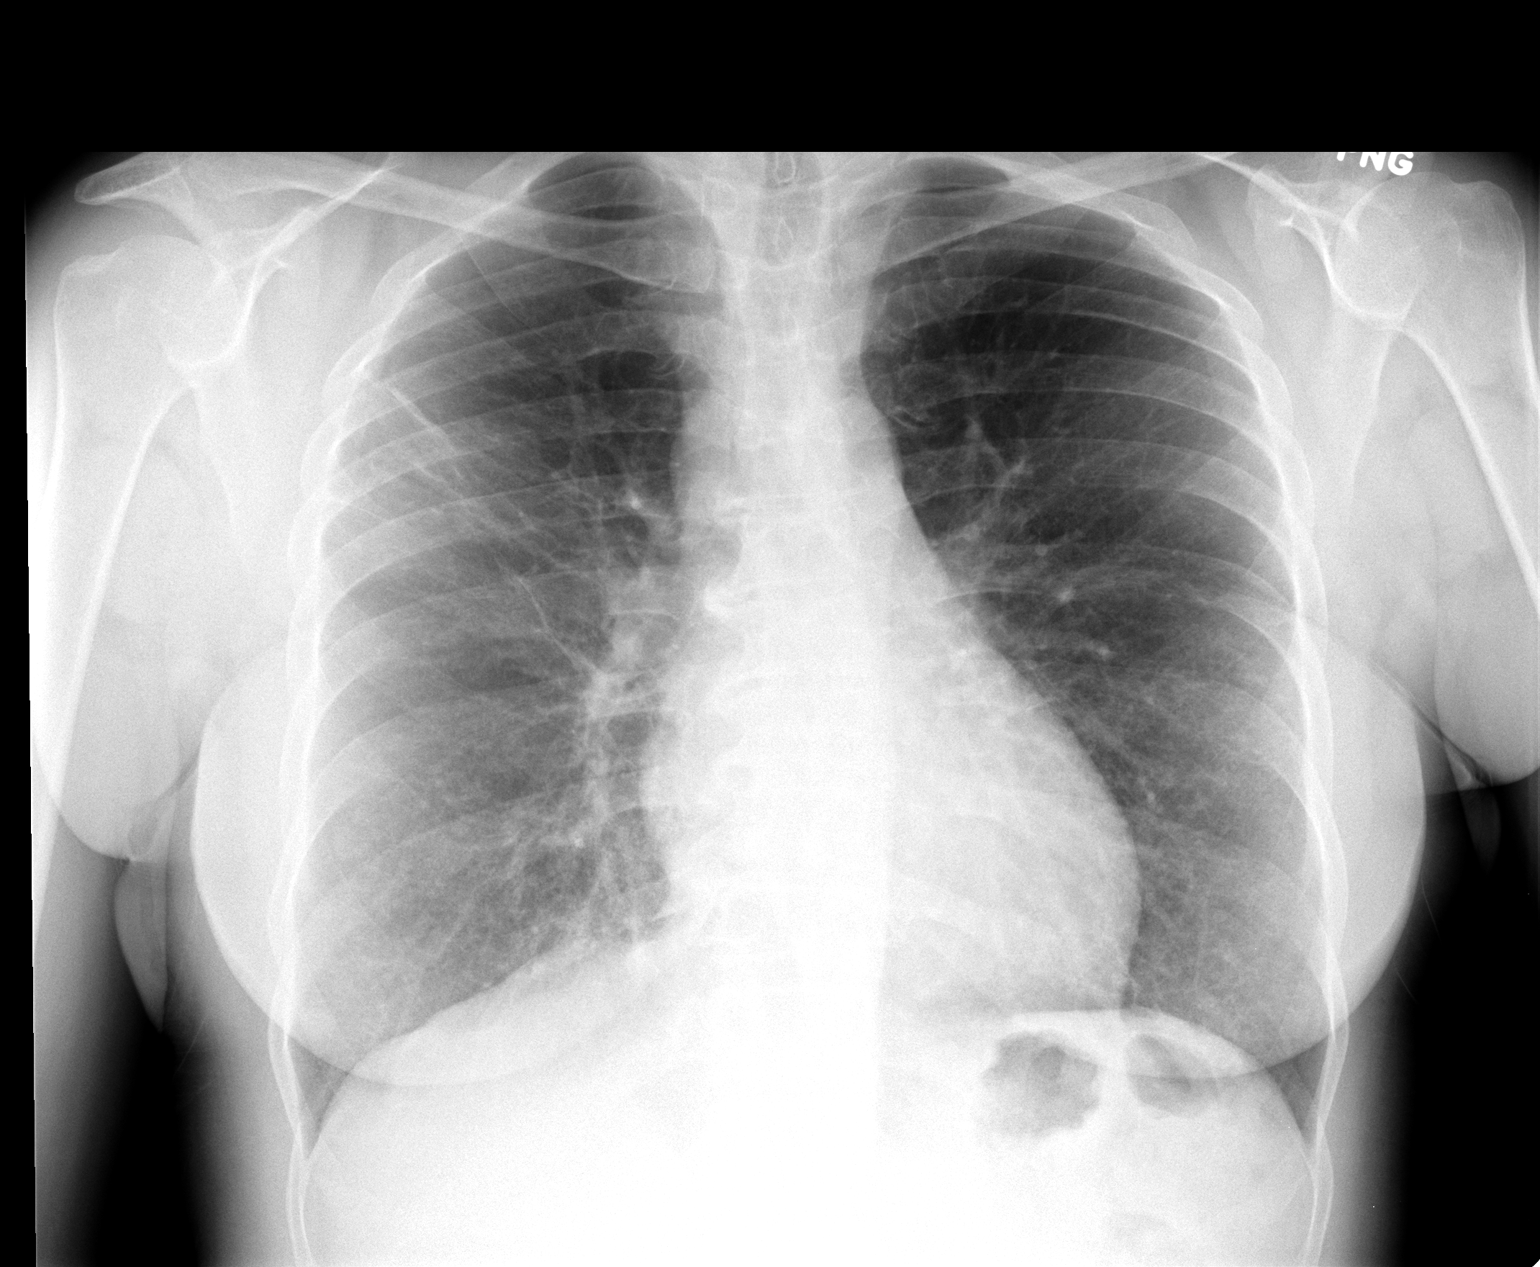

[view not recorded (2 of 2)]
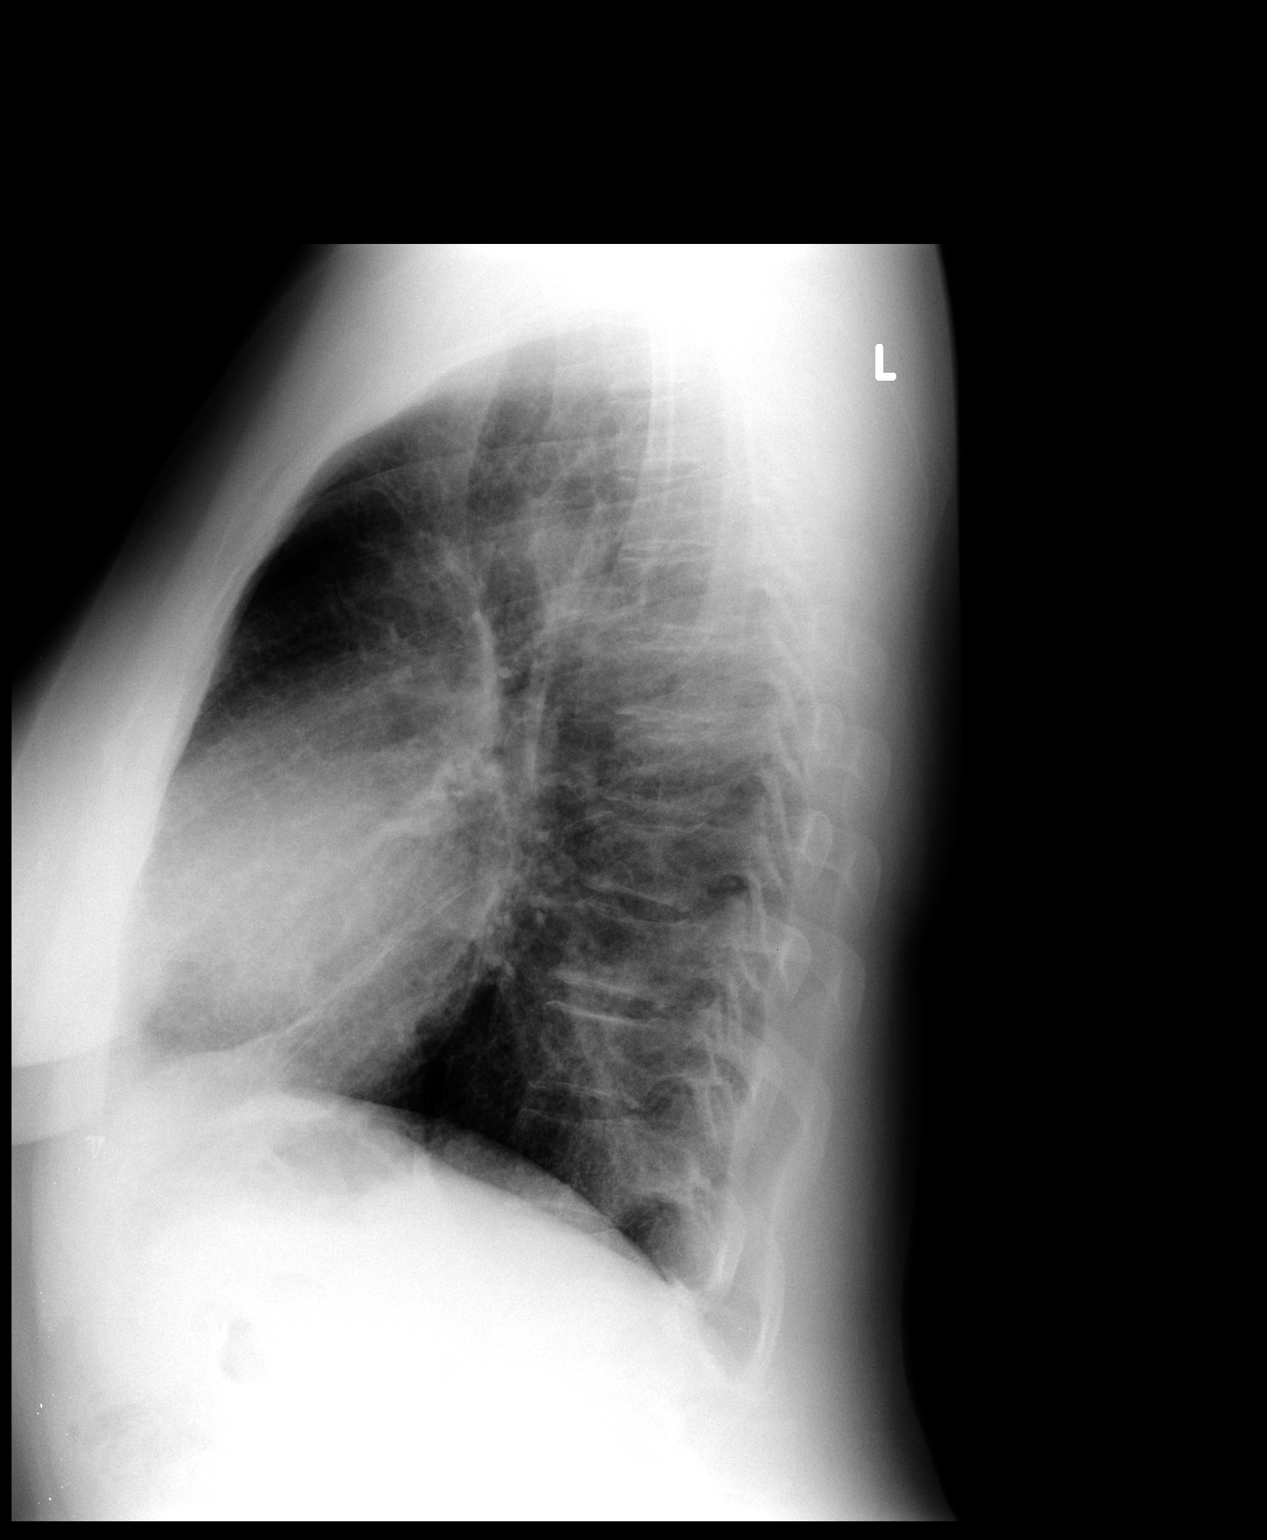

[2 of 2 positions shown; findings below may reference images not displayed]

FINDINGS: There is a degree of underlying emphysema. There is no well-defined
edema or consolidation. There is reticular interstitial disease in
the mid and lower lung zones bilaterally. There is a rather subtle
nodular opacity in the right lower lobe measuring 1.2 x 1.0 cm.
There is scarring in the right mid and upper lobes, stable. Heart
size is normal. Pulmonary vascularity reflects underlying emphysema.
No adenopathy. There is degenerative change in the thoracic spine.
IMPRESSION: 1.2 x 1.0 cm nodular opacity right lower lobe. Advise noncontrast
enhanced chest CT to further evaluate. Reticular interstitial
disease with superimposed emphysematous change. Stable scarring
right mid and upper lung zones.

These results will be called to the ordering clinician or
representative by the Radiologist Assistant, and communication
documented in the PACS or zVision Dashboard.

## 2016-02-03 ENCOUNTER — Encounter (HOSPITAL_COMMUNITY): Payer: Self-pay | Admitting: Nurse Practitioner

## 2016-02-03 ENCOUNTER — Emergency Department (HOSPITAL_COMMUNITY)
Admission: EM | Admit: 2016-02-03 | Discharge: 2016-02-03 | Disposition: A | Discharge status: Home / Self Care | Payer: Medicare Other | Attending: Emergency Medicine | Admitting: Emergency Medicine

## 2016-02-03 DIAGNOSIS — Z8719 Personal history of other diseases of the digestive system: Secondary | ICD-10-CM | POA: Insufficient documentation

## 2016-02-03 DIAGNOSIS — F17210 Nicotine dependence, cigarettes, uncomplicated: Secondary | ICD-10-CM | POA: Diagnosis not present

## 2016-02-03 DIAGNOSIS — Z880 Allergy status to penicillin: Secondary | ICD-10-CM | POA: Insufficient documentation

## 2016-02-03 DIAGNOSIS — R319 Hematuria, unspecified: Secondary | ICD-10-CM | POA: Diagnosis not present

## 2016-02-03 DIAGNOSIS — Z862 Personal history of diseases of the blood and blood-forming organs and certain disorders involving the immune mechanism: Secondary | ICD-10-CM | POA: Diagnosis not present

## 2016-02-03 DIAGNOSIS — Z79899 Other long term (current) drug therapy: Secondary | ICD-10-CM | POA: Diagnosis not present

## 2016-02-03 DIAGNOSIS — Z91040 Latex allergy status: Secondary | ICD-10-CM | POA: Diagnosis not present

## 2016-02-03 DIAGNOSIS — Z86010 Personal history of colonic polyps: Secondary | ICD-10-CM | POA: Insufficient documentation

## 2016-02-03 DIAGNOSIS — J45909 Unspecified asthma, uncomplicated: Secondary | ICD-10-CM | POA: Diagnosis not present

## 2016-02-03 LAB — URINALYSIS, ROUTINE W REFLEX MICROSCOPIC
Glucose, UA: NEGATIVE mg/dL
Ketones, ur: 15 mg/dL — AB
NITRITE: NEGATIVE
Protein, ur: 30 mg/dL — AB
SPECIFIC GRAVITY, URINE: 1.018 (ref 1.005–1.030)
pH: 6 (ref 5.0–8.0)

## 2016-02-03 LAB — URINE MICROSCOPIC-ADD ON

## 2016-02-03 MED ORDER — NITROFURANTOIN MONOHYD MACRO 100 MG PO CAPS: 100.0000 mg | ORAL_CAPSULE | Freq: Two times a day (BID) | ORAL | Status: DC

## 2016-02-03 NOTE — ED Notes (Signed)
She reports 2 episodes of painless hematuria since waking this am. She feels she is urinating less frequently than normal. Denies fevers, n/v.

## 2016-02-03 NOTE — Discharge Instructions (Signed)

## 2016-02-03 NOTE — ED Provider Notes (Signed)
CSN: [id number]     Arrival date & time 02/03/16  1226 History  By signing my name below, I, Placido SouLogan Joldersma, attest that this documentation has been prepared under the direction and in the presence of Arthor CaptainAbigail Esdras Delair, PA-C. Electronically Signed: Placido SouLogan Joldersma, ED Scribe. 02/03/2016. 2:52 PM.    Chief Complaint  Patient presents with  . Hematuria   The history is provided by the patient. No language interpreter was used.   HPI Comments: Stephanie Stephens is a 50 y.o. female with a PMHx of diverticulosis, colon polyps, hernia repair, abd hysterectomy, sarcoidosis and vesicovaginal fistula closure who presents to the Emergency Department complaining of mild hematuria onset this morning. She also reports a mild HA with recent onset but believes it is due to hunger. Pt notes a hx of UTI with her most recent in 2011. She denies any other urinary symptoms, abd pain, flank pain or other associated symptoms at this time.    Past Medical History  Diagnosis Date  . Asthma   . Sarcoidosis (HCC)   . Diverticulosis   . Colon polyps    Past Surgical History  Procedure Laterality Date  . Cesarean section      x 3  . Cholecystectomy      lap  . Hernia repair      x 4  . Tonsillectomy    . Vesicovaginal fistula closure w/ tah      fibroids  . Abdominal hysterectomy     Family History  Problem Relation Age of Onset  . Ovarian cancer Mother   . Colon polyps Mother   . Breast cancer Sister   . Colon cancer Father    Social History  Substance Use Topics  . Smoking status: Current Every Day Smoker -- 0.50 packs/day    Types: Cigarettes  . Smokeless tobacco: Never Used  . Alcohol Use: No   OB History    No data available     Review of Systems  Gastrointestinal: Negative for abdominal pain.  Genitourinary: Positive for hematuria. Negative for dysuria, urgency, frequency, flank pain, decreased urine volume and difficulty urinating.    Allergies  Ciprofloxacin; Latex; and  Penicillins  Home Medications   Prior to Admission medications   Medication Sig Start Date End Date Taking? Authorizing Provider  albuterol (PROVENTIL HFA;VENTOLIN HFA) 108 (90 BASE) MCG/ACT inhaler Inhale 2 puffs into the lungs every 4 (four) hours as needed. Asthma 01/13/14 08/17/15  Waymon Budgelinton D Young, MD  albuterol (PROVENTIL) (2.5 MG/3ML) 0.083% nebulizer solution USE 1 VIAL IN NEBULIZER 4 TIMES DAILY. Generic: VENTOLIN 01/28/15   Waymon Budgelinton D Young, MD  budesonide-formoterol (SYMBICORT) 160-4.5 MCG/ACT inhaler Take 2 puffs first thing in am and then another 2 puffs about 12 hours later. 04/25/15   Nyoka CowdenMichael B Wert, MD  Multiple Vitamin (MULTIVITAMIN WITH MINERALS) TABS Take 1 tablet by mouth daily.    Historical Provider, MD  traMADol (ULTRAM) 50 MG tablet Take 1 tablet (50 mg total) by mouth every 6 (six) hours as needed. 09/08/14   Junius FinnerErin O'Malley, PA-C   BP 130/78 mmHg  Pulse 67  Temp(Src) 98.6 F (37 C) (Oral)  Resp 17  SpO2 100%    Physical Exam  Constitutional: She is oriented to person, place, and time. She appears well-developed and well-nourished.  HENT:  Head: Normocephalic and atraumatic.  Eyes: EOM are normal.  Neck: Normal range of motion.  Cardiovascular: Normal rate.   Pulmonary/Chest: Effort normal. No respiratory distress.  Abdominal: Soft. There is no tenderness.  There is no CVA tenderness.  No CVA tenderness  Musculoskeletal: Normal range of motion.  Neurological: She is alert and oriented to person, place, and time.  Skin: Skin is warm and dry.  Psychiatric: She has a normal mood and affect.  Nursing note and vitals reviewed.   ED Course  Procedures  DIAGNOSTIC STUDIES: Oxygen Saturation is 100% on RA, normal by my interpretation.    COORDINATION OF CARE: 2:49 PM Discussed next steps with pt. She verbalized understanding and is agreeable with the plan.   Labs Review Labs Reviewed  URINALYSIS, ROUTINE W REFLEX MICROSCOPIC (NOT AT [facility])    Imaging Review No  results found. I have personally reviewed and evaluated these lab results as part of my medical decision-making.   EKG Interpretation None      MDM   Final diagnoses:  Hematuria    Patient with hematuria. Will treat as UTI. I have sent urine for culture. ? Worsening sarcoidosis. Patient should follow up with her primary care doctor. Pt is afebrile, no CVA tenderness, normotensive, and denies N/V. Pt to be dc home with antibiotics and instructions to follow up with PCP if symptoms persist.   I personally performed the services described in this documentation, which was scribed in my presence. The recorded information has been reviewed and is accurate.        Arthor Captain, PA-C 02/03/16 1610  Glynn Octave, MD 02/03/16 [phone]

## 2016-02-05 LAB — URINE CULTURE

## 2016-02-07 ENCOUNTER — Other Ambulatory Visit: Payer: Self-pay | Admitting: Internal Medicine

## 2016-02-08 ENCOUNTER — Ambulatory Visit (INDEPENDENT_AMBULATORY_CARE_PROVIDER_SITE_OTHER): Payer: Medicare Other | Admitting: Internal Medicine

## 2016-02-08 ENCOUNTER — Other Ambulatory Visit (INDEPENDENT_AMBULATORY_CARE_PROVIDER_SITE_OTHER): Payer: Medicare Other

## 2016-02-08 ENCOUNTER — Ambulatory Visit (INDEPENDENT_AMBULATORY_CARE_PROVIDER_SITE_OTHER)
Admission: RE | Admit: 2016-02-08 | Discharge: 2016-02-08 | Disposition: A | Discharge status: Home / Self Care | Payer: Medicare Other | Source: Ambulatory Visit | Attending: Internal Medicine | Admitting: Internal Medicine

## 2016-02-08 ENCOUNTER — Encounter: Payer: Self-pay | Admitting: Internal Medicine

## 2016-02-08 VITALS — BP 124/80 | HR 99 | Ht 64.0 in | Wt 148.4 lb

## 2016-02-08 DIAGNOSIS — D869 Sarcoidosis, unspecified: Secondary | ICD-10-CM | POA: Diagnosis not present

## 2016-02-08 DIAGNOSIS — H1013 Acute atopic conjunctivitis, bilateral: Secondary | ICD-10-CM | POA: Diagnosis not present

## 2016-02-08 DIAGNOSIS — J849 Interstitial pulmonary disease, unspecified: Secondary | ICD-10-CM | POA: Diagnosis not present

## 2016-02-08 DIAGNOSIS — J309 Allergic rhinitis, unspecified: Secondary | ICD-10-CM

## 2016-02-08 DIAGNOSIS — R590 Localized enlarged lymph nodes: Secondary | ICD-10-CM | POA: Diagnosis not present

## 2016-02-08 LAB — CBC WITH DIFFERENTIAL/PLATELET
BASOS PCT: 0.2 % (ref 0.0–3.0)
Basophils Absolute: 0 10*3/uL (ref 0.0–0.1)
EOS PCT: 5.5 % — AB (ref 0.0–5.0)
Eosinophils Absolute: 0.4 10*3/uL (ref 0.0–0.7)
HEMATOCRIT: 39.1 % (ref 36.0–46.0)
Hemoglobin: 12.8 g/dL (ref 12.0–15.0)
LYMPHS PCT: 22 % (ref 12.0–46.0)
Lymphs Abs: 1.5 10*3/uL (ref 0.7–4.0)
MCHC: 32.7 g/dL (ref 30.0–36.0)
MCV: 94.8 fl (ref 78.0–100.0)
MONOS PCT: 9.7 % (ref 3.0–12.0)
Monocytes Absolute: 0.6 10*3/uL (ref 0.1–1.0)
Neutro Abs: 4.2 10*3/uL (ref 1.4–7.7)
Neutrophils Relative %: 62.6 % (ref 43.0–77.0)
PLATELETS: 280 10*3/uL (ref 150.0–400.0)
RBC: 4.12 Mil/uL (ref 3.87–5.11)
RDW: 13.8 % (ref 11.5–15.5)
WBC: 6.7 10*3/uL (ref 4.0–10.5)

## 2016-02-08 NOTE — Patient Instructions (Addendum)
Ok to take an antihistamine like claritin for allergy and nasal drainage if needed  Order- lab- CBC w diff    Dx sarcoid             CXR    Dx hx sarcoid

## 2016-02-08 NOTE — Progress Notes (Signed)
04/11/12- 50 yoF former smoker followed for sarcoid, allergic rhinitis, allergic conjunctivitis, complicated by GERD LOV-01/26/2011 Smokes on and off "stress" after her aunt died. Or shortness of breath in the last month attributed to pollen bothering her asthma. Mild wheeze. Used her nebulizer. Incidentally took prednisone for gout in her foot, which also helped her asthma. Chest x-ray 02/05/2011-stable Labs from 01/26/2011 reviewed. Unremarkable. Good liver and renal function  06/15/13-  50 yoF  smoker followed for sarcoid, allergic rhinitis, allergic conjunctivitis, complicated by GERD FOLLOWS FOR: pt reports she is more short of breath, vision is becoming more blurring, increasing joint pain, and scratchy throat (has been smoking "some") Admits smoking 2 packs per week. Had labs at her PCP in [place], where she lives, raising question of sarcoid activity. She does not have those labs and asks that we repeat. No real change in occasional night sweats since menopause. No adenopathy or rash. Some shortness of breath, scratchy throat, aching of knees and hips.  09/28/13- 50 yoF  smoker followed for sarcoid, allergic rhinitis, allergic conjunctivitis, complicated by GERD ACUTE VISIT: just not feeling well; started abx on 09-23-13 for pna per CXR. Continues to have SOB and fatigued. On 09/14/13 she called reporting sharp back pain, malaise.  CXR 11/3> RLL pneumonia. We called in Biaxin, refilled neb albuterol Rx. C/O L hip joint pain and aches both calves. Denies cough, fever, phlegm. Attributes sweats to her hot flashes.  Pain w/ deep breath across anterior chest under breasts x 2 weeks "sharp, sticking". ACE 05/2013- 56H CXR 09/14/13 IMPRESSION:  New medial right basilar infiltrate consistent with pneumonia.  Underlying emphysematous changes and right upper lobe scarring.  Electronically Signed  By: Ulyses Southward M.D.  On: 09/21/2013 15:44  10/06/13- 50 yoF  Former smoker followed for sarcoid,  allergic rhinitis, allergic conjunctivitis, complicated by GERD Pt states she found a knot in her neck(has been there for a few months now). Pt is having SOB as well Found posterior left cervical node. Had asthma attack at work 4 days ago. She called [place] reporting shortness of breath and sharp right lateral chest wall pain with deep breath. No leg cramps or swelling and no history of DVT. She is finishing prednisone and completed antibiotic. Quit smoking last month. Peripheral parenchymal density right lower lobe-pending chest CT in this smoker with history of sarcoid.  11/13/13- 50 yoF  Former smoker followed for sarcoid, allergic rhinitis, allergic conjunctivitis, complicated by GERD, cervical node,  Follows for- pt states she still has SOB not always with exertion.  Pt has no other complaints at this time. Did not get CT chest- did CXR instead Thighs hurt walking. No other myalgias, fever, cough or chest pain. Finish prednisone weeks ago. CXR 09/28/13 IMPRESSION:  There has been slight interval worsening in the appearance of the  right lower lobe infiltrate posteriorly. Otherwise the examination  is unchanged.  Electronically Signed  By: David [place]  On: 09/28/2013 16:52  03/15/14- 50 yoF  Former smoker followed for Sarcoid, allergic rhinitis, allergic conjunctivitis, complicated by GERD, cervical node,  FOLLOWS FOR:  Increase sob, wheezing and tightness in chest since pollen came out. ACE level 11/03/13- 53 Blames spring pollen for nasal congestion, with watering eyes, chest tightness with cough. Using Flovent 220 twice daily and rescue inhaler, nebulizer twice daily.  06/29/14-50 yoF  Former smoker followed for Sarcoid, allergic rhinitis, allergic conjunctivitis, complicated by GERD, cervical node,  Saw Dr Vassie Loll 8/6 for recent increased dyspnea. Didn't take recommended prednisone because she didn't  want to gain weight. Pending CT   CXR 06/24/14 IMPRESSION:  1.2 x 1.0 cm nodular opacity  right lower lobe. Advise noncontrast  enhanced chest CT to further evaluate. Reticular interstitial  disease with superimposed emphysematous change. Stable scarring  right mid and upper lung zones.  These results will be called to the ordering clinician or  representative by the Radiologist Assistant, and communication  documented in the PACS or zVision Dashboard.    On: 06/24/2014 10:25  08/16/14- 50 yoF  Former smoker followed for Sarcoid, allergic rhinitis, allergic conjunctivitis, complicated by GERD, cervical node, FOLLOWS FOR: Breathing is doing okay-increased stress lately as well. rec Flu vaccine As long as you continue Dulera, you don't need to also take Flovent/ fluticasone  CT chest 07/02/14 IMPRESSION: 1. No findings to explain the questioned abnormality on recent chest radiograph. 2. Pulmonary parenchymal pattern of perilymphatic nodularity and interstitial coarsening, together with mild mediastinal adenopathy, most consistent with sarcoid. Electronically Signed  By: Leanna Battles M.D.  On: 07/02/2014 12:39  12/07/2014 acute ov/Wert re:  Cough / sob still smoking  Chief Complaint  Patient presents with  . Acute Visit    Pt c/o increased SOB for the past wk. She states that she feels a "burning sensation in chest when I breathe".  She also c/o dizziness. She has had prod cough with green, blood tinged sputum. She moved into a new home 6 days ago, and woke up this am and noticed a muchroom growing out of her carpet.   cough x 2weeks, turned green x one week  maint dulera 200 2bid with baseline need for saba both hfa/neb at least twice daily but now up to 4 x daily for sob assoc with subj wheeze/ chest tightness improves transiently  No obvious other patterns in  day to day or daytime variabilty or assoc cp or    overt sinus or hb symptoms. No unusual exp hx or h/o childhood pna/ asthma or knowledge of premature birth.  Also denies any obvious fluctuation of symptoms  with weather or environmental changes or other aggravating or alleviating factors except as outlined above    01/20/15- 50 yoF  Former smoker followed for Sarcoid, allergic rhinitis, allergic conjunctivitis, complicated by GERD, cervical node, ACUTE VISIT: Pt states she has mold in her apt and landlord will not take care of this. She has continued to have issues with her breathing and headaches. She is trying to get help with this. MW put her Symbicort has helped slightly.  02/13/15- 76 yoF  Former smoker followed for Sarcoid, allergic rhinitis, allergic conjunctivitis, complicated by GERD, cervical node, Pt states she is still having bad headaches. She now has mushrooms growing thru her carpet. Pt has cough with clear to yellow mucus. Allergy profile 01/20/15- Neg, IgE 22 ACE level 01/20/15- stable at 53 c/w 1 year ago  02/08/2016-50 year old female former smoker followed for Sarcoid, allergic rhinitis, allergic conjunctivitis, accompanied by GERD, cervical node ACUTE VISIT: ED visit recently for blood in urine; Was told to follow up here for sarcoidosis On Macrobid for UTI. ER apparently implied that hematuria might be caused by her sarcoid. She has some stable cough with clear mucus, denies fever, night sweat, chill, adenopathy, rash. Recent stuffy nose.  ROS-see HPI   Negative unless "+" Constitutional:    weight loss, night sweats, fevers, chills, fatigue, lassitude. HEENT:    headaches, difficulty swallowing, tooth/dental problems, sore throat,       sneezing, itching, ear ache, + nasal congestion, post  nasal drip, snoring CV:    chest pain, orthopnea, PND, swelling in lower extremities, anasarca,                                                     dizziness, palpitations Resp:   shortness of breath with exertion or at rest.                + productive cough,   non-productive cough, coughing up of blood.              change in color of mucus.  wheezing.   Skin:    rash or lesions. GI:  No-    heartburn, indigestion, abdominal pain, nausea, vomiting, diarrhea,                 change in bowel habits, loss of appetite GU: dysuria, change in color of urine, no urgency or frequency.   flank pain. MS:   joint pain, stiffness, decreased range of motion, back pain. Neuro-     nothing unusual Psych:  change in mood or affect.  depression or anxiety.   memory loss.  OBJ- Physical Exam General- Alert, Oriented, Affect-appropriate, Distress- none acute Skin- rash-none, lesions- none, excoriation- none Lymphadenopathy- none Head- atraumatic            Eyes- Gross vision intact, PERRLA, conjunctivae and secretions clear            Ears- Hearing, canals-normal            Nose- + turbinate edema, no-Septal dev, mucus, polyps, erosion, perforation             Throat- Mallampati II , mucosa clear , drainage- none, tonsils- atrophic Neck- flexible , trachea midline, no stridor , thyroid nl, carotid no bruit Chest - symmetrical excursion , unlabored           Heart/CV- RRR , no murmur , no gallop  , no rub, nl s1 s2                           - JVD- none , edema- none, stasis changes- none, varices- none           Lung- clear to P&A, wheeze- none, cough- none , dullness-none, rub- none           Chest wall-  Abd-  Br/ Gen/ Rectal- Not done, not indicated Extrem- cyanosis- none, clubbing, none, atrophy- none, strength- nl Neuro- grossly intact to observation

## 2016-02-12 NOTE — Assessment & Plan Note (Signed)
Mild seasonal pollen exacerbation of rhinitis. Plan-Claritin. Can add Flonase if needed

## 2016-02-12 NOTE — Assessment & Plan Note (Signed)
I doubt recent hematuria was related to sarcoid. We will check status. Plan-lab for ACE level, chest x-ray

## 2016-05-25 ENCOUNTER — Emergency Department (HOSPITAL_COMMUNITY)
Admission: EM | Admit: 2016-05-25 | Discharge: 2016-05-26 | Disposition: A | Discharge status: Home / Self Care | Payer: Medicare Other | Attending: Emergency Medicine | Admitting: Emergency Medicine

## 2016-05-25 ENCOUNTER — Encounter (HOSPITAL_COMMUNITY): Payer: Self-pay

## 2016-05-25 DIAGNOSIS — F1290 Cannabis use, unspecified, uncomplicated: Secondary | ICD-10-CM | POA: Diagnosis not present

## 2016-05-25 DIAGNOSIS — J45909 Unspecified asthma, uncomplicated: Secondary | ICD-10-CM | POA: Diagnosis not present

## 2016-05-25 DIAGNOSIS — Z79899 Other long term (current) drug therapy: Secondary | ICD-10-CM | POA: Insufficient documentation

## 2016-05-25 DIAGNOSIS — K625 Hemorrhage of anus and rectum: Secondary | ICD-10-CM | POA: Diagnosis not present

## 2016-05-25 DIAGNOSIS — Z91040 Latex allergy status: Secondary | ICD-10-CM | POA: Diagnosis not present

## 2016-05-25 DIAGNOSIS — Z87891 Personal history of nicotine dependence: Secondary | ICD-10-CM | POA: Diagnosis not present

## 2016-05-25 NOTE — ED Notes (Signed)
Patient c/o of rectal bleeding that began today.  Patient states that "passed gas" and went to go into the restroom and stated that visualized bright red blood in the toilet, no stool.  Patient denies pain.  Denies lightheadedness, denies dizziness.  Denies N/V/D. States that has felt more tired lately.  Patient states that has not had an appetite.    Patient states that has history of diverticulitis in the past and "thinks" she had a section of her intestines cut in the past.

## 2016-05-26 LAB — CBC
HCT: 40.8 % (ref 36.0–46.0)
HEMOGLOBIN: 13.4 g/dL (ref 12.0–15.0)
MCH: 30.9 pg (ref 26.0–34.0)
MCHC: 32.8 g/dL (ref 30.0–36.0)
MCV: 94.2 fL (ref 78.0–100.0)
PLATELETS: 289 10*3/uL (ref 150–400)
RBC: 4.33 MIL/uL (ref 3.87–5.11)
RDW: 13.6 % (ref 11.5–15.5)
WBC: 8 10*3/uL (ref 4.0–10.5)

## 2016-05-26 LAB — COMPREHENSIVE METABOLIC PANEL
ALK PHOS: 86 U/L (ref 38–126)
ALT: 15 U/L (ref 14–54)
ANION GAP: 6 (ref 5–15)
AST: 28 U/L (ref 15–41)
Albumin: 4.4 g/dL (ref 3.5–5.0)
BUN: 9 mg/dL (ref 6–20)
CALCIUM: 10 mg/dL (ref 8.9–10.3)
CO2: 27 mmol/L (ref 22–32)
CREATININE: 0.91 mg/dL (ref 0.44–1.00)
Chloride: 107 mmol/L (ref 101–111)
Glucose, Bld: 90 mg/dL (ref 65–99)
Potassium: 3.8 mmol/L (ref 3.5–5.1)
SODIUM: 140 mmol/L (ref 135–145)
TOTAL PROTEIN: 7.7 g/dL (ref 6.5–8.1)
Total Bilirubin: 0.6 mg/dL (ref 0.3–1.2)

## 2016-05-26 LAB — TYPE AND SCREEN
ABO/RH(D): O POS
ANTIBODY SCREEN: NEGATIVE

## 2016-05-26 LAB — ABO/RH: ABO/RH(D): O POS

## 2016-05-26 MED ORDER — DOCUSATE SODIUM 100 MG PO CAPS: 100.0000 mg | ORAL_CAPSULE | Freq: Two times a day (BID) | ORAL | Status: DC

## 2016-05-26 NOTE — Discharge Instructions (Signed)
Gastrointestinal Bleeding See your family doctor for recheck next week. Discuss repeat colonoscopy. Gastrointestinal (GI) bleeding means there is bleeding somewhere along the digestive tract, between the mouth and anus. CAUSES  There are many different problems that can cause GI bleeding. Possible causes include:  Esophagitis. This is inflammation, irritation, or swelling of the esophagus.  Hemorrhoids.These are veins that are full of blood (engorged) in the rectum. They cause pain, inflammation, and may bleed.  Anal fissures.These are areas of painful tearing which may bleed. They are often caused by passing hard stool.  Diverticulosis.These are pouches that form on the colon over time, with age, and may bleed significantly.  Diverticulitis.This is inflammation in areas with diverticulosis. It can cause pain, fever, and bloody stools, although bleeding is rare.  Polyps and cancer. Colon cancer often starts out as precancerous polyps.  Gastritis and ulcers.Bleeding from the upper gastrointestinal tract (near the stomach) may travel through the intestines and produce black, sometimes tarry, often bad smelling stools. In certain cases, if the bleeding is fast enough, the stools may not be black, but red. This condition may be life-threatening. SYMPTOMS   Vomiting bright red blood or material that looks like coffee grounds.  Bloody, black, or tarry stools. DIAGNOSIS  Your caregiver may diagnose your condition by taking your history and performing a physical exam. More tests may be needed, including:  X-rays and other imaging tests.  Esophagogastroduodenoscopy (EGD). This test uses a flexible, lighted tube to look at your esophagus, stomach, and small intestine.  Colonoscopy. This test uses a flexible, lighted tube to look at your colon. TREATMENT  Treatment depends on the cause of your bleeding.   For bleeding from the esophagus, stomach, small intestine, or colon, the caregiver  doing your EGD or colonoscopy may be able to stop the bleeding as part of the procedure.  Inflammation or infection of the colon can be treated with medicines.  Many rectal problems can be treated with creams, suppositories, or warm baths.  Surgery is sometimes needed.  Blood transfusions are sometimes needed if you have lost a lot of blood. If bleeding is slow, you may be allowed to go home. If there is a lot of bleeding, you will need to stay in the hospital for observation. HOME CARE INSTRUCTIONS   Take any medicines exactly as prescribed.  Keep your stools soft by eating foods that are high in fiber. These foods include whole grains, legumes, fruits, and vegetables. Prunes (1 to 3 a day) work well for many people.  Drink enough fluids to keep your urine clear or pale yellow. SEEK IMMEDIATE MEDICAL CARE IF:   Your bleeding increases.  You feel lightheaded, weak, or you faint.  You have severe cramps in your back or abdomen.  You pass large blood clots in your stool.  Your problems are getting worse. MAKE SURE YOU:   Understand these instructions.  Will watch your condition.  Will get help right away if you are not doing well or get worse.   This information is not intended to replace advice given to you by your health care provider. Make sure you discuss any questions you have with your health care provider.   Document Released: 11/02/2000 Document Revised: 10/22/2012 Document Reviewed: 04/25/2015 Elsevier Interactive Patient Education [employer]2016 Elsevier Inc.

## 2016-05-26 NOTE — ED Provider Notes (Signed)
CSN: [id number]     Arrival date & time 05/25/16  2318 History    By signing my name below, I, Suzan SlickAshley N. Elon SpannerLeger, attest that this documentation has been prepared under the direction and in the presence of Arby BarretteMarcy Lavan Imes, MD.  Electronically Signed: Suzan SlickAshley N. Elon SpannerLeger, ED Scribe. 05/26/2016. 1:03 AM.   Chief Complaint  Patient presents with  . Rectal Bleeding   The history is provided by the patient. No language interpreter was used.    HPI Comments: Stephanie Stephens is a 50 y.o. female with a PMHx of colon polyps and diverticulitis who presents to the Emergency Department complaining of intermittent, unchanged rectal bleeding x 3 weeks. Pt states she has noted bright red blood when passing gas. No associated pain reported. Denies having to strain with bowel movements. No prior evaluation for this concern. No recent fever, chills, nausea, vomiting, diarrhea, constipation, lightheadedness, or dizziness. Last colonoscopy last year which revealed some benign polyps which were removed without any complications.  PCP: No PCP Per Patient    Past Medical History  Diagnosis Date  . Asthma   . Sarcoidosis (HCC)   . Diverticulosis   . Colon polyps    Past Surgical History  Procedure Laterality Date  . Cesarean section      x 3  . Cholecystectomy      lap  . Hernia repair      x 4  . Tonsillectomy    . Vesicovaginal fistula closure w/ tah      fibroids  . Abdominal hysterectomy     Family History  Problem Relation Age of Onset  . Ovarian cancer Mother   . Colon polyps Mother   . Breast cancer Sister   . Colon cancer Father    Social History  Substance Use Topics  . Smoking status: Former Smoker -- 0.50 packs/day    Types: Cigarettes    Quit date: 11/19/2014  . Smokeless tobacco: Never Used  . Alcohol Use: 0.0 oz/week    0 Standard drinks or equivalent per week     Comment: daily the last month   OB History    No data available     Review of Systems  A complete 10 system  review of systems was obtained and all systems are negative except as noted in the HPI and PMH.    Allergies  Ciprofloxacin; Latex; and Penicillins  Home Medications   Prior to Admission medications   Medication Sig Start Date End Date Taking? Authorizing Provider  albuterol (PROVENTIL HFA;VENTOLIN HFA) 108 (90 BASE) MCG/ACT inhaler Inhale 2 puffs into the lungs every 4 (four) hours as needed. Asthma 01/13/14 02/08/16  Waymon Budgelinton D Young, MD  albuterol (PROVENTIL) (2.5 MG/3ML) 0.083% nebulizer solution USE 1 VIAL IN NEBULIZER 4 TIMES DAILY. Generic: VENTOLIN 02/07/16   Waymon Budgelinton D Young, MD  budesonide-formoterol (SYMBICORT) 160-4.5 MCG/ACT inhaler Take 2 puffs first thing in am and then another 2 puffs about 12 hours later. 04/25/15   Nyoka CowdenMichael B Wert, MD  docusate sodium (COLACE) 100 MG capsule Take 1 capsule (100 mg total) by mouth every 12 (twelve) hours. 05/26/16   Arby BarretteMarcy Josef Tourigny, MD  Multiple Vitamin (MULTIVITAMIN WITH MINERALS) TABS Take 1 tablet by mouth daily.    Historical Provider, MD  nitrofurantoin, macrocrystal-monohydrate, (MACROBID) 100 MG capsule Take 1 capsule (100 mg total) by mouth 2 (two) times daily. 02/03/16   Arthor CaptainAbigail Harris, PA-C   Triage Vitals: BP 152/75 mmHg  Pulse 73  Temp(Src) 98.4 F (36.9 C) (  Oral)  Resp 15  SpO2 100%   Physical Exam  Constitutional: She is oriented to person, place, and time. She appears well-developed and well-nourished.  HENT:  Head: Normocephalic.  1 cm mobile isolated cyst to the L parietal scalp.  No overlying skin changes or erythema.  Eyes: EOM are normal.  Neck: Normal range of motion.  Cardiovascular: Normal rate, regular rhythm and normal heart sounds.  Exam reveals no gallop and no friction rub.   No murmur heard. Pulmonary/Chest: Effort normal and breath sounds normal. She has no wheezes. She has no rales.  Abdominal: Soft. She exhibits no distension. There is no tenderness. There is no rebound and no guarding.  Genitourinary:  1  non-thrombose hemorrhoid noted. No stool in rectal vault. Trace brown stool without any visible blood.  Musculoskeletal: Normal range of motion. She exhibits no edema.  No peripheral edema. No calf tenderness noted.  Neurological: She is alert and oriented to person, place, and time.  Psychiatric: She has a normal mood and affect.  Nursing note and vitals reviewed.   ED Course  Procedures (including critical care time)  DIAGNOSTIC STUDIES: Oxygen Saturation is 99% on RA, Normal by my interpretation.    COORDINATION OF CARE: 1:02 AM- Will order blood work. Discussed treatment plan with pt at bedside and pt agreed to plan.     Labs Review Labs Reviewed  COMPREHENSIVE METABOLIC PANEL  CBC  TYPE AND SCREEN  ABO/RH    Imaging Review No results found. I have personally reviewed and evaluated these images and lab results as part of my medical decision-making.   EKG Interpretation None      MDM   Final diagnoses:  Rectal bleeding   Patient reports rectal bleeding. H/O colonoscopy approx one year ago by report. H&H stable, VS sable, no melena or visible bleed on rectal exam. Suspect hemorhoid bleeding or diverticulosis based on hx and PE. Patient stable, reviewed signs/sx for which to return. Counseled on necessity of f/u with PCP to determine if repeat colonoscopy indicated.  Arby Barrette, MD 05/29/16 [phone]

## 2016-07-05 ENCOUNTER — Encounter (HOSPITAL_COMMUNITY): Payer: Self-pay | Admitting: Family Medicine

## 2016-07-05 ENCOUNTER — Ambulatory Visit (HOSPITAL_COMMUNITY)
Admission: EM | Admit: 2016-07-05 | Discharge: 2016-07-05 | Disposition: A | Discharge status: Home / Self Care | Payer: Medicare Other | Attending: Family Medicine | Admitting: Family Medicine

## 2016-07-05 DIAGNOSIS — L729 Follicular cyst of the skin and subcutaneous tissue, unspecified: Secondary | ICD-10-CM | POA: Diagnosis not present

## 2016-07-05 MED ORDER — HYDROCODONE-ACETAMINOPHEN 5-325 MG PO TABS: 1.0000 | ORAL_TABLET | Freq: Four times a day (QID) | ORAL | 0 refills | Status: DC | PRN

## 2016-07-05 MED ORDER — DOXYCYCLINE HYCLATE 100 MG PO TABS: 100.0000 mg | ORAL_TABLET | Freq: Two times a day (BID) | ORAL | 0 refills | Status: DC

## 2016-07-05 NOTE — ED Triage Notes (Signed)
Pt here for cyst to the left side of head that has been there a year but over the past 2 days has worsened and became very painful. Cyst movable

## 2016-07-05 NOTE — Discharge Instructions (Signed)
[facility] or return here tomorrow

## 2016-07-05 NOTE — ED Provider Notes (Signed)
[facility]    CSN: [id number] Arrival date & time: 07/05/16  1911  First Provider Contact:  First MD Initiated Contact with Patient 07/05/16 1945        History   Chief Complaint Chief Complaint  Patient presents with  . Cyst    HPI Stephanie Stephens is a 50 y.o. female.   This a 50 year old woman who presents with pain in the scalp the last week. She's had a known cyst on the left parieto-occipital area for a year but is rapidly gotten bigger as the pain has increased as well.  She's had no ear pain or fever.      Past Medical History:  Diagnosis Date  . Asthma   . Colon polyps   . Diverticulosis   . Sarcoidosis [facility](HCC)     Patient Active Problem List   Diagnosis Date Noted  . CAP (community acquired pneumonia) 10/02/2013  . Cigarette smoker 06/27/2013  . GERD 09/04/2010  . PHARYNGITIS, ACUTE 03/15/2009  . BRONCHITIS 08/24/2008  . ALLERGIC CONJUNCTIVITIS 01/27/2008  . PULMONARY SARCOIDOSIS 10/03/2007  . Allergic conjunctivitis and rhinitis 10/03/2007  . Asthma, mild persistent 10/03/2007    Past Surgical History:  Procedure Laterality Date  . ABDOMINAL HYSTERECTOMY    . CESAREAN SECTION     x 3  . CHOLECYSTECTOMY     lap  . HERNIA REPAIR     x 4  . TONSILLECTOMY    . VESICOVAGINAL FISTULA CLOSURE W/ TAH     fibroids    OB History    No data available       Home Medications    Prior to Admission medications   Medication Sig Start Date End Date Taking? Authorizing Provider  albuterol (PROVENTIL HFA;VENTOLIN HFA) 108 (90 BASE) MCG/ACT inhaler Inhale 2 puffs into the lungs every 4 (four) hours as needed. Asthma 01/13/14 02/08/16  Waymon Budgelinton D Young, MD  albuterol (PROVENTIL) (2.5 MG/3ML) 0.083% nebulizer solution USE 1 VIAL IN NEBULIZER 4 TIMES DAILY. Generic: VENTOLIN 02/07/16   Waymon Budgelinton D Young, MD  budesonide-formoterol (SYMBICORT) 160-4.5 MCG/ACT inhaler Take 2 puffs first thing in am and then another 2 puffs about 12 hours later. 04/25/15    Nyoka CowdenMichael B Wert, MD  docusate sodium (COLACE) 100 MG capsule Take 1 capsule (100 mg total) by mouth every 12 (twelve) hours. 05/26/16   Arby BarretteMarcy Pfeiffer, MD  doxycycline (VIBRA-TABS) 100 MG tablet Take 1 tablet (100 mg total) by mouth 2 (two) times daily. 07/05/16   Elvina SidleKurt Corvin Sorbo, MD  HYDROcodone-acetaminophen (NORCO) 5-325 MG tablet Take 1 tablet by mouth every 6 (six) hours as needed for moderate pain. 07/05/16   Elvina SidleKurt Benedicta Sultan, MD  Multiple Vitamin (MULTIVITAMIN WITH MINERALS) TABS Take 1 tablet by mouth daily.    Historical Provider, MD  nitrofurantoin, macrocrystal-monohydrate, (MACROBID) 100 MG capsule Take 1 capsule (100 mg total) by mouth 2 (two) times daily. 02/03/16   Arthor CaptainAbigail Harris, PA-C    Family History Family History  Problem Relation Age of Onset  . Ovarian cancer Mother   . Colon polyps Mother   . Breast cancer Sister   . Colon cancer Father     Social History Social History  Substance Use Topics  . Smoking status: Former Smoker    Packs/day: 0.50    Types: Cigarettes    Quit date: 11/19/2014  . Smokeless tobacco: Never Used  . Alcohol use 0.0 oz/week     Comment: daily the last month     Allergies   Ciprofloxacin;  Latex; and Penicillins   Review of Systems Review of Systems  Constitutional: Negative.   Eyes: Negative.   Respiratory: Negative.   Cardiovascular: Negative.   Gastrointestinal: Negative.   Neurological: Positive for headaches. Negative for dizziness, facial asymmetry and weakness.  Psychiatric/Behavioral: Negative.      Physical Exam Triage Vital Signs ED Triage Vitals  Enc Vitals Group     BP 07/05/16 1941 145/87     Pulse Rate 07/05/16 1941 66     Resp 07/05/16 1941 18     Temp 07/05/16 1941 98.2 F (36.8 C)     Temp src --      SpO2 07/05/16 1941 100 %     Weight --      Height --      Head Circumference --      Peak Flow --      Pain Score 07/05/16 1942 7     Pain Loc --      Pain Edu? --      Excl. in GC? --    No data  found.   Updated Vital Signs BP 145/87   Pulse 66   Temp 98.2 F (36.8 C)   Resp 18   SpO2 100%   Visual Acuity Right Eye Distance:   Left Eye Distance:   Bilateral Distance:    Right Eye Near:   Left Eye Near:    Bilateral Near:     Physical Exam  Constitutional: She appears well-developed and well-nourished.  HENT:  Head:    Nursing note and vitals reviewed. 2 cm fluctuant cyst left parieto-occipital area, tender, nondraining   UC Treatments / Results  Labs (all labs ordered are listed, but only abnormal results are displayed) Labs Reviewed - No data to display  EKG  EKG Interpretation None       Radiology No results found.  Procedures Procedures (including critical care time)  Medications Ordered in UC Medications - No data to display   Initial Impression / Assessment and Plan / UC Course  I have reviewed the triage vital signs and the nursing notes.  Pertinent labs & imaging results that were available during my care of the patient were reviewed by me and considered in my medical decision making (see chart for details).  Clinical Course    Final Clinical Impressions(s) / UC Diagnoses   Final diagnoses:  Scalp cyst    New Prescriptions New Prescriptions   DOXYCYCLINE (VIBRA-TABS) 100 MG TABLET    Take 1 tablet (100 mg total) by mouth 2 (two) times daily.   HYDROCODONE-ACETAMINOPHEN (NORCO) 5-325 MG TABLET    Take 1 tablet by mouth every 6 (six) hours as needed for moderate pain.     Elvina SidleKurt Christiana Gurevich, MD 07/05/16 1958

## 2016-07-07 ENCOUNTER — Other Ambulatory Visit: Payer: Self-pay | Admitting: Internal Medicine

## 2016-07-09 ENCOUNTER — Telehealth: Payer: Self-pay | Admitting: Internal Medicine

## 2016-07-09 MED ORDER — BUDESONIDE-FORMOTEROL FUMARATE 160-4.5 MCG/ACT IN AERO: INHALATION_SPRAY | RESPIRATORY_TRACT | 6 refills | Status: DC

## 2016-07-09 NOTE — Telephone Encounter (Signed)
Called spoke with pt. Aware RX fpr symbicort has been sent in. Nothing further needed

## 2016-08-31 ENCOUNTER — Ambulatory Visit (HOSPITAL_COMMUNITY)
Admission: EM | Admit: 2016-08-31 | Discharge: 2016-08-31 | Disposition: A | Discharge status: Home / Self Care | Payer: Medicare Other | Attending: Emergency Medicine | Admitting: Emergency Medicine

## 2016-08-31 ENCOUNTER — Encounter (HOSPITAL_COMMUNITY): Payer: Self-pay | Admitting: Emergency Medicine

## 2016-08-31 DIAGNOSIS — G44209 Tension-type headache, unspecified, not intractable: Secondary | ICD-10-CM | POA: Diagnosis not present

## 2016-08-31 DIAGNOSIS — R030 Elevated blood-pressure reading, without diagnosis of hypertension: Secondary | ICD-10-CM

## 2016-08-31 MED ORDER — KETOROLAC TROMETHAMINE 60 MG/2ML IM SOLN
INTRAMUSCULAR | Status: AC
  Filled 2016-08-31: qty 2

## 2016-08-31 MED ORDER — KETOROLAC TROMETHAMINE 60 MG/2ML IM SOLN
60.0000 mg | Freq: Once | INTRAMUSCULAR | Status: AC
  Administered 2016-08-31: 60 mg via INTRAMUSCULAR

## 2016-08-31 NOTE — Discharge Instructions (Signed)
Im sorry you have a headache. Your exam is good and you aren't having emergent associated symptoms which is reassuring. Hope the shot will help you. Certainly if your headache worsens severely, or changes in vision then please go to the ER for further imaging of your brain. Feel better.
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
## 2016-12-20 ENCOUNTER — Emergency Department (HOSPITAL_COMMUNITY)
Admission: EM | Admit: 2016-12-20 | Discharge: 2016-12-20 | Disposition: A | Discharge status: Home / Self Care | Payer: Medicare Other | Attending: Emergency Medicine | Admitting: Emergency Medicine

## 2016-12-20 ENCOUNTER — Encounter (HOSPITAL_COMMUNITY): Payer: Self-pay | Admitting: Emergency Medicine

## 2016-12-20 DIAGNOSIS — L500 Allergic urticaria: Secondary | ICD-10-CM | POA: Insufficient documentation

## 2016-12-20 DIAGNOSIS — Z91040 Latex allergy status: Secondary | ICD-10-CM | POA: Insufficient documentation

## 2016-12-20 DIAGNOSIS — T7840XA Allergy, unspecified, initial encounter: Secondary | ICD-10-CM | POA: Diagnosis present

## 2016-12-20 DIAGNOSIS — Z87891 Personal history of nicotine dependence: Secondary | ICD-10-CM | POA: Diagnosis not present

## 2016-12-20 DIAGNOSIS — J45909 Unspecified asthma, uncomplicated: Secondary | ICD-10-CM | POA: Insufficient documentation

## 2016-12-20 MED ORDER — DEXAMETHASONE 4 MG PO TABS
10.0000 mg | ORAL_TABLET | Freq: Once | ORAL | Status: AC
  Administered 2016-12-20: 10 mg via ORAL
  Filled 2016-12-20: qty 2

## 2016-12-20 MED ORDER — FAMOTIDINE 20 MG PO TABS
20.0000 mg | ORAL_TABLET | Freq: Once | ORAL | Status: AC
  Administered 2016-12-20: 20 mg via ORAL
  Filled 2016-12-20: qty 1

## 2016-12-20 MED ORDER — EPINEPHRINE 0.3 MG/0.3ML IJ SOAJ: 0.3000 mg | Freq: Once | INTRAMUSCULAR | 0 refills | Status: AC

## 2016-12-20 MED ORDER — FAMOTIDINE 20 MG PO TABS: 20.0000 mg | ORAL_TABLET | Freq: Two times a day (BID) | ORAL | 0 refills | Status: DC

## 2016-12-20 NOTE — Discharge Instructions (Signed)
1. Take benadryl and pepcid for the next 3 days. 2. Carry epipen with you at all times. If you use epipen for life-threatening allergic reaction, go immediately to ER.

## 2016-12-20 NOTE — ED Provider Notes (Signed)
[facility]   CSN: [id number] Arrival date & time: 12/20/16 1316     History    Chief Complaint  Patient presents with  . Allergic Reaction     HPI Stephanie Stephens is a 51 y.o. female.  18yo F w/ PMH including sarcoidosis who p/w allergic reaction. Yesterday the patient began itching and started having welts on her back. She took Benadryl last night and again this morning at 9:30 AM but has continued to have itching. Today she also had a gradual onset of upper lip swelling. She denies any oral swelling, vomiting, diarrhea. She has chronic shortness of breath related to sarcoidosis but denies any change in that. She used a new lotion 2 days ago for the first time but denies any other food or body product exposures. No one else around her with similar rash. No recent illness.  Past Medical History:  Diagnosis Date  . Asthma   . Colon polyps   . Diverticulosis   . Sarcoidosis [facility])      Patient Active Problem List   Diagnosis Date Noted  . CAP (community acquired pneumonia) 10/02/2013  . Cigarette smoker 06/27/2013  . GERD 09/04/2010  . PHARYNGITIS, ACUTE 03/15/2009  . BRONCHITIS 08/24/2008  . ALLERGIC CONJUNCTIVITIS 01/27/2008  . PULMONARY SARCOIDOSIS 10/03/2007  . Allergic conjunctivitis and rhinitis 10/03/2007  . Asthma, mild persistent 10/03/2007    Past Surgical History:  Procedure Laterality Date  . ABDOMINAL HYSTERECTOMY    . CESAREAN SECTION     x 3  . CHOLECYSTECTOMY     lap  . HERNIA REPAIR     x 4  . TONSILLECTOMY    . VESICOVAGINAL FISTULA CLOSURE W/ TAH     fibroids    OB History    No data available        Home Medications    Prior to Admission medications   Medication Sig Start Date End Date Taking? Authorizing Provider  albuterol (PROVENTIL HFA;VENTOLIN HFA) 108 (90 BASE) MCG/ACT inhaler Inhale 2 puffs into the lungs every 4 (four) hours as needed. Asthma 01/13/14 12/20/16 Yes Waymon Budge, MD  albuterol  (PROVENTIL) (2.5 MG/3ML) 0.083% nebulizer solution USE 1 VIAL IN NEBULIZER 4 TIMES DAILY. Generic: VENTOLIN 02/07/16  Yes Waymon Budge, MD  budesonide-formoterol (SYMBICORT) 160-4.5 MCG/ACT inhaler inhale 2 puffs by mouth FIRST THING IN THE MORNING then 2 puffs 12 HOURS LATER 07/09/16  Yes Waymon Budge, MD  diphenhydrAMINE (BENADRYL) 25 MG tablet Take 50 mg by mouth every 6 (six) hours as needed for itching or allergies.   Yes Historical Provider, MD  Multiple Vitamin (MULTIVITAMIN WITH MINERALS) TABS Take 1 tablet by mouth daily.   Yes Historical Provider, MD  valACYclovir (VALTREX) 500 MG tablet Take 500 mg by mouth 2 (two) times daily.   Yes Historical Provider, MD  XIIDRA 5 % SOLN Place 1 drop into both eyes 2 (two) times daily.  11/29/16  Yes Historical Provider, MD  docusate sodium (COLACE) 100 MG capsule Take 1 capsule (100 mg total) by mouth every 12 (twelve) hours. Patient not taking: Reported on 12/20/2016 05/26/16   Arby Barrette, MD  doxycycline (VIBRA-TABS) 100 MG tablet Take 1 tablet (100 mg total) by mouth 2 (two) times daily. Patient not taking: Reported on 12/20/2016 07/05/16   Elvina Sidle, MD  HYDROcodone-acetaminophen [facility]) 5-325 MG tablet Take 1 tablet by mouth every 6 (six) hours as needed for moderate pain. Patient not taking: Reported on 12/20/2016 07/05/16  Elvina Sidle, MD  nitrofurantoin, macrocrystal-monohydrate, (MACROBID) 100 MG capsule Take 1 capsule (100 mg total) by mouth 2 (two) times daily. Patient not taking: Reported on 12/20/2016 02/03/16   Arthor Captain, PA-C      Family History  Problem Relation Age of Onset  . Ovarian cancer Mother   . Colon polyps Mother   . Breast cancer Sister   . Colon cancer Father      Social History  Substance Use Topics  . Smoking status: Former Smoker    Packs/day: 0.50    Types: Cigarettes    Quit date: 11/19/2014  . Smokeless tobacco: Never Used  . Alcohol use 0.0 oz/week     Comment: daily the last month      Allergies     Ciprofloxacin; Latex; and Penicillins    Review of Systems  10 Systems reviewed and are negative for acute change except as noted in the HPI.   Physical Exam Updated Vital Signs BP 132/79   Pulse 98   Temp 98.4 F (36.9 C)   Resp 16   SpO2 100%   Physical Exam  Constitutional: She is oriented to person, place, and time. She appears well-developed and well-nourished. No distress.  Playing on phone, comfortable  HENT:  Head: Normocephalic and atraumatic.  Moist mucous membranes Uniform upper lip swelling with no oropharyngeal swelling  Eyes: Conjunctivae are normal. Pupils are equal, round, and reactive to light.  Neck: Neck supple.  Cardiovascular: Normal rate, regular rhythm and normal heart sounds.   No murmur heard. Pulmonary/Chest: Effort normal and breath sounds normal.  Abdominal: Soft. Bowel sounds are normal. She exhibits no distension. There is no tenderness.  Musculoskeletal: She exhibits no edema.  Neurological: She is alert and oriented to person, place, and time.  Fluent speech  Skin: Skin is warm and dry.  Scattered small areas of faint urticaria on upper back/posterior shoulders  Psychiatric: She has a normal mood and affect. Judgment normal.  Nursing note and vitals reviewed.     ED Treatments / Results  Labs (all labs ordered are listed, but only abnormal results are displayed) Labs Reviewed - No data to display   EKG  EKG Interpretation  Date/Time:    Ventricular Rate:    PR Interval:    QRS Duration:   QT Interval:    QTC Calculation:   R Axis:     Text Interpretation:           Radiology No results found.  Procedures Procedures (including critical care time) Procedures  Medications Ordered in ED  Medications  dexamethasone (DECADRON) tablet 10 mg (not administered)  famotidine (PEPCID) tablet 20 mg (not administered)     Initial Impression / Assessment and Plan / ED Course  I have reviewed the  triage vital signs and the nursing notes.       Pt w/ itching since last night and upper lip swelling throughout today. She was comfortable and playing on her phone at presentation with normal vital signs, O2 100% on room air. Normal work of breathing and no wheezing. No signs or symptoms to suggest anaphylaxis especially given slow onset of symptoms. Gave Decadron and Pepcid and discussed continuing Benadryl and Pepcid at home. Because of her lip swelling, I did provide with an EpiPen in the event that she has a more severe reaction in the future. I discussed indications for use. Reviewed return precautions including any respiratory problems, vomiting, problems swallowing, or worsening swelling of face or mouth. Patient  voiced understanding and was discharged in satisfactory condition.  Final Clinical Impressions(s) / ED Diagnoses   Final diagnoses:  None     New Prescriptions   No medications on file       Laurence Spatesachel Morgan Little, MD 12/20/16 1432

## 2016-12-20 NOTE — ED Triage Notes (Signed)
Pt reports she began to have generalized body itching and lip swelling last night. Took benedryl last night and again this am. Symptoms continue. No SOB. Drinking water in triage.

## 2017-02-07 ENCOUNTER — Ambulatory Visit: Payer: Medicare Other | Admitting: Internal Medicine

## 2017-02-18 ENCOUNTER — Ambulatory Visit: Payer: Medicare Other | Admitting: Internal Medicine

## 2017-04-10 ENCOUNTER — Encounter: Payer: Self-pay | Admitting: Internal Medicine

## 2017-04-10 ENCOUNTER — Ambulatory Visit (INDEPENDENT_AMBULATORY_CARE_PROVIDER_SITE_OTHER): Payer: Medicare Other | Admitting: Internal Medicine

## 2017-04-10 ENCOUNTER — Other Ambulatory Visit: Payer: Self-pay | Admitting: Internal Medicine

## 2017-04-10 VITALS — BP 112/72 | HR 73 | Resp 18 | Ht 64.0 in | Wt 148.8 lb

## 2017-04-10 DIAGNOSIS — D869 Sarcoidosis, unspecified: Secondary | ICD-10-CM

## 2017-04-10 DIAGNOSIS — J4530 Mild persistent asthma, uncomplicated: Secondary | ICD-10-CM | POA: Diagnosis not present

## 2017-04-10 DIAGNOSIS — Z1231 Encounter for screening mammogram for malignant neoplasm of breast: Secondary | ICD-10-CM

## 2017-04-10 DIAGNOSIS — Z91018 Allergy to other foods: Secondary | ICD-10-CM | POA: Diagnosis not present

## 2017-04-10 DIAGNOSIS — F17210 Nicotine dependence, cigarettes, uncomplicated: Secondary | ICD-10-CM

## 2017-04-10 MED ORDER — ALBUTEROL SULFATE (2.5 MG/3ML) 0.083% IN NEBU: INHALATION_SOLUTION | RESPIRATORY_TRACT | 10 refills | Status: DC

## 2017-04-10 NOTE — Progress Notes (Signed)
HPI   Former smoker followed for Sarcoid, allergic rhinitis, allergic conjunctivitis angioedema,, complicated by GERD, cervical node,  ACE 05/2013- 56H Allergy profile 01/20/15- Neg, IgE 22 ACE level 01/20/15- stable at 53 ----------------------------------------------------------------------  02/08/2016-51 year old female former smoker followed for Sarcoid, allergic rhinitis, allergic conjunctivitis, accompanied by GERD, cervical node ACUTE VISIT: ED visit recently for blood in urine; Was told to follow up here for sarcoidosis On Macrobid for UTI. ER apparently implied that hematuria might be caused by her sarcoid. She has some stable cough with clear mucus, denies fever, night sweat, chill, adenopathy, rash. Recent stuffy nose.  04/10/17- [age] yoF  Former smoker followed for Sarcoid, allergic rhinitis, allergic conjunctivitis, angioedema, complicated by GERD, cervical node, FOLLOW UP FOR 1 year follow up patient has found that she is allergy it fruit.  Had angioedema lip after eating mango and was given EpiPen. No problem with other fruits. She chooses not to eat meat. Otherwise no concerns. Denies routine cough, fever/sweat, adenopathy, rash. Asks referral for mammogram- routine, no concern  ROS-see HPI   Negative unless "+" Constitutional:    weight loss, night sweats, fevers, chills, fatigue, lassitude. HEENT:    headaches, difficulty swallowing, tooth/dental problems, sore throat,       sneezing, itching, ear ache, + nasal congestion, post nasal drip, snoring CV:    chest pain, orthopnea, PND, swelling in lower extremities, anasarca,                                                     dizziness, palpitations Resp:   shortness of breath with exertion or at rest.                + productive cough,   non-productive cough, coughing up of blood.              change in color of mucus.  wheezing.   Skin:    rash or lesions. GI:  No-   heartburn, indigestion, abdominal pain, nausea, vomiting,  diarrhea,                 change in bowel habits, loss of appetite GU: dysuria, change in color of urine, no urgency or frequency.   flank pain. MS:   joint pain, stiffness, decreased range of motion, back pain. Neuro-     nothing unusual Psych:  change in mood or affect.  depression or anxiety.   memory loss.  OBJ- Physical Exam General- Alert, Oriented, Affect-appropriate, Distress- none acute Skin- rash-none, lesions- none, excoriation- none Lymphadenopathy- none Head- atraumatic            Eyes- Gross vision intact, PERRLA, conjunctivae and secretions clear            Ears- Hearing, canals-normal            Nose- + turbinate edema, no-Septal dev, mucus, polyps, erosion, perforation             Throat- Mallampati II , mucosa clear , drainage- none, tonsils- atrophic Neck- flexible , trachea midline, no stridor , thyroid nl, carotid no bruit Chest - symmetrical excursion , unlabored           Heart/CV- RRR , no murmur , no gallop  , no rub, nl s1 s2                           -  JVD- none , edema- none, stasis changes- none, varices- none           Lung- clear to P&A, wheeze- none, cough- none , dullness-none, rub- none           Chest wall- breast without dominant mass or discharge Abd-  Br/ Gen/ Rectal- Not done, not indicated Extrem- cyanosis- none, clubbing, none, atrophy- none, strength- nl Neuro- grossly intact to observation

## 2017-04-10 NOTE — Patient Instructions (Signed)
I'm glad you are doing well  Refill for nebulizer solution sent to drug store  Order- refer for mammograms     Routine  Please call as needed

## 2017-04-15 ENCOUNTER — Encounter: Payer: Self-pay | Admitting: Internal Medicine

## 2017-04-15 DIAGNOSIS — Z91018 Allergy to other foods: Secondary | ICD-10-CM | POA: Insufficient documentation

## 2017-04-15 NOTE — Assessment & Plan Note (Signed)
Clinically stable without symptoms of active disease. We chose to be conservative about diagnostic studies at this visit.

## 2017-04-15 NOTE — Assessment & Plan Note (Signed)
She indicates she has not smoked since 2016. Strongly encouraged to remain free of this habit.

## 2017-04-15 NOTE — Assessment & Plan Note (Signed)
She feels well controlled. We discussed refilling meds.

## 2017-04-15 NOTE — Assessment & Plan Note (Signed)
It is easy for her to avoid mango. If she has further episodes of angioedema then workup can be done.

## 2017-05-03 ENCOUNTER — Ambulatory Visit
Admission: RE | Admit: 2017-05-03 | Discharge: 2017-05-03 | Disposition: A | Discharge status: Home / Self Care | Payer: Medicare Other | Source: Ambulatory Visit | Attending: Internal Medicine | Admitting: Internal Medicine

## 2017-05-03 DIAGNOSIS — Z1231 Encounter for screening mammogram for malignant neoplasm of breast: Secondary | ICD-10-CM

## 2017-08-15 ENCOUNTER — Encounter: Payer: Self-pay | Admitting: Gastroenterology

## 2017-10-03 ENCOUNTER — Encounter: Payer: Self-pay | Admitting: *Deleted

## 2017-10-15 ENCOUNTER — Ambulatory Visit: Payer: Medicare Other | Admitting: Gastroenterology

## 2017-11-14 ENCOUNTER — Ambulatory Visit (INDEPENDENT_AMBULATORY_CARE_PROVIDER_SITE_OTHER)
Admission: RE | Admit: 2017-11-14 | Discharge: 2017-11-14 | Disposition: A | Discharge status: Home / Self Care | Payer: Medicare Other | Source: Ambulatory Visit | Attending: Internal Medicine | Admitting: Internal Medicine

## 2017-11-14 ENCOUNTER — Telehealth: Payer: Self-pay | Admitting: Internal Medicine

## 2017-11-14 ENCOUNTER — Other Ambulatory Visit (INDEPENDENT_AMBULATORY_CARE_PROVIDER_SITE_OTHER): Payer: Medicare Other

## 2017-11-14 DIAGNOSIS — J209 Acute bronchitis, unspecified: Secondary | ICD-10-CM

## 2017-11-14 LAB — CBC WITH DIFFERENTIAL/PLATELET
BASOS PCT: 0.8 % (ref 0.0–3.0)
Basophils Absolute: 0.1 10*3/uL (ref 0.0–0.1)
EOS PCT: 1.4 % (ref 0.0–5.0)
Eosinophils Absolute: 0.1 10*3/uL (ref 0.0–0.7)
HCT: 41.4 % (ref 36.0–46.0)
HEMOGLOBIN: 13.5 g/dL (ref 12.0–15.0)
Lymphocytes Relative: 47.9 % — ABNORMAL HIGH (ref 12.0–46.0)
Lymphs Abs: 3.4 10*3/uL (ref 0.7–4.0)
MCHC: 32.7 g/dL (ref 30.0–36.0)
MCV: 96 fl (ref 78.0–100.0)
MONO ABS: 0.6 10*3/uL (ref 0.1–1.0)
MONOS PCT: 8.2 % (ref 3.0–12.0)
Neutro Abs: 3 10*3/uL (ref 1.4–7.7)
Neutrophils Relative %: 41.7 % — ABNORMAL LOW (ref 43.0–77.0)
Platelets: 297 10*3/uL (ref 150.0–400.0)
RBC: 4.31 Mil/uL (ref 3.87–5.11)
RDW: 13.8 % (ref 11.5–15.5)
WBC: 7.2 10*3/uL (ref 4.0–10.5)

## 2017-11-14 MED ORDER — DOXYCYCLINE HYCLATE 100 MG PO TABS: 100.0000 mg | ORAL_TABLET | Freq: Two times a day (BID) | ORAL | 0 refills | Status: DC

## 2017-11-14 NOTE — Telephone Encounter (Signed)
Spoke with patient. She is aware of CY's recs. Medication has been called into pharmacy. She stated that she will stop by the office today to get a CXR and CBC done. Orders have placed. Nothing else needed at time of call.

## 2017-11-14 NOTE — Telephone Encounter (Signed)
Pt reports of chills, body aches, prod cough thick clear mucus, wheezing, chest discomfort x2d Pt feels that she may have a fever, but has not measured temp.  Pt is concerned about PNA and request apt with CY today.  CY please advise. Thanks.   Current Outpatient Medications on File Prior to Visit  Medication Sig Dispense Refill  . albuterol (PROVENTIL HFA;VENTOLIN HFA) 108 (90 BASE) MCG/ACT inhaler Inhale 2 puffs into the lungs every 4 (four) hours as needed. Asthma 1 Inhaler 2  . albuterol (PROVENTIL) (2.5 MG/3ML) 0.083% nebulizer solution USE 1 VIAL IN NEBULIZER 4 TIMES DAILY. Generic: VENTOLIN 120 vial 10  . budesonide-formoterol (SYMBICORT) 160-4.5 MCG/ACT inhaler inhale 2 puffs by mouth FIRST THING IN THE MORNING then 2 puffs 12 HOURS LATER 10.2 Inhaler 6  . diphenhydrAMINE (BENADRYL) 25 MG tablet Take 50 mg by mouth every 6 (six) hours as needed for itching or allergies.    Marland Kitchen. docusate sodium (COLACE) 100 MG capsule Take 1 capsule (100 mg total) by mouth every 12 (twelve) hours. 60 capsule 0  . doxycycline (VIBRA-TABS) 100 MG tablet Take 1 tablet (100 mg total) by mouth 2 (two) times daily. (Patient not taking: Reported on 12/20/2016) 20 tablet 0  . famotidine (PEPCID) 20 MG tablet Take 1 tablet (20 mg total) by mouth 2 (two) times daily. (Patient not taking: Reported on 04/10/2017) 8 tablet 0  . HYDROcodone-acetaminophen (NORCO) 5-325 MG tablet Take 1 tablet by mouth every 6 (six) hours as needed for moderate pain. (Patient not taking: Reported on 12/20/2016) 12 tablet 0  . Multiple Vitamin (MULTIVITAMIN WITH MINERALS) TABS Take 1 tablet by mouth daily.    . nitrofurantoin, macrocrystal-monohydrate, (MACROBID) 100 MG capsule Take 1 capsule (100 mg total) by mouth 2 (two) times daily. (Patient not taking: Reported on 12/20/2016) 10 capsule 0  . valACYclovir (VALTREX) 500 MG tablet Take 500 mg by mouth 2 (two) times daily.    Marland Kitchen. XIIDRA 5 % SOLN Place 1 drop into both eyes 2 (two) times daily.       No current facility-administered medications on file prior to visit.     Allergies  Allergen Reactions  . Ciprofloxacin Swelling  . Latex Rash  . Penicillins Rash    Has patient had a PCN reaction causing immediate rash, facial/tongue/throat swelling, SOB or lightheadedness with hypotension: No Has patient had a PCN reaction causing severe rash involving mucus membranes or skin necrosis: No Has patient had a PCN reaction that required hospitalization No Has patient had a PCN reaction occurring within the last 10 years: No If all of the above answers are "NO", then may proceed with Cephalosporin use.

## 2017-11-14 NOTE — Telephone Encounter (Signed)
Offer doxycycline 100 mg, # 14, 1 twice daily  Stay well-hydrated  Suggest order outpatient CXR  And CBC w diff  Dx acute bronchitis  Has she had flu shot?

## 2017-11-15 ENCOUNTER — Telehealth: Payer: Self-pay | Admitting: Internal Medicine

## 2017-11-15 NOTE — Telephone Encounter (Signed)
Spoke with the pt and notified of recs per CDY  She verbalized understanding  Nothing further needed 

## 2017-11-15 NOTE — Telephone Encounter (Signed)
"  Results" posted- She likely has had a viral chest cold, but not pneumonia. Treatment can be with otc cold and flu meds as needed.

## 2017-11-15 NOTE — Telephone Encounter (Signed)
Called and spoke with pt and she stated that she is wanting to get the results of her cxr and labs that were completed on 12/27.  CY please advise. Thanks  Allergies  Allergen Reactions  . Ciprofloxacin Swelling  . Latex Rash  . Penicillins Rash    Has patient had a PCN reaction causing immediate rash, facial/tongue/throat swelling, SOB or lightheadedness with hypotension: No Has patient had a PCN reaction causing severe rash involving mucus membranes or skin necrosis: No Has patient had a PCN reaction that required hospitalization No Has patient had a PCN reaction occurring within the last 10 years: No If all of the above answers are "NO", then may proceed with Cephalosporin use.

## 2017-11-19 HISTORY — PX: COLONOSCOPY: SHX174

## 2017-12-04 ENCOUNTER — Ambulatory Visit: Payer: Medicare Other | Admitting: Gastroenterology

## 2017-12-13 ENCOUNTER — Telehealth: Payer: Self-pay | Admitting: Internal Medicine

## 2017-12-13 NOTE — Telephone Encounter (Signed)
Spoke with patient. Advised her to go ahead and take the antibiotics. She verbalized understanding. Nothing else needed at time of call.

## 2017-12-23 ENCOUNTER — Ambulatory Visit (INDEPENDENT_AMBULATORY_CARE_PROVIDER_SITE_OTHER): Payer: Medicare Other | Admitting: Acute Care

## 2017-12-23 ENCOUNTER — Encounter: Payer: Self-pay | Admitting: Acute Care

## 2017-12-23 DIAGNOSIS — J40 Bronchitis, not specified as acute or chronic: Secondary | ICD-10-CM | POA: Diagnosis not present

## 2017-12-23 NOTE — Assessment & Plan Note (Signed)
Resolved Flare Plan: Continue using your Symbicort 2 puffs twice daily  Rinse mouth after use. Quit smoking Please send note to return to work. Follow up with Dr. Maple HudsonYoung Apr 10, 2018 at 9:30 am. Please contact office for sooner follow up if symptoms do not improve or worsen or seek emergency care

## 2017-12-23 NOTE — Progress Notes (Signed)
History of Present Illness Stephanie Stephens is a 52 y.o. female former smoker with Sarcoid, allergic rhinitis, allergic conjunctivitis angioedema,, complicated by GERD. She is followed by Dr. Maple HudsonYoung.   12/23/2017 Follow up OV: Pt called the office 12/13/2017 with sweats, nasal congestion and chest aching. She was prescribed Doxycycline. He is here for follow up. She states she is doing better. She states her secretions are clear. She states her nasal congestion is better. She is using her [employer] Pot, and benadryl as needed.She is compliant with her Symbicort and Pepcid daily.She denies fever, chest pain, orthopnea or hemoptysis.  Test Results:  CBC Latest Ref Rng & Units 11/14/2017 05/25/2016 02/08/2016  WBC 4.0 - 10.5 K/uL 7.2 8.0 6.7  Hemoglobin 12.0 - 15.0 g/dL 45.413.5 09.813.4 11.912.8  Hematocrit 36.0 - 46.0 % 41.4 40.8 39.1  Platelets 150.0 - 400.0 K/uL 297.0 289 280.0    BMP Latest Ref Rng & Units 05/25/2016 06/29/2014 10/06/2013  Glucose 65 - 99 mg/dL 90 14(N58(L) 94  BUN 6 - 20 mg/dL 9 9 13   Creatinine 0.44 - 1.00 mg/dL 8.290.91 0.9 0.9  Sodium 562135 - 145 mmol/L 140 139 137  Potassium 3.5 - 5.1 mmol/L 3.8 3.7 4.1  Chloride 101 - 111 mmol/L 107 104 104  CO2 22 - 32 mmol/L 27 25 28   Calcium 8.9 - 10.3 mg/dL 13.010.0 86.510.0 78.410.0      Past medical hx Past Medical History:  Diagnosis Date  . Anemia   . Asthma   . Colon polyps   . Depression   . Diverticulosis   . Genital herpes   . Hx of adenomatous colonic polyps   . Sarcoidosis   . Umbilical hernia      Social History   Tobacco Use  . Smoking status: Current Some Day Smoker    Packs/day: 0.50    Types: Cigarettes  . Smokeless tobacco: Never Used  . Tobacco comment: occassional smoker,does not smoke everyday  Substance Use Topics  . Alcohol use: Yes    Alcohol/week: 0.0 oz    Comment: daily the last month  . Drug use: Yes    Types: Marijuana    Comment: last used yesterday    Ms.Aundria RudRogers reports that she has been smoking cigarettes.   She has been smoking about 0.50 packs per day. she has never used smokeless tobacco. She reports that she drinks alcohol. She reports that she uses drugs. Drug: Marijuana.  Tobacco Cessation: Current Every Day smoker  Past surgical hx, Family hx, Social hx all reviewed.  Current Outpatient Medications on File Prior to Visit  Medication Sig  . albuterol (PROVENTIL) (2.5 MG/3ML) 0.083% nebulizer solution USE 1 VIAL IN NEBULIZER 4 TIMES DAILY. Generic: VENTOLIN  . budesonide-formoterol (SYMBICORT) 160-4.5 MCG/ACT inhaler inhale 2 puffs by mouth FIRST THING IN THE MORNING then 2 puffs 12 HOURS LATER  . diphenhydrAMINE (BENADRYL) 25 MG tablet Take 50 mg by mouth every 6 (six) hours as needed for itching or allergies.  Marland Kitchen. docusate sodium (COLACE) 100 MG capsule Take 1 capsule (100 mg total) by mouth every 12 (twelve) hours.  . Multiple Vitamin (MULTIVITAMIN WITH MINERALS) TABS Take 1 tablet by mouth daily.  . valACYclovir (VALTREX) 500 MG tablet Take 500 mg by mouth 2 (two) times daily.  Marland Kitchen. albuterol (PROVENTIL HFA;VENTOLIN HFA) 108 (90 BASE) MCG/ACT inhaler Inhale 2 puffs into the lungs every 4 (four) hours as needed. Asthma   No current facility-administered medications on file prior to visit.  Allergies  Allergen Reactions  . Ciprofloxacin Swelling  . Latex Rash  . Penicillins Rash    Has patient had a PCN reaction causing immediate rash, facial/tongue/throat swelling, SOB or lightheadedness with hypotension: No Has patient had a PCN reaction causing severe rash involving mucus membranes or skin necrosis: No Has patient had a PCN reaction that required hospitalization No Has patient had a PCN reaction occurring within the last 10 years: No If all of the above answers are "NO", then may proceed with Cephalosporin use.    Review Of Systems:  Constitutional:   No  weight loss, night sweats,  Fevers, chills, fatigue, or  lassitude.  HEENT:   No headaches,  Difficulty swallowing,   Tooth/dental problems, or  Sore throat,                No sneezing, itching, ear ache, nasal congestion, post nasal drip,   CV:  No chest pain,  Orthopnea, PND, swelling in lower extremities, anasarca, dizziness, palpitations, syncope.   GI  No heartburn, indigestion, abdominal pain, nausea, vomiting, diarrhea, change in bowel habits, loss of appetite, bloody stools.   Resp: No shortness of breath with exertion or at rest.  No excess mucus, no productive cough,  No non-productive cough,  No coughing up of blood.  No change in color of mucus.  No wheezing.  No chest wall deformity  Skin: no rash or lesions.  GU: no dysuria, change in color of urine, no urgency or frequency.  No flank pain, no hematuria   MS:  No joint pain or swelling.  No decreased range of motion.  No back pain.  Psych:  No change in mood or affect. No depression or anxiety.  No memory loss.   Vital Signs BP 128/78 (BP Location: Right Arm, Cuff Size: Normal)   Pulse 81   Ht 5\' 4"  (1.626 m)   Wt 147 lb 12.8 oz (67 kg)   SpO2 100%   BMI 25.37 kg/m    Physical Exam:  General- No distress,  A&Ox3, pleasant  ENT: No sinus tenderness, TM clear, pale nasal mucosa, no oral exudate,no post nasal drip, no LAN Cardiac: S1, S2, regular rate and rhythm, no murmur Chest: No wheeze/ rales/ dullness; no accessory muscle use, no nasal flaring, no sternal retractions, diminished per bases Abd.: Soft Non-tender, non-distended, BS + Ext: No clubbing cyanosis, edema Neuro:  normal strength Skin: No rashes, warm and dry Psych: normal mood and behavior   Assessment/Plan  BRONCHITIS Resolved Flare Plan: Continue using your Symbicort 2 puffs twice daily  Rinse mouth after use. Quit smoking Please send note to return to work. Follow up with Dr. Maple Hudson Apr 10, 2018 at 9:30 am. Please contact office for sooner follow up if symptoms do not improve or worsen or seek emergency care      Bevelyn Ngo, NP 12/23/2017  11:00  AM

## 2017-12-23 NOTE — Patient Instructions (Addendum)
It is good to see you today. We are glad you feel better.  Continue using your Symbicort 2 puffs twice daily  Rinse mouth after use. Quit smoking Please send note to return to work. Follow up with Dr. Maple HudsonYoung Apr 10, 2018 at 9:30 am. Please contact office for sooner follow up if symptoms do not improve or worsen or seek emergency care

## 2018-01-16 ENCOUNTER — Ambulatory Visit: Payer: Medicare Other | Admitting: Gastroenterology

## 2018-01-16 ENCOUNTER — Encounter: Payer: Self-pay | Admitting: Gastroenterology

## 2018-03-11 ENCOUNTER — Ambulatory Visit (INDEPENDENT_AMBULATORY_CARE_PROVIDER_SITE_OTHER): Payer: Medicare Other | Admitting: Gastroenterology

## 2018-03-11 ENCOUNTER — Encounter: Payer: Self-pay | Admitting: Gastroenterology

## 2018-03-11 VITALS — BP 118/76 | HR 80 | Ht 64.0 in | Wt 146.8 lb

## 2018-03-11 DIAGNOSIS — Z800 Family history of malignant neoplasm of digestive organs: Secondary | ICD-10-CM

## 2018-03-11 DIAGNOSIS — K219 Gastro-esophageal reflux disease without esophagitis: Secondary | ICD-10-CM | POA: Diagnosis not present

## 2018-03-11 DIAGNOSIS — K589 Irritable bowel syndrome without diarrhea: Secondary | ICD-10-CM

## 2018-03-11 DIAGNOSIS — K625 Hemorrhage of anus and rectum: Secondary | ICD-10-CM

## 2018-03-11 MED ORDER — DICYCLOMINE HCL 10 MG PO CAPS: 10.0000 mg | ORAL_CAPSULE | Freq: Three times a day (TID) | ORAL | 1 refills | Status: DC | PRN

## 2018-03-11 MED ORDER — SUPREP BOWEL PREP KIT 17.5-3.13-1.6 GM/177ML PO SOLN: ORAL | 0 refills | Status: DC

## 2018-03-11 MED ORDER — METHYLCELLULOSE (LAXATIVE) PO POWD: ORAL | Status: DC

## 2018-03-11 MED ORDER — OMEPRAZOLE 40 MG PO CPDR
40.0000 mg | DELAYED_RELEASE_CAPSULE | Freq: Every day | ORAL | 3 refills | Status: DC
Start: 2018-03-11 — End: 2020-09-15

## 2018-03-11 NOTE — Progress Notes (Signed)
HPI :  52 y/o female with a history of sarcoidosis, colon polyps, FH of colon cancer, referred here by Dr. Fleet ContrasEdwin Avbuere for multiple bowel symptoms and colon cancer screening.   She reports her father was diagnosed with colon cancer, she thinks this was diagnosed at less than age 51, he passed away from colon cancer. Her last colonoscopy was done in 2014 by [facility]Burbridge [facility]. She had prolapse-type polyp, no adenomas on that exam.  She reports long-standing bowel changes to include alternation of constipated and then loose stools. Sometimes can go a few days to a week or 2 being constipated, and then she will have loose stools for the same amount of time. She reports symptoms ongoing for a while. She does have occasional red blood in her stool. She is unclear how frequent this occurs as she does not routinely look for this.She does have occasional abdominal pains in her mid lower abdomen. Sometimes they're postprandial, sometimes she has some urgency with her stools after eating. She thinks her discomfort is usually relieved with having a bowel movement. She has used stool softener in the past which is helped a little bit.  She also endorses pyrosis that has been occurring, sometimes bothers her at night. She is also endorsed a very bad acid taste in her mouth, she states she brings toothbrush and toothpaste with her to clean her mouth frequently. She has tried using mouthwashes which has not helped. She denies much Frank regurgitation. She denies dysphagia. Her appetite can vary at times, but she has no vomiting. She has used some Tums occasionally but has not tried PPI.   Colonoscopy 06/24/2013 - [facility], Dr. Shana ChuteBurbridge - diverticulosis throughout, 3-4 rectal polyp c/w prolapse polyp, hemorrhoids Colonoscopy 03/27/2006 - diverticulosis  Past Medical History:  Diagnosis Date  . Anemia   . Asthma   . Colon polyps   . Depression   . Diverticulosis   . Genital herpes   . Hx of adenomatous colonic  polyps   . Pancreatitis   . Sarcoidosis   . Umbilical hernia      Past Surgical History:  Procedure Laterality Date  . CESAREAN SECTION     x 3  . CHOLECYSTECTOMY     lap  . HERNIA REPAIR     x 4  . PARTIAL HYSTERECTOMY    . TONSILLECTOMY     Family History  Problem Relation Age of Onset  . Ovarian cancer Mother   . Colon polyps Mother   . Breast cancer Sister   . Colon cancer Father    Social History   Tobacco Use  . Smoking status: Current Some Day Smoker    Packs/day: 0.50    Types: Cigarettes  . Smokeless tobacco: Never Used  . Tobacco comment: occassional smoker,does not smoke everyday  Substance Use Topics  . Alcohol use: Yes    Alcohol/week: 0.0 oz    Comment: daily the last month  . Drug use: Yes    Types: Marijuana    Comment: last used yesterday   Current Outpatient Medications  Medication Sig Dispense Refill  . albuterol (PROVENTIL HFA;VENTOLIN HFA) 108 (90 BASE) MCG/ACT inhaler Inhale 2 puffs into the lungs every 4 (four) hours as needed. Asthma 1 Inhaler 2  . albuterol (PROVENTIL) (2.5 MG/3ML) 0.083% nebulizer solution USE 1 VIAL IN NEBULIZER 4 TIMES DAILY. Generic: VENTOLIN 120 vial 10  . budesonide-formoterol (SYMBICORT) 160-4.5 MCG/ACT inhaler inhale 2 puffs by mouth FIRST THING IN THE MORNING then 2  puffs 12 HOURS LATER 10.2 Inhaler 6  . diphenhydrAMINE (BENADRYL) 25 MG tablet Take 50 mg by mouth every 6 (six) hours as needed for itching or allergies.    Marland Kitchen docusate sodium (COLACE) 100 MG capsule Take 1 capsule (100 mg total) by mouth every 12 (twelve) hours. 60 capsule 0  . Multiple Vitamin (MULTIVITAMIN WITH MINERALS) TABS Take 1 tablet by mouth daily.    . valACYclovir (VALTREX) 500 MG tablet Take 500 mg by mouth 2 (two) times daily.     No current facility-administered medications for this visit.    Allergies  Allergen Reactions  . Ciprofloxacin Swelling  . Latex Rash  . Penicillins Rash    Has patient had a PCN reaction causing immediate  rash, facial/tongue/throat swelling, SOB or lightheadedness with hypotension: No Has patient had a PCN reaction causing severe rash involving mucus membranes or skin necrosis: No Has patient had a PCN reaction that required hospitalization No Has patient had a PCN reaction occurring within the last 10 years: No If all of the above answers are "NO", then may proceed with Cephalosporin use.     Review of Systems: All systems reviewed and negative except where noted in HPI.   Labs reviewed from Dr. Allena Katz office from 12/24/2016  LFTs normal BUN 7, Cr 0.88  Lab Results  Component Value Date   WBC 7.2 11/14/2017   HGB 13.5 11/14/2017   HCT 41.4 11/14/2017   MCV 96.0 11/14/2017   PLT 297.0 11/14/2017    Physical Exam: BP 118/76   Pulse 80   Ht 5\' 4"  (1.626 m)   Wt 146 lb 12.8 oz (66.6 kg)   BMI 25.20 kg/m  Constitutional: Pleasant,well-developed, female in no acute distress. HEENT: Normocephalic and atraumatic. Conjunctivae are normal. No scleral icterus. Neck supple.  Cardiovascular: Normal rate, regular rhythm.  Pulmonary/chest: Effort normal and breath sounds normal. No wheezing, rales or rhonchi. Abdominal: Soft, nondistended, nontender. . There are no masses palpable. No hepatomegaly. Extremities: no edema Lymphadenopathy: No cervical adenopathy noted. Neurological: Alert and oriented to person place and time. Skin: Skin is warm and dry. No rashes noted. Psychiatric: Normal mood and affect. Behavior is normal.   ASSESSMENT AND PLAN: 52 year old female here for new patient assessment of the following issues:  IBS / rectal bleeding / family history of colon cancer - based on history of symptoms I suspect she may have irritable bowel syndrome.She is noted to have hemorrhoids on her last colonoscopy which are likely the source of bleeding, her Hgb has been stable over time. She is due for colonoscopy this year given her, and with family history of colon cancer and ongoing  symptoms as outlined, recommend optical colonoscopy to perform her screening. I discussed risks and benefits of colonoscopy and anesthesia with her and she wanted to proceed. In the interim regarding her bowel changes, recommend a low FODMAP diet to see if this helps. I will also give her Bentyl 10 mg every 8 hours as needed for cramps. She can also try using Citrucel once daily to regularize her bowel habits low FODMAP diet doesn't help. She agreed with the plan.  GERD - I suspect this is causing the symptoms she is reporting as above. I discussed options with her and recommended a trial of omeprazole 40 mg once daily for 30 days. If this works for her we will try decreasing the dose and she can use it as needed moving forward.  Ileene Patrick, MD [facility]  CC: Fleet Contras,  MD

## 2018-03-11 NOTE — Patient Instructions (Addendum)
If you are age 51 or older, your body mass index should be between 23-30. Your Body mass index is 25.2 kg/m. If this is out of the aforementioned range listed, please consider follow up with your Primary Care Provider.  If you are age 51 or younger, your body mass index should be between 19-25. Your Body mass index is 25.2 kg/m. If this is out of the aformentioned range listed, please consider follow up with your Primary Care Provider.   You have been scheduled for a colonoscopy. Please follow written instructions given to you at your visit today.  Please pick up your prep supplies at the pharmacy within the next 1-3 days. If you use inhalers (even only as needed), please bring them with you on the day of your procedure. Your physician has requested that you go to www.startemmi.com and enter the access code given to you at your visit today. This web site gives a general overview about your procedure. However, you should still follow specific instructions given to you by our office regarding your preparation for the procedure.  We have sent the following medications to your pharmacy for you to pick up at your convenience: Bentyl 10mg  Omeprazole 40mg   Please purchase the following medications over the counter and take as directed: Citrucel: Use daily  We are giving you a low FOD-MAP diet to follow.  Thank you for entrusting me with your care and for choosing [facility], Dr. Ileene PatrickSteven Armbruster

## 2018-03-18 ENCOUNTER — Encounter: Payer: Medicare Other | Admitting: Gastroenterology

## 2018-03-26 ENCOUNTER — Ambulatory Visit (AMBULATORY_SURGERY_CENTER): Payer: Medicare Other | Admitting: Gastroenterology

## 2018-03-26 ENCOUNTER — Other Ambulatory Visit: Payer: Self-pay

## 2018-03-26 ENCOUNTER — Encounter: Payer: Self-pay | Admitting: Gastroenterology

## 2018-03-26 VITALS — BP 137/80 | HR 76 | Temp 96.6°F | Resp 13 | Ht 64.0 in | Wt 146.0 lb

## 2018-03-26 DIAGNOSIS — K625 Hemorrhage of anus and rectum: Secondary | ICD-10-CM

## 2018-03-26 DIAGNOSIS — K589 Irritable bowel syndrome without diarrhea: Secondary | ICD-10-CM

## 2018-03-26 DIAGNOSIS — R194 Change in bowel habit: Secondary | ICD-10-CM

## 2018-03-26 DIAGNOSIS — Z800 Family history of malignant neoplasm of digestive organs: Secondary | ICD-10-CM | POA: Diagnosis not present

## 2018-03-26 MED ORDER — SODIUM CHLORIDE 0.9 % IV SOLN: 500.0000 mL | Freq: Once | INTRAVENOUS | Status: DC

## 2018-03-26 NOTE — Progress Notes (Signed)
Pt's states no medical or surgical changes since previsit or office visit. 

## 2018-03-26 NOTE — Patient Instructions (Addendum)
THANK YOU FOR ALLOWING [place] TO CARE FOR YOU TODAY!  Handouts given for Diverticulosis and Hemorrhoids.      YOU HAD AN ENDOSCOPIC PROCEDURE TODAY AT THE [facility]:   Refer to the procedure report that was given to you for any specific questions about what was found during the examination.  If the procedure report does not answer your questions, please call your gastroenterologist to clarify.  If you requested that your care partner not be given the details of your procedure findings, then the procedure report has been included in a sealed envelope for you to review at your convenience later.  YOU SHOULD EXPECT: Some feelings of bloating in the abdomen. Passage of more gas than usual.  Walking can help get rid of the air that was put into your GI tract during the procedure and reduce the bloating. If you had a lower endoscopy (such as a colonoscopy or flexible sigmoidoscopy) you may notice spotting of blood in your stool or on the toilet paper. If you underwent a bowel prep for your procedure, you may not have a normal bowel movement for a few days.  Please Note:  You might notice some irritation and congestion in your nose or some drainage.  This is from the oxygen used during your procedure.  There is no need for concern and it should clear up in a day or so.  SYMPTOMS TO REPORT IMMEDIATELY:   Following lower endoscopy (colonoscopy or flexible sigmoidoscopy):  Excessive amounts of blood in the stool  Significant tenderness or worsening of abdominal pains  Swelling of the abdomen that is new, acute  Fever of 100F or higher    For urgent or emergent issues, a gastroenterologist can be reached at any hour by calling [phone].   DIET:  We do recommend a small meal at first, but then you may proceed to your regular diet.  Drink plenty of fluids but you should avoid alcoholic beverages for 24 hours.  ACTIVITY:  You should plan to take it easy for the rest of today and you  should NOT DRIVE or use heavy machinery until tomorrow (because of the sedation medicines used during the test).    FOLLOW UP: Our staff will call the number listed on your records the next business day following your procedure to check on you and address any questions or concerns that you may have regarding the information given to you following your procedure. If we do not reach you, we will leave a message.  However, if you are feeling well and you are not experiencing any problems, there is no need to return our call.  We will assume that you have returned to your regular daily activities without incident.  If any biopsies were taken you will be contacted by phone or by letter within the next 1-3 weeks.  Please call us at [phone] if you have not heard about the biopsies in 3 weeks.    SIGNATURES/CONFIDENTIALITY: You and/or your care partner have signed paperwork which will be entered into your electronic medical record.  These signatures attest to the fact that that the information above on your After Visit Summary has been reviewed and is understood.  Full responsibility of the confidentiality of this discharge information lies with you and/or your care-partner.

## 2018-03-26 NOTE — Progress Notes (Signed)
To PACU, VSS. Report to [email or web address] 

## 2018-03-26 NOTE — Op Note (Signed)
[facility] Patient Name: Stephanie Stephens Procedure Date: 03/26/2018 8:25 AM MRN: [id number] Endoscopist: Viviann Spare P. Armbruster MD, MD Age: 52 Referring MD:  Date of Birth: 29-Jul-1966 Gender: Female Account #: [id number] Procedure:                Colonoscopy Indications:              Screening in patient at increased risk: Family                            history of 1st-degree relative in father before age                            63 years, change in bowel habits, rectal bleeding Medicines:                Monitored Anesthesia Care Procedure:                Pre-Anesthesia Assessment:                           - Prior to the procedure, a History and Physical                            was performed, and patient medications and                            allergies were reviewed. The patient's tolerance of                            previous anesthesia was also reviewed. The risks                            and benefits of the procedure and the sedation                            options and risks were discussed with the patient.                            All questions were answered, and informed consent                            was obtained. Prior Anticoagulants: The patient has                            taken no previous anticoagulant or antiplatelet                            agents. ASA Grade Assessment: II - A patient with                            mild systemic disease. After reviewing the risks                            and benefits, the patient was deemed in  satisfactory condition to undergo the procedure.                           After obtaining informed consent, the colonoscope                            was passed under direct vision. Throughout the                            procedure, the patient's blood pressure, pulse, and                            oxygen saturations were monitored continuously. The   Colonoscope was introduced through the anus and                            advanced to the the terminal ileum, with                            identification of the appendiceal orifice and IC                            valve. The colonoscopy was performed without                            difficulty. The patient tolerated the procedure                            well. The quality of the bowel preparation was good. Scope In: 8:51:41 AM Scope Out: 9:04:05 AM Scope Withdrawal Time: 0 hours 9 minutes 51 seconds  Total Procedure Duration: 0 hours 12 minutes 24 seconds  Findings:                 The perianal and digital rectal examinations were                            normal.                           The terminal ileum appeared normal.                           Multiple medium-mouthed diverticula were found in                            the entire colon.                           Internal hemorrhoids were found during retroflexion.                           The exam was otherwise without abnormality. No                            polyps or inflammation. Complications:            No immediate  complications. Estimated blood loss:                            None. Estimated Blood Loss:     Estimated blood loss: none. Impression:               - The examined portion of the ileum was normal.                           - Diverticulosis in the entire examined colon.                           - Internal hemorrhoids.                           - The examination was otherwise normal.                           - No polyps or inflammation. Recommendation:           - Patient has a contact number available for                            emergencies. The signs and symptoms of potential                            delayed complications were discussed with the                            patient. Return to normal activities tomorrow.                            Written discharge instructions were provided to  the                            patient.                           - Resume previous diet.                           - Continue present medications.                           - Repeat colonoscopy in 5 years for screening                            purposes.                           - Consideration for hemorrhoid banding for                            treatment of hemorrhoids Steven P. Armbruster MD, MD 03/26/2018 9:09:25 AM This report has been signed electronically.

## 2018-03-27 ENCOUNTER — Telehealth: Payer: Self-pay

## 2018-03-27 NOTE — Telephone Encounter (Signed)
  Follow up Call-  Call back number 03/26/2018  Post procedure Call Back phone  # [phone]  Permission to leave phone message Yes  Some recent data might be hidden     Patient questions:  Do you have a fever, pain , or abdominal swelling? No. Pain Score  0 *  Have you tolerated food without any problems? Yes.    Have you been able to return to your normal activities? Yes.    Do you have any questions about your discharge instructions: Diet   No. Medications  No. Follow up visit  No.  Do you have questions or concerns about your Care? No.  Actions: * If pain score is 4 or above: No action needed, pain <4.

## 2018-04-09 ENCOUNTER — Telehealth: Payer: Self-pay | Admitting: Internal Medicine

## 2018-04-09 NOTE — Telephone Encounter (Signed)
Patient returned call, CB is [phone]

## 2018-04-09 NOTE — Telephone Encounter (Signed)
Called Patient back about TB skin test.  Patient has injection appt at 0945 04/16/18 for TB test.  Nothing further needed at this time.

## 2018-04-09 NOTE — Telephone Encounter (Signed)
I have left a detailed message with the pt per her request. Advised her that we couldn't take care of this at her appointment tomorrow due to the test having to be read 48 hours after the test is placed. Nothing further was needed.

## 2018-04-09 NOTE — Telephone Encounter (Signed)
lmtcb x 1 for pt. Please see earlier telephone encounter dated as 04/08/18

## 2018-04-10 ENCOUNTER — Ambulatory Visit (INDEPENDENT_AMBULATORY_CARE_PROVIDER_SITE_OTHER): Payer: Medicare Other | Admitting: Internal Medicine

## 2018-04-10 ENCOUNTER — Encounter: Payer: Self-pay | Admitting: Internal Medicine

## 2018-04-10 VITALS — BP 114/64 | HR 57 | Ht 64.0 in | Wt 143.6 lb

## 2018-04-10 DIAGNOSIS — J4530 Mild persistent asthma, uncomplicated: Secondary | ICD-10-CM | POA: Diagnosis not present

## 2018-04-10 DIAGNOSIS — D869 Sarcoidosis, unspecified: Secondary | ICD-10-CM | POA: Diagnosis not present

## 2018-04-10 DIAGNOSIS — J40 Bronchitis, not specified as acute or chronic: Secondary | ICD-10-CM

## 2018-04-10 MED ORDER — ALBUTEROL SULFATE HFA 108 (90 BASE) MCG/ACT IN AERS: 2.0000 | INHALATION_SPRAY | RESPIRATORY_TRACT | 12 refills | Status: DC | PRN

## 2018-04-10 MED ORDER — BUDESONIDE-FORMOTEROL FUMARATE 160-4.5 MCG/ACT IN AERO: INHALATION_SPRAY | RESPIRATORY_TRACT | 12 refills | Status: DC

## 2018-04-10 MED ORDER — ALBUTEROL SULFATE (2.5 MG/3ML) 0.083% IN NEBU
INHALATION_SOLUTION | RESPIRATORY_TRACT | 12 refills | Status: DC
Start: 2018-04-10 — End: 2019-09-29

## 2018-04-10 NOTE — Assessment & Plan Note (Signed)
No obvious clinical indication that sarcoid has reactivated.

## 2018-04-10 NOTE — Progress Notes (Signed)
HPI   Former smoker followed for Sarcoid, allergic rhinitis, allergic conjunctivitis angioedema,, complicated by GERD, cervical node,  ACE 05/2013- 56H Allergy profile 01/20/15- Neg, IgE 22 ACE level 01/20/15- stable at 53 ---------------------------------------------------------------------- 04/10/17- 49 yoF  Former smoker followed for Sarcoid, allergic rhinitis, allergic conjunctivitis, angioedema, complicated by GERD, cervical node, ----FOLLOW UP FOR 1 year follow up patient has found that she is allergy it fruit.  Had angioedema lip after eating mango and was given EpiPen. No problem with other fruits. She chooses not to eat meat. Otherwise no concerns. Denies routine cough, fever/sweat, adenopathy, rash. Asks referral for mammogram- routine, no concern  04/10/2018- 36 yoF  Former smoker followed for Sarcoid, allergic rhinitis, allergic conjunctivitis, angioedema, complicated by GERD, cervical node, ----Sarcoidosis: Pt is having trouble with flares up-? if ACE level is high. Trouble with breathing, vision, and teeth. Pt would like to have referral to PCP[facility]-Dr. Wynelle Link.  Symbicort 160, neb albuterol, albuterol hfa,  She complains about her eyesight, saying an optometrist had given her eyedrops for allergy and her eyesight has not been as good since.  The same person fitted her with eyeglasses that worked very well until she dropped and broke the frame.  Wants to see somebody else. Stable small retention cyst left scalp unchanged.  Otherwise no active rash.  Not aware of adenopathy. Persistent nasal stuffiness-using Nettie pot. Chest feels comfortable, admits occasional minor wheeze. Denies night sweats or fever. CXR 11/14/2017 Stable changes related to known sarcoidosis. No active cardiopulmonary disease.  ROS-see HPI   + = positive Constitutional:    weight loss, night sweats, fevers, chills, fatigue, lassitude. HEENT:    headaches, difficulty swallowing, tooth/dental problems, sore  throat,       sneezing, itching, ear ache, + nasal congestion, post nasal drip, snoring CV:    chest pain, orthopnea, PND, swelling in lower extremities, anasarca,                                                     dizziness, palpitations Resp:   shortness of breath with exertion or at rest.                 productive cough,   non-productive cough, coughing up of blood.              change in color of mucus.  wheezing.   Skin:    rash or lesions. GI:  No-   heartburn, indigestion, abdominal pain, nausea, vomiting, diarrhea,                 change in bowel habits, loss of appetite GU: dysuria, change in color of urine, no urgency or frequency.   flank pain. MS:   joint pain, stiffness, decreased range of motion, back pain. Neuro-     nothing unusual Psych:  change in mood or affect.  depression or anxiety.   memory loss.  OBJ- Physical Exam General- Alert, Oriented, Affect-appropriate, Distress- none acute Skin- rash-none, lesions- none, excoriation- none Lymphadenopathy- none Head- atraumatic            Eyes- Gross vision intact, PERRLA, conjunctivae and secretions clear            Ears- Hearing, canals-normal            Nose-  turbinate edema, no-Septal dev, mucus, polyps,  erosion, perforation             Throat- Mallampati II , mucosa clear , drainage- none, tonsils- atrophic Neck- flexible , trachea midline, no stridor , thyroid nl, carotid no bruit Chest - symmetrical excursion , unlabored           Heart/CV- RRR , no murmur , no gallop  , no rub, nl s1 s2                           - JVD- none , edema- none, stasis changes- none, varices- none           Lung- clear to P&A, wheeze- none, cough- none , dullness-none, rub- none           Chest wall- breast without dominant mass or discharge Abd-  Br/ Gen/ Rectal- Not done, not indicated Extrem- cyanosis- none, clubbing, none, atrophy- none, strength- nl Neuro- grossly intact to observation

## 2018-04-10 NOTE — Assessment & Plan Note (Signed)
Very well controlled currently with no recent exacerbation.  Denies need for frequent use of rescue inhaler, or associated sleep disturbance. Plan-refilled bronchodilator scripts.

## 2018-04-10 NOTE — Patient Instructions (Signed)
Scripts sent refilling breathing meds  Order- referral to Dr [facility] to establish,  Dx sarcoid  Order- referral to [facility], Dr Wynelle Link,  To establish fo primary care  Please call if we can help

## 2018-04-15 ENCOUNTER — Ambulatory Visit (INDEPENDENT_AMBULATORY_CARE_PROVIDER_SITE_OTHER): Payer: Medicare Other

## 2018-04-15 ENCOUNTER — Telehealth: Payer: Self-pay | Admitting: Internal Medicine

## 2018-04-15 DIAGNOSIS — Z111 Encounter for screening for respiratory tuberculosis: Secondary | ICD-10-CM

## 2018-04-15 NOTE — Telephone Encounter (Signed)
TB skin test administered today- see today's injection visit.

## 2018-04-16 ENCOUNTER — Ambulatory Visit: Payer: Medicare Other

## 2018-04-17 ENCOUNTER — Telehealth: Payer: Self-pay | Admitting: Internal Medicine

## 2018-04-17 ENCOUNTER — Encounter: Payer: Self-pay | Admitting: *Deleted

## 2018-04-17 LAB — TB SKIN TEST
Induration: 0 mm
TB SKIN TEST: NEGATIVE

## 2018-04-17 NOTE — Telephone Encounter (Signed)
Pt here to have PPD read-negative and noted in Epic. Letter printed for patient with results. Nothing more needed at this time.

## 2018-06-09 ENCOUNTER — Other Ambulatory Visit: Payer: Self-pay | Admitting: Internal Medicine

## 2018-06-09 DIAGNOSIS — Z1231 Encounter for screening mammogram for malignant neoplasm of breast: Secondary | ICD-10-CM

## 2018-07-02 ENCOUNTER — Ambulatory Visit: Payer: Medicare Other

## 2018-07-11 ENCOUNTER — Emergency Department (HOSPITAL_COMMUNITY)
Admission: EM | Admit: 2018-07-11 | Discharge: 2018-07-11 | Disposition: A | Discharge status: Home / Self Care | Payer: Medicare Other | Attending: Emergency Medicine | Admitting: Emergency Medicine

## 2018-07-11 DIAGNOSIS — J45909 Unspecified asthma, uncomplicated: Secondary | ICD-10-CM | POA: Diagnosis not present

## 2018-07-11 DIAGNOSIS — R220 Localized swelling, mass and lump, head: Secondary | ICD-10-CM

## 2018-07-11 DIAGNOSIS — Z87891 Personal history of nicotine dependence: Secondary | ICD-10-CM | POA: Insufficient documentation

## 2018-07-11 DIAGNOSIS — Z79899 Other long term (current) drug therapy: Secondary | ICD-10-CM | POA: Diagnosis not present

## 2018-07-11 DIAGNOSIS — R51 Headache: Secondary | ICD-10-CM | POA: Diagnosis present

## 2018-07-11 DIAGNOSIS — Z91040 Latex allergy status: Secondary | ICD-10-CM | POA: Diagnosis not present

## 2018-07-11 LAB — BASIC METABOLIC PANEL
Anion gap: 7 (ref 5–15)
BUN: 11 mg/dL (ref 6–20)
CALCIUM: 9.9 mg/dL (ref 8.9–10.3)
CO2: 28 mmol/L (ref 22–32)
CREATININE: 0.96 mg/dL (ref 0.44–1.00)
Chloride: 108 mmol/L (ref 98–111)
GFR calc Af Amer: >60 mL/min (ref >60)
Glucose, Bld: 89 mg/dL (ref 70–99)
POTASSIUM: 3.6 mmol/L (ref 3.5–5.1)
SODIUM: 143 mmol/L (ref 135–145)

## 2018-07-11 LAB — CBC WITH DIFFERENTIAL/PLATELET
Basophils Absolute: 0 10*3/uL (ref 0.0–0.1)
Basophils Relative: 0 %
EOS ABS: 0.1 10*3/uL (ref 0.0–0.7)
EOS PCT: 2 %
HCT: 39.4 % (ref 36.0–46.0)
Hemoglobin: 12.6 g/dL (ref 12.0–15.0)
Lymphocytes Relative: 50 %
Lymphs Abs: 3.1 10*3/uL (ref 0.7–4.0)
MCH: 30.7 pg (ref 26.0–34.0)
MCHC: 32 g/dL (ref 30.0–36.0)
MCV: 96.1 fL (ref 78.0–100.0)
MONO ABS: 0.4 10*3/uL (ref 0.1–1.0)
Monocytes Relative: 6 %
Neutro Abs: 2.6 10*3/uL (ref 1.7–7.7)
Neutrophils Relative %: 42 %
PLATELETS: 392 10*3/uL (ref 150–400)
RBC: 4.1 MIL/uL (ref 3.87–5.11)
RDW: 13.1 % (ref 11.5–15.5)
WBC: 6.2 10*3/uL (ref 4.0–10.5)

## 2018-07-11 MED ORDER — DOXYCYCLINE HYCLATE 100 MG PO CAPS: 100.0000 mg | ORAL_CAPSULE | Freq: Two times a day (BID) | ORAL | 0 refills | Status: DC

## 2018-07-11 NOTE — ED Provider Notes (Signed)
[facility]-EMERGENCY DEPT Provider Note   CSN: [id number] Arrival date & time: 07/11/18  1327     History   Chief Complaint Chief Complaint  Patient presents with  . Headache    HPI Stephanie Stephens is a 52 y.o. female here for evaluation of scalp swelling onset Monday morning.  Patient woke up and noticed swelling with associated tenderness, warmth to the posterior right scalp.  Aggravated by direct palpation.  She has taken Advil and Tylenol as well as applied hot compresses which helped with the pain but the size remains unchanged.  Pain is described as a heavy and pulling sensation on her scalp.  She thinks she may have been bit by an insect but denies direct trauma.  She denies fevers, sweats.     HPI  Past Medical History:  Diagnosis Date  . Anemia   . Asthma   . Colon polyps   . Depression   . Diverticulosis   . Genital herpes   . Hx of adenomatous colonic polyps   . Pancreatitis   . Sarcoidosis   . Umbilical hernia     Patient Active Problem List   Diagnosis Date Noted  . Food allergy 04/15/2017  . CAP (community acquired pneumonia) 10/02/2013  . Cigarette smoker in remission 06/27/2013  . GERD 09/04/2010  . PHARYNGITIS, ACUTE 03/15/2009  . BRONCHITIS 08/24/2008  . ALLERGIC CONJUNCTIVITIS 01/27/2008  . PULMONARY SARCOIDOSIS 10/03/2007  . Allergic conjunctivitis and rhinitis 10/03/2007  . Asthma, mild persistent 10/03/2007    Past Surgical History:  Procedure Laterality Date  . CESAREAN SECTION     x 3  . CHOLECYSTECTOMY     lap  . HERNIA REPAIR     x 4  . PARTIAL HYSTERECTOMY    . TONSILLECTOMY       OB History   None      Home Medications    Prior to Admission medications   Medication Sig Start Date End Date Taking? Authorizing Provider  albuterol (PROVENTIL HFA;VENTOLIN HFA) 108 (90 Base) MCG/ACT inhaler Inhale 2 puffs into the lungs every 4 (four) hours as needed. Asthma 04/10/18 03/17/21 Yes Young, Clinton D, MD    albuterol (PROVENTIL) (2.5 MG/3ML) 0.083% nebulizer solution USE 1 VIAL IN NEBULIZER 4 TIMES DAILY. Generic: VENTOLIN 04/10/18  Yes Young, Clinton D, MD  budesonide-formoterol (SYMBICORT) 160-4.5 MCG/ACT inhaler inhale 2 puffs by mouth FIRST THING IN THE MORNING then 2 puffs 12 HOURS LATER 04/10/18  Yes Young, Clinton D, MD  diphenhydrAMINE (BENADRYL) 25 MG tablet Take 25 mg by mouth daily.    Yes [provider]  docusate sodium (COLACE) 100 MG capsule Take 1 capsule (100 mg total) by mouth every 12 (twelve) hours. Patient taking differently: Take 100 mg by mouth daily.  05/26/16  Yes Arby Barrette, MD  Multiple Vitamin (MULTIVITAMIN WITH MINERALS) TABS Take 1 tablet by mouth daily.   Yes [provider]  dicyclomine (BENTYL) 10 MG capsule Take 1 capsule (10 mg total) by mouth every 8 (eight) hours as needed for spasms. Patient not taking: Reported on 07/11/2018 03/11/18   Benancio Deeds, MD  doxycycline (VIBRAMYCIN) 100 MG capsule Take 1 capsule (100 mg total) by mouth 2 (two) times daily. 07/11/18   Liberty Handy, PA-C  methylcellulose (CITRUCEL) oral powder Use daily as directed Patient not taking: Reported on 07/11/2018 03/11/18   Benancio Deeds, MD  omeprazole (PRILOSEC) 40 MG capsule Take 1 capsule (40 mg total) by mouth daily. Patient  not taking: Reported on 07/11/2018 03/11/18   Armbruster, Willaim RayasSteven P, MD    Family History Family History  Problem Relation Age of Onset  . Ovarian cancer Mother   . Colon polyps Mother   . Breast cancer Sister   . Colon cancer Father   . Stomach cancer Neg Hx   . Esophageal cancer Neg Hx     Social History Social History   Tobacco Use  . Smoking status: Former Smoker    Packs/day: 0.00    Types: Cigarettes  . Smokeless tobacco: Never Used  . Tobacco comment: Stopped smoking April 2019   Substance Use Topics  . Alcohol use: Yes    Alcohol/week: 0.0 standard drinks    Comment: daily the last month  . Drug use: Yes     Types: Marijuana    Comment: last used yesterday     Allergies   Bee venom; Mango flavor; Ciprofloxacin; Latex; and Penicillins   Review of Systems Review of Systems  Skin:       Scalp swelling and tenderness   All other systems reviewed and are negative.    Physical Exam Updated Vital Signs BP 117/78 (BP Location: Left Arm)   Pulse 69   Temp 97.9 F (36.6 C) (Oral)   Resp 18   SpO2 100%   Physical Exam  Constitutional: She is oriented to person, place, and time. She appears well-developed and well-nourished.  NAD.  HENT:  Head: Normocephalic and atraumatic.  Right Ear: External ear normal.  Left Ear: External ear normal.  Nose: Nose normal.  4 x 5 cm area of edema, soft fluctuance, warmth and tenderness to right posterior scalp. No signs of abrasions, inset bite, drainage, ecchymosis.  Milder area of edema below this area that is non tender. 1 x 1 cyst like lesion to above left ear, non tender without fluctuance.   Eyes: Conjunctivae and EOM are normal.  Neck: Normal range of motion. Neck supple.  Cardiovascular: Normal rate, regular rhythm and normal heart sounds.  Pulmonary/Chest: Effort normal and breath sounds normal.  Musculoskeletal: Normal range of motion. She exhibits no deformity.  Neurological: She is alert and oriented to person, place, and time.  Skin: Skin is warm and dry. Capillary refill takes less than 2 seconds.  Psychiatric: She has a normal mood and affect. Her behavior is normal. Judgment and thought content normal.  Nursing note and vitals reviewed.    ED Treatments / Results  Labs (all labs ordered are listed, but only abnormal results are displayed) Labs Reviewed  CBC WITH DIFFERENTIAL/PLATELET  BASIC METABOLIC PANEL    EKG None  Radiology No results found.  Procedures Procedures (including critical care time)  Medications Ordered in ED Medications - No data to display   Initial Impression / Assessment and Plan / ED  Course  I have reviewed the triage vital signs and the nursing notes.  Pertinent labs & imaging results that were available during my care of the patient were reviewed by me and considered in my medical decision making (see chart for details).     52 year old with gradually worsening focal edema, fluctuance, warmth and tenderness to the right posterior scalp most consistent with superficial infectious process versus abscess.  No fevers associated.  No trauma to raise suspicion for traumatic hematoma.  I recommended local anesthetic and needle aspiration but patient declined because she is scared of needles.  I explained to patient there is no indication for x-rays or CT scans as I suspect  that this is a superficial process.  Bedside ultrasound limited due to hair. Will opt for doxycycline, NSAIDs, warm compresses.  I recommended she follow-up with PCP in 48 hours for recheck.  Discussed return precautions.  Patient discussed with Dr. Rodena Medin.  Final Clinical Impressions(s) / ED Diagnoses   Final diagnoses:  Superficial swelling of scalp    ED Discharge Orders         Ordered    doxycycline (VIBRAMYCIN) 100 MG capsule  2 times daily     07/11/18 1734           Liberty Handy, [place] 07/11/18 1743    Wynetta Fines, MD 07/14/18 1505

## 2018-07-11 NOTE — ED Triage Notes (Signed)
Pt complains of knot on the back of her head, nausea, dizziness for the past few days. Pt believes she was bitten by a bug on the top of her head.

## 2018-07-11 NOTE — Discharge Instructions (Signed)
You were seen in the ER for scalp swelling and pain.  Based on exam and symptoms I think that this is due to a superficial skin infection possible small abscess.  You declined needle aspiration and requested discharge with antibiotics.  Take ibuprofen and/or acetaminophen for associated pain and swelling.  Apply warm compresses at least 3 times daily.  Take antibiotics as prescribed.  Follow-up with your primary care doctor in 48 to 72 hours for reevaluation.  Return to the ER for worsening swelling, pain, fevers, chills.

## 2018-07-20 ENCOUNTER — Ambulatory Visit (INDEPENDENT_AMBULATORY_CARE_PROVIDER_SITE_OTHER): Payer: Medicare Other

## 2018-07-20 ENCOUNTER — Ambulatory Visit (HOSPITAL_COMMUNITY)
Admission: EM | Admit: 2018-07-20 | Discharge: 2018-07-20 | Disposition: A | Discharge status: Home / Self Care | Payer: Medicare Other | Attending: Family Medicine | Admitting: Family Medicine

## 2018-07-20 ENCOUNTER — Encounter (HOSPITAL_COMMUNITY): Payer: Self-pay | Admitting: Emergency Medicine

## 2018-07-20 DIAGNOSIS — S92425A Nondisplaced fracture of distal phalanx of left great toe, initial encounter for closed fracture: Secondary | ICD-10-CM

## 2018-07-20 DIAGNOSIS — W01198A Fall on same level from slipping, tripping and stumbling with subsequent striking against other object, initial encounter: Secondary | ICD-10-CM | POA: Diagnosis not present

## 2018-07-20 MED ORDER — MELOXICAM 7.5 MG PO TABS: 7.5000 mg | ORAL_TABLET | Freq: Every day | ORAL | 0 refills | Status: DC

## 2018-07-20 MED ORDER — HYDROCODONE-ACETAMINOPHEN 5-325 MG PO TABS: 1.0000 | ORAL_TABLET | Freq: Four times a day (QID) | ORAL | 0 refills | Status: DC | PRN

## 2018-07-20 NOTE — Discharge Instructions (Signed)
As discussed, x-ray showed fracture to the left great toe.  Buddy tape, postop boot as directed.  Mobic for pain.  Norco for breakthrough pain.  Ice compress, elevation, rest.  Please follow-up with orthopedics for further evaluation and management needed.

## 2018-07-20 NOTE — ED Provider Notes (Signed)
[facility]    CSN: [id number] Arrival date & time: 07/20/18  1130     History   Chief Complaint Chief Complaint  Patient presents with  . Toe Pain    HPI Stephanie Stephens is a 52 y.o. female.   52 year old female comes in with left toe pain since injury last night.  States she tripped and fell and kicked her left foot against the door.  Pain to the left great toe.  Denies numbness, tingling.  Mild swelling to the toe.  States still able to move the toe.  Soak area without improvement.     Past Medical History:  Diagnosis Date  . Anemia   . Asthma   . Colon polyps   . Depression   . Diverticulosis   . Genital herpes   . Hx of adenomatous colonic polyps   . Pancreatitis   . Sarcoidosis   . Umbilical hernia     Patient Active Problem List   Diagnosis Date Noted  . Food allergy 04/15/2017  . CAP (community acquired pneumonia) 10/02/2013  . Cigarette smoker in remission 06/27/2013  . GERD 09/04/2010  . PHARYNGITIS, ACUTE 03/15/2009  . BRONCHITIS 08/24/2008  . ALLERGIC CONJUNCTIVITIS 01/27/2008  . PULMONARY SARCOIDOSIS 10/03/2007  . Allergic conjunctivitis and rhinitis 10/03/2007  . Asthma, mild persistent 10/03/2007    Past Surgical History:  Procedure Laterality Date  . CESAREAN SECTION     x 3  . CHOLECYSTECTOMY     lap  . HERNIA REPAIR     x 4  . PARTIAL HYSTERECTOMY    . TONSILLECTOMY      OB History   None      Home Medications    Prior to Admission medications   Medication Sig Start Date End Date Taking? Authorizing Provider  albuterol (PROVENTIL HFA;VENTOLIN HFA) 108 (90 Base) MCG/ACT inhaler Inhale 2 puffs into the lungs every 4 (four) hours as needed. Asthma 04/10/18 03/17/21  Jetty Duhamel D, MD  albuterol (PROVENTIL) (2.5 MG/3ML) 0.083% nebulizer solution USE 1 VIAL IN NEBULIZER 4 TIMES DAILY. Generic: VENTOLIN 04/10/18   Young, Joni Fears D, MD  budesonide-formoterol (SYMBICORT) 160-4.5 MCG/ACT inhaler inhale 2 puffs by mouth  FIRST THING IN THE MORNING then 2 puffs 12 HOURS LATER 04/10/18   Waymon Budge, MD  dicyclomine (BENTYL) 10 MG capsule Take 1 capsule (10 mg total) by mouth every 8 (eight) hours as needed for spasms. Patient not taking: Reported on 07/11/2018 03/11/18   Benancio Deeds, MD  diphenhydrAMINE (BENADRYL) 25 MG tablet Take 25 mg by mouth daily.     [provider]  docusate sodium (COLACE) 100 MG capsule Take 1 capsule (100 mg total) by mouth every 12 (twelve) hours. Patient taking differently: Take 100 mg by mouth daily.  05/26/16   Arby Barrette, MD  doxycycline (VIBRAMYCIN) 100 MG capsule Take 1 capsule (100 mg total) by mouth 2 (two) times daily. 07/11/18   Liberty Handy, PA-C  HYDROcodone-acetaminophen (NORCO/VICODIN) 5-325 MG tablet Take 1 tablet by mouth every 6 (six) hours as needed for severe pain. 07/20/18   Cathie Hoops, Dakotah Heiman V, PA-C  meloxicam (MOBIC) 7.5 MG tablet Take 1 tablet (7.5 mg total) by mouth daily. 07/20/18   Cathie Hoops, Terrace Fontanilla V, PA-C  methylcellulose (CITRUCEL) oral powder Use daily as directed Patient not taking: Reported on 07/11/2018 03/11/18   Benancio Deeds, MD  Multiple Vitamin (MULTIVITAMIN WITH MINERALS) TABS Take 1 tablet by mouth daily.    [provider]  omeprazole (PRILOSEC) 40 MG capsule Take 1 capsule (40 mg total) by mouth daily. Patient not taking: Reported on 07/11/2018 03/11/18   Armbruster, Willaim Rayas, MD    Family History Family History  Problem Relation Age of Onset  . Ovarian cancer Mother   . Colon polyps Mother   . Breast cancer Sister   . Colon cancer Father   . Stomach cancer Neg Hx   . Esophageal cancer Neg Hx     Social History Social History   Tobacco Use  . Smoking status: Former Smoker    Packs/day: 0.00    Types: Cigarettes  . Smokeless tobacco: Never Used  . Tobacco comment: Stopped smoking April 2019   Substance Use Topics  . Alcohol use: Yes    Alcohol/week: 0.0 standard drinks    Comment: daily the last month  . Drug  use: Yes    Types: Marijuana    Comment: last used yesterday     Allergies   Bee venom; Mango flavor; Ciprofloxacin; Latex; and Penicillins   Review of Systems Review of Systems  Reason unable to perform ROS: See HPI as above.     Physical Exam Triage Vital Signs ED Triage Vitals [07/20/18 1149]  Enc Vitals Group     BP 109/71     Pulse Rate 91     Resp 18     Temp 97.9 F (36.6 C)     Temp src      SpO2 100 %     Weight      Height      Head Circumference      Peak Flow      Pain Score      Pain Loc      Pain Edu?      Excl. in GC?    No data found.  Updated Vital Signs BP 109/71   Pulse 91   Temp 97.9 F (36.6 C)   Resp 18   SpO2 100%   Physical Exam  Constitutional: She is oriented to person, place, and time. She appears well-developed and well-nourished. No distress.  HENT:  Head: Normocephalic and atraumatic.  Eyes: Pupils are equal, round, and reactive to light. Conjunctivae are normal.  Musculoskeletal:  Mild swelling to the left great toe.  No contusion, erythema, warmth.  No tenderness to palpation of left first MTP joint.  Diffuse tenderness to palpation of left great toe.  Full range of motion of toe.  Sensation intact.  Pedal pulse 2+, cap refill less than 2 seconds.  Neurological: She is alert and oriented to person, place, and time.  Skin: She is not diaphoretic.     UC Treatments / Results  Labs (all labs ordered are listed, but only abnormal results are displayed) Labs Reviewed - No data to display  EKG None  Radiology Dg Foot Complete Left  Result Date: 07/20/2018 CLINICAL DATA:  Larey Seat and injured the LEFT foot. Pain localizing to the LEFT great toe. Initial encounter. EXAM: LEFT FOOT - COMPLETE 3+ VIEW COMPARISON:  12/14/2010. FINDINGS: Acute nondisplaced fracture involving the base of the distal phalanx of the great toe with intra-articular extension. No fractures elsewhere. Well preserved joint spaces. Well-preserved bone mineral  density. IMPRESSION: Acute nondisplaced intra-articular fracture involving the base of the distal phalanx of the great toe. Electronically Signed   By: Hulan Saas M.D.   On: 07/20/2018 12:54    Procedures Procedures (including critical care time)  Medications Ordered in UC Medications - No data  to display  Initial Impression / Assessment and Plan / UC Course  I have reviewed the triage vital signs and the nursing notes.  Pertinent labs & imaging results that were available during my care of the patient were reviewed by me and considered in my medical decision making (see chart for details).    Discussed x-ray results with patient.  Postop boot, buddy tape.  Mobic for pain, Norco for breakthrough pain.  Ice compress, elevation, rest.  Patient to follow-up with orthopedics for further evaluation and management needed.  Final Clinical Impressions(s) / UC Diagnoses   Final diagnoses:  Nondisplaced fracture of distal phalanx of left great toe, initial encounter for closed fracture    ED Prescriptions    Medication Sig Dispense Auth. Provider   meloxicam (MOBIC) 7.5 MG tablet Take 1 tablet (7.5 mg total) by mouth daily. 15 tablet Shekina Cordell V, PA-C   HYDROcodone-acetaminophen (NORCO/VICODIN) 5-325 MG tablet Take 1 tablet by mouth every 6 (six) hours as needed for severe pain. 10 tablet Belinda Fisher, PA-C     Controlled Substance Prescriptions [place] Controlled Substance Registry consulted? Yes, I have consulted the [place] Controlled Substances Registry for this patient, and feel the risk/benefit ratio today is favorable for proceeding with this prescription for a controlled substance.   Belinda Fisher, PA-C 07/20/18 1720

## 2018-07-20 NOTE — ED Triage Notes (Signed)
Pt states she tripped and fell and kicked her L foot against the floor and thinks she broke her L big toe.

## 2018-07-23 DIAGNOSIS — S92403A Displaced unspecified fracture of unspecified great toe, initial encounter for closed fracture: Secondary | ICD-10-CM | POA: Insufficient documentation

## 2018-07-23 DIAGNOSIS — M79675 Pain in left toe(s): Secondary | ICD-10-CM | POA: Insufficient documentation

## 2018-10-28 DIAGNOSIS — R196 Halitosis: Secondary | ICD-10-CM | POA: Insufficient documentation

## 2018-10-28 DIAGNOSIS — J392 Other diseases of pharynx: Secondary | ICD-10-CM | POA: Insufficient documentation

## 2018-10-28 DIAGNOSIS — J301 Allergic rhinitis due to pollen: Secondary | ICD-10-CM | POA: Insufficient documentation

## 2018-11-26 ENCOUNTER — Telehealth: Payer: Self-pay | Admitting: Internal Medicine

## 2018-11-26 NOTE — Telephone Encounter (Signed)
Called and spoke with patient, she stated that she is needing a letter for her Arthritis. She lives in an upstairs apartment and is needing a letter stating she needs to live somewhere else due to her arthritis. If she does not have the letter her landlord will not let her move. CY please advise, thank you.

## 2018-11-27 NOTE — Telephone Encounter (Signed)
Called pt and advised message from the provider. Pt understood and verbalized understanding. Nothing further is needed.    

## 2018-11-27 NOTE — Telephone Encounter (Signed)
I don't treat for arthritis. Does she not have a primary physician or someone else who could help her with this?

## 2018-12-12 ENCOUNTER — Encounter (HOSPITAL_COMMUNITY): Payer: Self-pay | Admitting: Emergency Medicine

## 2018-12-12 ENCOUNTER — Emergency Department (HOSPITAL_COMMUNITY): Payer: Medicare Other

## 2018-12-12 ENCOUNTER — Emergency Department (HOSPITAL_COMMUNITY)
Admission: EM | Admit: 2018-12-12 | Discharge: 2018-12-12 | Disposition: A | Discharge status: Home / Self Care | Payer: Medicare Other | Attending: Emergency Medicine | Admitting: Emergency Medicine

## 2018-12-12 DIAGNOSIS — Z91040 Latex allergy status: Secondary | ICD-10-CM | POA: Diagnosis not present

## 2018-12-12 DIAGNOSIS — K0889 Other specified disorders of teeth and supporting structures: Secondary | ICD-10-CM | POA: Diagnosis present

## 2018-12-12 DIAGNOSIS — K047 Periapical abscess without sinus: Secondary | ICD-10-CM | POA: Diagnosis not present

## 2018-12-12 DIAGNOSIS — J45909 Unspecified asthma, uncomplicated: Secondary | ICD-10-CM | POA: Diagnosis not present

## 2018-12-12 DIAGNOSIS — Z87891 Personal history of nicotine dependence: Secondary | ICD-10-CM | POA: Insufficient documentation

## 2018-12-12 DIAGNOSIS — Z79899 Other long term (current) drug therapy: Secondary | ICD-10-CM | POA: Diagnosis not present

## 2018-12-12 LAB — CBC WITH DIFFERENTIAL/PLATELET
ABS IMMATURE GRANULOCYTES: 0.02 10*3/uL (ref 0.00–0.07)
Basophils Absolute: 0 10*3/uL (ref 0.0–0.1)
Basophils Relative: 0 %
EOS ABS: 0.1 10*3/uL (ref 0.0–0.5)
Eosinophils Relative: 1 %
HEMATOCRIT: 44.3 % (ref 36.0–46.0)
Hemoglobin: 13.8 g/dL (ref 12.0–15.0)
IMMATURE GRANULOCYTES: 0 %
LYMPHS ABS: 3 10*3/uL (ref 0.7–4.0)
Lymphocytes Relative: 41 %
MCH: 30.9 pg (ref 26.0–34.0)
MCHC: 31.2 g/dL (ref 30.0–36.0)
MCV: 99.3 fL (ref 80.0–100.0)
Monocytes Absolute: 0.6 10*3/uL (ref 0.1–1.0)
Monocytes Relative: 8 %
NEUTROS PCT: 50 %
Neutro Abs: 3.7 10*3/uL (ref 1.7–7.7)
Platelets: 289 10*3/uL (ref 150–400)
RBC: 4.46 MIL/uL (ref 3.87–5.11)
RDW: 13.2 % (ref 11.5–15.5)
WBC: 7.4 10*3/uL (ref 4.0–10.5)
nRBC: 0 % (ref 0.0–0.2)

## 2018-12-12 LAB — BASIC METABOLIC PANEL
ANION GAP: 8 (ref 5–15)
BUN: 15 mg/dL (ref 6–20)
CALCIUM: 10.2 mg/dL (ref 8.9–10.3)
CHLORIDE: 105 mmol/L (ref 98–111)
CO2: 27 mmol/L (ref 22–32)
Creatinine, Ser: 0.79 mg/dL (ref 0.44–1.00)
GFR calc Af Amer: >60 mL/min (ref >60)
Glucose, Bld: 70 mg/dL (ref 70–99)
Potassium: 4.1 mmol/L (ref 3.5–5.1)
Sodium: 140 mmol/L (ref 135–145)

## 2018-12-12 MED ORDER — CLINDAMYCIN HCL 300 MG PO CAPS: 300.0000 mg | ORAL_CAPSULE | Freq: Three times a day (TID) | ORAL | 0 refills | Status: DC

## 2018-12-12 MED ORDER — SODIUM CHLORIDE (PF) 0.9 % IJ SOLN
INTRAMUSCULAR | Status: AC
  Filled 2018-12-12: qty 50

## 2018-12-12 MED ORDER — FAMOTIDINE 20 MG PO TABS: 20.0000 mg | ORAL_TABLET | Freq: Two times a day (BID) | ORAL | 0 refills | Status: DC

## 2018-12-12 MED ORDER — NAPROXEN 500 MG PO TABS: 500.0000 mg | ORAL_TABLET | Freq: Two times a day (BID) | ORAL | 0 refills | Status: DC

## 2018-12-12 MED ORDER — IOPAMIDOL (ISOVUE-300) INJECTION 61%
INTRAVENOUS | Status: AC
  Filled 2018-12-12: qty 100

## 2018-12-12 MED ORDER — TRAMADOL HCL 50 MG PO TABS: 50.0000 mg | ORAL_TABLET | Freq: Two times a day (BID) | ORAL | 0 refills | Status: DC | PRN

## 2018-12-12 MED ORDER — CLINDAMYCIN HCL 300 MG PO CAPS
300.0000 mg | ORAL_CAPSULE | Freq: Once | ORAL | Status: AC
  Administered 2018-12-12: 300 mg via ORAL
  Filled 2018-12-12: qty 1

## 2018-12-12 MED ORDER — OXYCODONE-ACETAMINOPHEN 5-325 MG PO TABS
1.0000 | ORAL_TABLET | Freq: Once | ORAL | Status: AC
  Administered 2018-12-12: 1 via ORAL
  Filled 2018-12-12: qty 1

## 2018-12-12 MED ORDER — IOPAMIDOL (ISOVUE-300) INJECTION 61%
100.0000 mL | Freq: Once | INTRAVENOUS | Status: AC | PRN
  Administered 2018-12-12: 100 mL via INTRAVENOUS

## 2018-12-12 NOTE — Discharge Instructions (Addendum)
Your CT scan today does not show an abscess. We are starting you on antibiotics. Call the oral surgeon for follow up as soon as possible. Do not drive while taking the narcotic as it will make you sleepy.

## 2018-12-12 NOTE — ED Provider Notes (Signed)
[facility]-EMERGENCY DEPT Provider Note   CSN: [id number] Arrival date & time: 12/12/18  1524     History   Chief Complaint Chief Complaint  Patient presents with  . Dental Pain    HPI Stephanie Stephens is a 53 y.o. female who presents to the ED with dental pain. Patient has had this problem since December but can't see the oral surgeon until Jan. 29th in [place]. Patient reports the pain has gotten worse and she can't eat due to pain. She states that the dentist told her the second molar on the lower left was broken and turned the wrong way and pushing on the first molar. Patient reports taking tylenol and ibuprofen without relief. Patient reports having bleeding from her nose one morning when she woke.  HPI  Past Medical History:  Diagnosis Date  . Anemia   . Asthma   . Colon polyps   . Depression   . Diverticulosis   . Genital herpes   . Hx of adenomatous colonic polyps   . Pancreatitis   . Sarcoidosis   . Umbilical hernia     Patient Active Problem List   Diagnosis Date Noted  . Food allergy 04/15/2017  . CAP (community acquired pneumonia) 10/02/2013  . Cigarette smoker in remission 06/27/2013  . GERD 09/04/2010  . PHARYNGITIS, ACUTE 03/15/2009  . BRONCHITIS 08/24/2008  . ALLERGIC CONJUNCTIVITIS 01/27/2008  . PULMONARY SARCOIDOSIS 10/03/2007  . Allergic conjunctivitis and rhinitis 10/03/2007  . Asthma, mild persistent 10/03/2007    Past Surgical History:  Procedure Laterality Date  . CESAREAN SECTION     x 3  . CHOLECYSTECTOMY     lap  . HERNIA REPAIR     x 4  . PARTIAL HYSTERECTOMY    . TONSILLECTOMY       OB History   No obstetric history on file.      Home Medications    Prior to Admission medications   Medication Sig Start Date End Date Taking? Authorizing Provider  albuterol (PROVENTIL HFA;VENTOLIN HFA) 108 (90 Base) MCG/ACT inhaler Inhale 2 puffs into the lungs every 4 (four) hours as needed. Asthma 04/10/18  03/17/21  Jetty Duhamel D, MD  albuterol (PROVENTIL) (2.5 MG/3ML) 0.083% nebulizer solution USE 1 VIAL IN NEBULIZER 4 TIMES DAILY. Generic: VENTOLIN 04/10/18   Young, Joni Fears D, MD  budesonide-formoterol (SYMBICORT) 160-4.5 MCG/ACT inhaler inhale 2 puffs by mouth FIRST THING IN THE MORNING then 2 puffs 12 HOURS LATER 04/10/18   Jetty Duhamel D, MD  clindamycin (CLEOCIN) 300 MG capsule Take 1 capsule (300 mg total) by mouth 3 (three) times daily. 12/12/18   Janne Napoleon, NP  clindamycin (CLEOCIN) 300 MG capsule Take 1 capsule (300 mg total) by mouth 3 (three) times daily. 12/12/18   Janne Napoleon, NP  dicyclomine (BENTYL) 10 MG capsule Take 1 capsule (10 mg total) by mouth every 8 (eight) hours as needed for spasms. Patient not taking: Reported on 07/11/2018 03/11/18   Benancio Deeds, MD  diphenhydrAMINE (BENADRYL) 25 MG tablet Take 25 mg by mouth daily.     [provider]  docusate sodium (COLACE) 100 MG capsule Take 1 capsule (100 mg total) by mouth every 12 (twelve) hours. Patient taking differently: Take 100 mg by mouth daily.  05/26/16   Arby Barrette, MD  famotidine (PEPCID) 20 MG tablet Take 1 tablet (20 mg total) by mouth 2 (two) times daily. 12/12/18   Janne Napoleon, NP  HYDROcodone-acetaminophen (NORCO/VICODIN) [phone]  MG tablet Take 1 tablet by mouth every 6 (six) hours as needed for severe pain. 07/20/18   Cathie HoopsYu, Amy V, PA-C  meloxicam (MOBIC) 7.5 MG tablet Take 1 tablet (7.5 mg total) by mouth daily. 07/20/18   Cathie HoopsYu, Amy V, PA-C  methylcellulose (CITRUCEL) oral powder Use daily as directed Patient not taking: Reported on 07/11/2018 03/11/18   Benancio DeedsArmbruster, Steven P, MD  Multiple Vitamin (MULTIVITAMIN WITH MINERALS) TABS Take 1 tablet by mouth daily.    [provider]  naproxen (NAPROSYN) 500 MG tablet Take 1 tablet (500 mg total) by mouth 2 (two) times daily. 12/12/18   Janne NapoleonNeese, Hope M, NP  omeprazole (PRILOSEC) 40 MG capsule Take 1 capsule (40 mg total) by mouth daily. Patient not  taking: Reported on 07/11/2018 03/11/18   Benancio DeedsArmbruster, Steven P, MD  traMADol (ULTRAM) 50 MG tablet Take 1 tablet (50 mg total) by mouth every 12 (twelve) hours as needed for severe pain. 12/12/18   Janne NapoleonNeese, Hope M, NP    Family History Family History  Problem Relation Age of Onset  . Ovarian cancer Mother   . Colon polyps Mother   . Breast cancer Sister   . Colon cancer Father   . Stomach cancer Neg Hx   . Esophageal cancer Neg Hx     Social History Social History   Tobacco Use  . Smoking status: Former Smoker    Packs/day: 0.00    Types: Cigarettes  . Smokeless tobacco: Never Used  . Tobacco comment: Stopped smoking April 2019   Substance Use Topics  . Alcohol use: Yes    Alcohol/week: 0.0 standard drinks    Comment: daily the last month  . Drug use: Yes    Types: Marijuana    Comment: last used yesterday     Allergies   Bee venom; Mango flavor; Ciprofloxacin; Latex; and Penicillins   Review of Systems Review of Systems  HENT: Positive for dental problem, facial swelling and nosebleeds (once).   Hematological: Positive for adenopathy.  All other systems reviewed and are negative.    Physical Exam Updated Vital Signs BP (!) 144/91 (BP Location: Left Arm)   Pulse 85   Temp 98.5 F (36.9 C) (Oral)   Resp 18   SpO2 100%   Physical Exam Vitals signs and nursing note reviewed.  Constitutional:      General: She is not in acute distress.    Appearance: She is well-developed.  HENT:     Head: Normocephalic.     Right Ear: Tympanic membrane normal.     Left Ear: Tympanic membrane normal.     Nose:     Right Nostril: No epistaxis.     Mouth/Throat:     Mouth: Mucous membranes are moist.     Dentition: Dental tenderness, gingival swelling and dental caries present.   Eyes:     Extraocular Movements: Extraocular movements intact.     Conjunctiva/sclera: Conjunctivae normal.  Neck:     Musculoskeletal: Neck supple.  Cardiovascular:     Rate and Rhythm:  Normal rate.  Pulmonary:     Effort: Pulmonary effort is normal.  Abdominal:     Palpations: Abdomen is soft.     Tenderness: There is no abdominal tenderness.  Musculoskeletal: Normal range of motion.  Lymphadenopathy:     Cervical: Cervical adenopathy present.  Skin:    General: Skin is warm and dry.  Neurological:     Mental Status: She is alert and oriented to person, place, and time.  Cranial Nerves: No cranial nerve deficit.  Psychiatric:        Mood and Affect: Mood is anxious.     Comments: Patient crying and upset stating pain is severe.       ED Treatments / Results  Labs (all labs ordered are listed, but only abnormal results are displayed) Labs Reviewed  BASIC METABOLIC PANEL  CBC WITH DIFFERENTIAL/PLATELET   Radiology Ct Maxillofacial W Contrast  Result Date: 12/12/2018 CLINICAL DATA:  Facial and dental pain. EXAM: CT MAXILLOFACIAL WITH CONTRAST TECHNIQUE: Multidetector CT imaging of the maxillofacial structures was performed with intravenous contrast. Multiplanar CT image reconstructions were also generated. CONTRAST:  ISOVUE-300 IOPAMIDOL (ISOVUE-300) INJECTION 61% COMPARISON:  02/03/2008 FINDINGS: Osseous: No traumatic bone finding. There is evidence of periodontal disease affecting the maxillary molars, more extensive on the right than the left. Minor/lesser changes at the left mandibular molar tooth 19. a Orbits: Normal Sinuses: Few maxillary sinus retention cysts without evidence of widespread mucosal inflammation or layering fluid. Other paranasal sinuses are clear. Soft tissues: Other soft tissues of the face appear negative. No definite sign of facial cellulitis. One could question mild swelling of the lower lip, but this would be more obvious clinically. Limited intracranial: Normal IMPRESSION: Evidence of periodontal disease of the maxillary molars right worse than left with lucency around the roots. Minimal/lesser changes at the left mandibular molar  tooth 19 Few likely insignificant retention cysts of the maxillary sinuses. Can not rule out soft tissue swelling of the lower lip region. No sign of abscess. Electronically Signed   By: Paulina Fusi M.D.   On: 12/12/2018 19:54    Procedures Procedures (including critical care time)  Medications Ordered in ED Medications  sodium chloride (PF) 0.9 % injection (has no administration in time range)  clindamycin (CLEOCIN) capsule 300 mg (has no administration in time range)  oxyCODONE-acetaminophen (PERCOCET/ROXICET) 5-325 MG per tablet 1 tablet (1 tablet Oral Given 12/12/18 1753)  iopamidol (ISOVUE-300) 61 % injection 100 mL (100 mLs Intravenous Contrast Given 12/12/18 1932)     Initial Impression / Assessment and Plan / ED Course  I have reviewed the triage vital signs and the nursing notes. Patient with toothache.  No gross abscess.  Exam unconcerning for Ludwig's angina or spread of infection.  Will treat with Clindamycin and anti-inflammatories medicine and 10 Tramadol.  Urged patient to follow-up as scheduled.  Final Clinical Impressions(s) / ED Diagnoses   Final diagnoses:  Dental infection    ED Discharge Orders         Ordered    clindamycin (CLEOCIN) 300 MG capsule  3 times daily     12/12/18 2001    clindamycin (CLEOCIN) 300 MG capsule  3 times daily     12/12/18 2017    naproxen (NAPROSYN) 500 MG tablet  2 times daily     12/12/18 2017    traMADol (ULTRAM) 50 MG tablet  Every 12 hours PRN     12/12/18 2017    famotidine (PEPCID) 20 MG tablet  2 times daily     12/12/18 2017           Kerrie Buffalo [facility], [place] 12/12/18 2026    Benjiman Core, MD 12/13/18 0003

## 2018-12-12 NOTE — ED Notes (Signed)
Patient transported to CT 

## 2018-12-12 NOTE — ED Triage Notes (Signed)
Pt c/o left lower dental pain and cant get into surgeon office until jan 29. Reports has nose bleeds during night.

## 2018-12-25 ENCOUNTER — Ambulatory Visit: Payer: Medicare Other

## 2019-02-02 ENCOUNTER — Telehealth: Payer: Self-pay | Admitting: Internal Medicine

## 2019-02-02 NOTE — Telephone Encounter (Signed)
Spoke with the pt and advised avoid crowds, frequent hand washing, avoid touching face Pt verbalized understanding  She states wants to wear a mask due to pollen  Nothing further needed

## 2019-02-03 ENCOUNTER — Ambulatory Visit: Payer: Medicare Other

## 2019-02-26 ENCOUNTER — Ambulatory Visit: Payer: Medicare Other

## 2019-03-23 ENCOUNTER — Ambulatory Visit: Payer: Medicare Other | Admitting: Allergy

## 2019-03-24 ENCOUNTER — Ambulatory Visit: Payer: Medicare Other | Admitting: Allergy and Immunology

## 2019-05-04 ENCOUNTER — Other Ambulatory Visit: Payer: Self-pay

## 2019-05-04 ENCOUNTER — Ambulatory Visit
Admission: RE | Admit: 2019-05-04 | Discharge: 2019-05-04 | Disposition: A | Discharge status: Home / Self Care | Payer: Medicare Other | Source: Ambulatory Visit | Attending: Internal Medicine | Admitting: Internal Medicine

## 2019-05-04 DIAGNOSIS — Z1231 Encounter for screening mammogram for malignant neoplasm of breast: Secondary | ICD-10-CM

## 2019-07-09 ENCOUNTER — Other Ambulatory Visit: Payer: Self-pay | Admitting: Internal Medicine

## 2019-09-29 ENCOUNTER — Ambulatory Visit (INDEPENDENT_AMBULATORY_CARE_PROVIDER_SITE_OTHER): Payer: Medicare Other

## 2019-09-29 ENCOUNTER — Other Ambulatory Visit: Payer: Self-pay

## 2019-09-29 ENCOUNTER — Ambulatory Visit (INDEPENDENT_AMBULATORY_CARE_PROVIDER_SITE_OTHER): Payer: Medicare Other | Admitting: Internal Medicine

## 2019-09-29 ENCOUNTER — Encounter: Payer: Self-pay | Admitting: Internal Medicine

## 2019-09-29 VITALS — BP 104/62 | HR 65 | Temp 97.3°F | Ht 64.0 in | Wt 143.0 lb

## 2019-09-29 DIAGNOSIS — D869 Sarcoidosis, unspecified: Secondary | ICD-10-CM

## 2019-09-29 DIAGNOSIS — Z720 Tobacco use: Secondary | ICD-10-CM

## 2019-09-29 MED ORDER — ALBUTEROL SULFATE (2.5 MG/3ML) 0.083% IN NEBU: INHALATION_SOLUTION | RESPIRATORY_TRACT | 12 refills | Status: DC

## 2019-09-29 MED ORDER — FAMOTIDINE 20 MG PO TABS: 20.0000 mg | ORAL_TABLET | Freq: Two times a day (BID) | ORAL | 12 refills | Status: DC

## 2019-09-29 MED ORDER — BUDESONIDE-FORMOTEROL FUMARATE 160-4.5 MCG/ACT IN AERO: INHALATION_SPRAY | RESPIRATORY_TRACT | 4 refills | Status: DC

## 2019-09-29 MED ORDER — PROAIR HFA 108 (90 BASE) MCG/ACT IN AERS: INHALATION_SPRAY | RESPIRATORY_TRACT | 4 refills | Status: DC

## 2019-09-29 NOTE — Assessment & Plan Note (Signed)
With stress from loss of family members she has gone back to smoking. Support and encouragement given.

## 2019-09-29 NOTE — Patient Instructions (Signed)
Meds refilled  Order- CXR  Dx Sarcoid  Please call if we can help

## 2019-09-29 NOTE — Assessment & Plan Note (Signed)
Doubt active. Plan- CXR

## 2019-09-29 NOTE — Progress Notes (Signed)
HPI   Former smoker followed for Sarcoid, allergic rhinitis, allergic conjunctivitis angioedema,, complicated by GERD, cervical node,  ACE 05/2013- 56H Allergy profile 01/20/15- Neg, IgE 22 ACE level 01/20/15- stable at 33 ----------------------------------------------------------------------  04/10/2018- 51 yoF  Former smoker followed for Sarcoid, allergic rhinitis, allergic conjunctivitis, angioedema, complicated by GERD, cervical node, ----Sarcoidosis: Pt is having trouble with flares up-? if ACE level is high. Trouble with breathing, vision, and teeth. Pt would like to have referral to [facility]CP[facility]-Dr. Nancy Fetter.  Symbicort 160, neb albuterol, albuterol hfa,  She complains about her eyesight, saying an optometrist had given her eyedrops for allergy and her eyesight has not been as good since.  The same person fitted her with eyeglasses that worked very well until she dropped and broke the frame.  Wants to see somebody else. Stable small retention cyst left scalp unchanged.  Otherwise no active rash.  Not aware of adenopathy. Persistent nasal stuffiness-using Nettie pot. Chest feels comfortable, admits occasional minor wheeze. Denies night sweats or fever. CXR 11/14/2017 Stable changes related to known sarcoidosis. No active cardiopulmonary disease.  09/29/2019- 89 yoF  Former smoker followed for Sarcoid, allergic rhinitis, allergic conjunctivitis, angioedema, complicated by GERD, cervical node, 1 year f/u Sacoidosis/Asthma Symbicort 160, neb albuterol, albuterol hfa,  Declines flu vax Distraught, grieving deaths in family- one w MVA. One w lung CA (smoker). She has started back smoking again, tearful, feels tight through chest persistently. Declines flu vax. Mild occasional cough, scant clear phlegm, no fever, blood, nodes. Had some itching L lat rib area, resolved with no rash.   ROS-see HPI   + = positive Constitutional:    weight loss, night sweats, fevers, chills, fatigue,  lassitude. HEENT:    headaches, difficulty swallowing, tooth/dental problems, sore throat,       sneezing, itching, ear ache, + nasal congestion, post nasal drip, snoring CV:    chest pain, orthopnea, PND, swelling in lower extremities, anasarca,                                                     dizziness, palpitations Resp:   shortness of breath with exertion or at rest.                 productive cough,   non-productive cough, coughing up of blood.              change in color of mucus.  wheezing.   Skin:    rash or lesions. GI:  No-   heartburn, indigestion, abdominal pain, nausea, vomiting, diarrhea,                 change in bowel habits, loss of appetite GU: dysuria, change in color of urine, no urgency or frequency.   flank pain. MS:   joint pain, stiffness, decreased range of motion, back pain. Neuro-     nothing unusual Psych:  change in mood or affect.  depression or anxiety.   memory loss.  OBJ- Physical Exam General- Alert, Oriented, Affect-+tearful, Distress- none acute Skin- rash-none, lesions- none, excoriation- none Lymphadenopathy- none Head- atraumatic            Eyes- Gross vision intact, PERRLA, conjunctivae and secretions clear            Ears- Hearing, canals-normal  Nose-  turbinate edema, no-Septal dev, mucus, polyps, erosion, perforation             Throat- Mallampati II , mucosa clear , drainage- none, tonsils- atrophic Neck- flexible , trachea midline, no stridor , thyroid nl, carotid no bruit Chest - symmetrical excursion , unlabored           Heart/CV- RRR , no murmur , no gallop  , no rub, nl s1 s2                           - JVD- none , edema- none, stasis changes- none, varices- none           Lung- clear to P&A, wheeze- none, cough- none , dullness-none, rub- none           Chest wall-  Abd-  Br/ Gen/ Rectal- Not done, not indicated Extrem- cyanosis- none, clubbing, none, atrophy- none, strength- nl Neuro- grossly intact to  observation

## 2019-12-28 ENCOUNTER — Other Ambulatory Visit: Payer: Self-pay | Admitting: Internal Medicine

## 2019-12-28 DIAGNOSIS — E210 Primary hyperparathyroidism: Secondary | ICD-10-CM

## 2019-12-31 ENCOUNTER — Ambulatory Visit
Admission: RE | Admit: 2019-12-31 | Discharge: 2019-12-31 | Disposition: A | Discharge status: Home / Self Care | Payer: Medicare Other | Source: Ambulatory Visit | Attending: Internal Medicine | Admitting: Internal Medicine

## 2019-12-31 DIAGNOSIS — E210 Primary hyperparathyroidism: Secondary | ICD-10-CM

## 2020-02-24 ENCOUNTER — Other Ambulatory Visit: Payer: Self-pay | Admitting: Internal Medicine

## 2020-02-24 DIAGNOSIS — E210 Primary hyperparathyroidism: Secondary | ICD-10-CM

## 2020-03-08 ENCOUNTER — Telehealth: Payer: Self-pay | Admitting: Internal Medicine

## 2020-03-08 MED ORDER — PROAIR HFA 108 (90 BASE) MCG/ACT IN AERS: INHALATION_SPRAY | RESPIRATORY_TRACT | 6 refills | Status: DC

## 2020-03-08 MED ORDER — BUDESONIDE-FORMOTEROL FUMARATE 160-4.5 MCG/ACT IN AERO: INHALATION_SPRAY | RESPIRATORY_TRACT | 6 refills | Status: DC

## 2020-03-08 NOTE — Telephone Encounter (Signed)
Spoke with patient. She was requesting a refill on her Symbicort and Proair inhalers to be sent to [facility] on [address]. Advised her that I would go ahead and send these in for her. She verbalized understanding.   Nothing further needed at time of call.

## 2020-04-06 ENCOUNTER — Other Ambulatory Visit: Payer: Self-pay | Admitting: Family Medicine

## 2020-04-06 DIAGNOSIS — Z1231 Encounter for screening mammogram for malignant neoplasm of breast: Secondary | ICD-10-CM

## 2020-04-28 ENCOUNTER — Encounter: Payer: Medicare Other | Attending: Obstetrics and Gynecology | Admitting: Skilled Nursing Facility1

## 2020-04-28 ENCOUNTER — Ambulatory Visit
Admission: RE | Admit: 2020-04-28 | Discharge: 2020-04-28 | Disposition: A | Discharge status: Home / Self Care | Payer: Medicare Other | Source: Ambulatory Visit | Attending: Internal Medicine | Admitting: Internal Medicine

## 2020-04-28 ENCOUNTER — Other Ambulatory Visit: Payer: Self-pay

## 2020-04-28 DIAGNOSIS — R7303 Prediabetes: Secondary | ICD-10-CM | POA: Insufficient documentation

## 2020-04-28 DIAGNOSIS — E210 Primary hyperparathyroidism: Secondary | ICD-10-CM

## 2020-05-05 ENCOUNTER — Ambulatory Visit
Admission: RE | Admit: 2020-05-05 | Discharge: 2020-05-05 | Disposition: A | Discharge status: Home / Self Care | Payer: Medicaid Other | Source: Ambulatory Visit | Attending: Family Medicine | Admitting: Family Medicine

## 2020-05-05 ENCOUNTER — Other Ambulatory Visit: Payer: Self-pay

## 2020-05-05 DIAGNOSIS — Z1231 Encounter for screening mammogram for malignant neoplasm of breast: Secondary | ICD-10-CM

## 2020-05-06 ENCOUNTER — Encounter: Payer: Medicare Other | Admitting: Dietician

## 2020-05-06 ENCOUNTER — Other Ambulatory Visit: Payer: Self-pay

## 2020-05-06 DIAGNOSIS — R7303 Prediabetes: Secondary | ICD-10-CM | POA: Diagnosis not present

## 2020-05-06 NOTE — Progress Notes (Signed)
Medical Nutrition Therapy   Primary concerns today: blood sugar management   Referral diagnosis: R73.03-prediabetes  Preferred learning style: no preference indicated Learning readiness: ready   NUTRITION ASSESSMENT   Clinical Labs: A1c 5.9% (03/23/20) Notable Signs/Symptoms: (pt reported) sees black spots, blurry vision, loses balance, dry mouth  Lifestyle & Dietary Hx Typical meal pattern is 3 meals per day plus snacks. States some days she snacks all day. Common foods include potatoes, potato chips, fries, fruit, sandwiches, and seafood. Likes juice and sweets.   24-Hr Dietary Recall First Meal: fruit + biscuit (or cinnamon toast, or egg muffin, or pancake)  Snack: potato chips  Second Meal: grilled cheese (or hot dog w/ chili & slaw)  Snack: cantaloupe + watermelon  Third Meal: chicken alfredo + broccoli (or fish)  Snack: dessert  Beverages: fruit, tea, ginger ale, OJ, grape juice, apple juice  Estimated Energy Needs Calories: 1600 Carbohydrate: 180g Protein: 100g Fat: 53g   NUTRITION DIAGNOSIS  Altered nutrition related laboratory value ([place]-2.2) related to prediabetes as evidenced by A1c value of 5.9%.    NUTRITION INTERVENTION  Nutrition education (E-1) on the following topics:  . Prediabetes . Healthful balanced eating   Handouts Provided Include   MyPlate Portions & Meal Ideas   Balanced Snacks  Breakfast Ideas   Learning Style & Readiness for Change Teaching method utilized: Visual & Auditory  Demonstrated degree of understanding via: Teach Back  Barriers to learning/adherence to lifestyle change: None Identified   MONITORING & EVALUATION Dietary intake, weekly physical activity, and goals PRN.  Next Steps  Patient is to contact [facility] to schedule follow up visit as needed.

## 2020-05-06 NOTE — Patient Instructions (Signed)
Limit juice and sugary beverages to no more than 4-8 ounces at a time.   Aim to eat at least 3 times per day and always include a source of protein.   Use the Breakfast, Meal, and Snack Ideas sheets to help guide your food choices.

## 2020-05-16 ENCOUNTER — Other Ambulatory Visit: Payer: Self-pay

## 2020-05-16 ENCOUNTER — Encounter: Payer: Self-pay | Admitting: Internal Medicine

## 2020-05-16 ENCOUNTER — Ambulatory Visit (INDEPENDENT_AMBULATORY_CARE_PROVIDER_SITE_OTHER): Payer: Medicare Other

## 2020-05-16 ENCOUNTER — Ambulatory Visit (INDEPENDENT_AMBULATORY_CARE_PROVIDER_SITE_OTHER): Payer: Medicare Other | Admitting: Internal Medicine

## 2020-05-16 VITALS — BP 122/72 | HR 71 | Temp 97.2°F | Ht 64.0 in | Wt 138.2 lb

## 2020-05-16 DIAGNOSIS — D869 Sarcoidosis, unspecified: Secondary | ICD-10-CM | POA: Diagnosis not present

## 2020-05-16 DIAGNOSIS — Z720 Tobacco use: Secondary | ICD-10-CM

## 2020-05-16 NOTE — Patient Instructions (Signed)
Order- CXR    Dx sarcoid  Order- lab- Angiotensin Converting Enzyme Level    Dx Sarcoid  Please call if we can help

## 2020-05-16 NOTE — Progress Notes (Signed)
HPI   Former smoker followed for Sarcoid, allergic rhinitis, allergic conjunctivitis angioedema,, complicated by GERD, cervical node,  ACE 05/2013- 56H Allergy profile 01/20/15- Neg, IgE 22 ACE level 01/20/15- stable at 79 ----------------------------------------------------------------------  09/29/2019- 51 yoF  Former smoker followed for Sarcoid, allergic rhinitis, allergic conjunctivitis, angioedema, complicated by GERD, cervical node, 1 year f/u Sacoidosis/Asthma Symbicort 160, neb albuterol, albuterol hfa,  Declines flu vax Distraught, grieving deaths in family- one w MVA. One w lung CA (smoker). She has started back smoking again, tearful, feels tight through chest persistently. Declines flu vax. Mild occasional cough, scant clear phlegm, no fever, blood, nodes. Had some itching L lat rib area, resolved with no rash.   05/16/20- 76 yoF  Former heavy smoker followed for Sarcoid, allergic rhinitis, allergic conjunctivitis, angioedema, complicated by GERD, cervical node, 1 year f/u Sacoidosis/Asthma Symbicort 160, neb albuterol, albuterol hfa, ------Sarcoidosis Not smoking currently. Declines Covax. Stuffy nose without headache or sinus infection. Cough stable, productive clear mucus. Being evaluated for high Calcium- discussed potential relation to sarcoid.   ROS-see HPI   + = positive Constitutional:    weight loss, night sweats, fevers, chills, fatigue, lassitude. HEENT:    headaches, difficulty swallowing, tooth/dental problems, sore throat,       sneezing, itching, ear ache, + nasal congestion, post nasal drip, snoring CV:    chest pain, orthopnea, PND, swelling in lower extremities, anasarca,                                                     dizziness, palpitations Resp:   shortness of breath with exertion or at rest.                + productive cough,   non-productive cough, coughing up of blood.              change in color of mucus.  wheezing.   Skin:    rash or lesions. GI:   No-   heartburn, indigestion, abdominal pain, nausea, vomiting, diarrhea,                 change in bowel habits, loss of appetite GU: dysuria, change in color of urine, no urgency or frequency.   flank pain. MS:   joint pain, stiffness, decreased range of motion, back pain. Neuro-     nothing unusual Psych:  change in mood or affect.  depression or anxiety.   memory loss.  OBJ- Physical Exam General- Alert, Oriented, Affect-+tearful, Distress- none acute Skin- +rubbery nodules in skin of upper R eyelid Lymphadenopathy- none Head- atraumatic            Eyes- Gross vision intact, PERRLA, conjunctivae and secretions clear            Ears- Hearing, canals-normal            Nose-  turbinate edema, no-Septal dev, mucus, polyps- none seen, erosion, perforation             Throat- Mallampati II , mucosa clear , drainage- none, tonsils- atrophic Neck- flexible , trachea midline, no stridor , thyroid nl, carotid no bruit Chest - symmetrical excursion , unlabored           Heart/CV- RRR , no murmur , no gallop  , no rub, nl s1 s2                           -  JVD- none , edema- none, stasis changes- none, varices- none           Lung- clear to P&A, wheeze- none, cough- none , dullness-none, rub- none           Chest wall-  Abd-  Br/ Gen/ Rectal- Not done, not indicated Extrem- cyanosis- none, clubbing, none, atrophy- none, strength- nl Neuro- grossly intact to observation

## 2020-05-18 LAB — ANGIOTENSIN CONVERTING ENZYME: Angiotensin-Converting Enzyme: 39 U/L (ref 9–67)

## 2020-05-19 NOTE — Progress Notes (Signed)
Spoke with pt and notified of results per Dr. Young Pt verbalized understanding and denied any questions. 

## 2020-05-20 NOTE — Assessment & Plan Note (Signed)
Being eval by PCP. Doubt sarcoid role if no longer active. Plan - recheck ACE level

## 2020-05-20 NOTE — Assessment & Plan Note (Signed)
Likely burned out, but might have some connection to nodules in R eyelid, and to hypercalcemia. Plan- ACE level, CXR

## 2020-05-20 NOTE — Assessment & Plan Note (Signed)
Tells me she is not smoking. Apparently told nurse she wasn't smoking "due to the heat". Plan- complete and permanent smoking cessation.

## 2020-06-02 ENCOUNTER — Ambulatory Visit: Payer: Medicare Other | Admitting: Skilled Nursing Facility1

## 2020-06-21 ENCOUNTER — Telehealth: Payer: Self-pay | Admitting: Internal Medicine

## 2020-06-21 NOTE — Telephone Encounter (Signed)
Initial call says chest pain and tightness ,  Last note from Dr. Wilburt Finlay burnt out sarcoid .  If she is not better with extra albuterol, will need office visit  If no openings will need to go to urgent care or ER as need further evaluation as could be related to non pulmonary issues , concern for cardiac as active smoker   Please contact office for sooner follow up if symptoms do not improve or worsen or seek emergency care

## 2020-06-21 NOTE — Telephone Encounter (Signed)
Patient directed to urgent care to evaluate chest pain. She is planning on going to urgent care right now. Once she is cleared of a cardiac problem she will call us back if she is not feeling better with the extra albuterol. Patient informed of heart attack risk and need for urgent care. She will call us back tomorrow to let us know how she is feeling.

## 2020-06-21 NOTE — Telephone Encounter (Signed)
Patient of Dr. Maple Hudson, history of asthma and sarcoidosis. Chief complaint is chest pain and tightness x 1 day, mild wheeze. No cough, fever, or other symptom reported. Patient states it feels like the sarcoidosis flare. She is also addressing poor circulation in her legs. She is using her symbicort and albuterol twice a day.. She has a home health nurse addressing her legs. Please advise.

## 2020-06-27 ENCOUNTER — Other Ambulatory Visit: Payer: Medicare Other

## 2020-07-27 ENCOUNTER — Other Ambulatory Visit: Payer: Self-pay

## 2020-07-27 DIAGNOSIS — J4520 Mild intermittent asthma, uncomplicated: Secondary | ICD-10-CM | POA: Insufficient documentation

## 2020-07-27 DIAGNOSIS — I739 Peripheral vascular disease, unspecified: Secondary | ICD-10-CM

## 2020-08-03 ENCOUNTER — Ambulatory Visit (HOSPITAL_COMMUNITY)
Admission: RE | Admit: 2020-08-03 | Discharge: 2020-08-03 | Disposition: A | Discharge status: Home / Self Care | Payer: Medicare Other | Source: Ambulatory Visit | Attending: Vascular Surgery | Admitting: Vascular Surgery

## 2020-08-03 ENCOUNTER — Other Ambulatory Visit: Payer: Self-pay

## 2020-08-03 ENCOUNTER — Encounter: Payer: Self-pay | Admitting: Vascular Surgery

## 2020-08-03 ENCOUNTER — Ambulatory Visit (INDEPENDENT_AMBULATORY_CARE_PROVIDER_SITE_OTHER): Payer: Medicare Other | Admitting: Vascular Surgery

## 2020-08-03 VITALS — BP 116/76 | HR 72 | Temp 97.5°F | Resp 20 | Ht 64.0 in | Wt 141.0 lb

## 2020-08-03 DIAGNOSIS — I739 Peripheral vascular disease, unspecified: Secondary | ICD-10-CM

## 2020-08-03 NOTE — Progress Notes (Signed)
Patient ID: Stephanie Stephens, female   DOB: 07/23/1966, 54 y.o.   MRN: [id number]  Reason for Consult: New Patient (Initial Visit)   Referred by Wilfrid Lund, PA  Subjective:     HPI:  Stephanie Stephens is a 54 y.o. female with history of diabetes and long history of smoking currently only smokes intermittently.  Has bilateral lower extremity leg pain typically aching in nature occurs with walking and with rest.  Does not have tissue loss or ulceration.  She has never had any vascular invention.  Denies any history of DVT.  Denies history of stroke, TIA or amaurosis.  No family history of aneurysm.  Past Medical History:  Diagnosis Date  . Anemia   . Asthma   . Colon polyps   . Depression   . Diverticulosis   . Genital herpes   . Hx of adenomatous colonic polyps   . Pancreatitis   . Sarcoidosis   . Umbilical hernia    Family History  Problem Relation Age of Onset  . Ovarian cancer Mother   . Colon polyps Mother   . Breast cancer Sister   . Colon cancer Father   . Stomach cancer Neg Hx   . Esophageal cancer Neg Hx    Past Surgical History:  Procedure Laterality Date  . CESAREAN SECTION     x 3  . CHOLECYSTECTOMY     lap  . HERNIA REPAIR     x 4  . PARTIAL HYSTERECTOMY    . TONSILLECTOMY      Short Social History:  Social History   Tobacco Use  . Smoking status: Light Tobacco Smoker    Packs/day: 0.25    Types: Cigarettes  . Smokeless tobacco: Never Used  Substance Use Topics  . Alcohol use: Yes    Alcohol/week: 0.0 standard drinks    Comment: daily the last month    Allergies  Allergen Reactions  . Bee Venom Anaphylaxis  . Mango Flavor Anaphylaxis  . Ciprofloxacin Swelling  . Latex Rash  . Penicillins Rash    Has patient had a PCN reaction causing immediate rash, facial/tongue/throat swelling, SOB or lightheadedness with hypotension: yes Has patient had a PCN reaction causing severe rash involving mucus membranes or skin necrosis: No Has patient  had a PCN reaction that required hospitalization No Has patient had a PCN reaction occurring within the last 10 years: No If all of the above answers are "NO", then may proceed with Cephalosporin use.    Current Outpatient Medications  Medication Sig Dispense Refill  . albuterol (PROVENTIL) (2.5 MG/3ML) 0.083% nebulizer solution USE 1 VIAL IN NEBULIZER 4 TIMES DAILY. Generic: VENTOLIN 150 mL 12  . budesonide-formoterol (SYMBICORT) 160-4.5 MCG/ACT inhaler inhale 2 puffs by mouth FIRST THING IN THE MORNING then 2 puffs 12 HOURS LATER 10.2 g 6  . dicyclomine (BENTYL) 10 MG capsule Take 1 capsule (10 mg total) by mouth every 8 (eight) hours as needed for spasms. 30 capsule 1  . diphenhydrAMINE (BENADRYL) 25 MG tablet Take 25 mg by mouth daily.     Marland Kitchen docusate sodium (COLACE) 100 MG capsule Take 1 capsule (100 mg total) by mouth every 12 (twelve) hours. 60 capsule 0  . famotidine (PEPCID) 20 MG tablet Take 1 tablet (20 mg total) by mouth 2 (two) times daily. 30 tablet 0  . HYDROcodone-acetaminophen (NORCO/VICODIN) 5-325 MG tablet Take 1 tablet by mouth every 6 (six) hours as needed for severe pain. 10 tablet 0  .  meloxicam (MOBIC) 7.5 MG tablet Take 1 tablet (7.5 mg total) by mouth daily. 15 tablet 0  . methylcellulose (CITRUCEL) oral powder Use daily as directed    . Multiple Vitamin (MULTIVITAMIN WITH MINERALS) TABS Take 1 tablet by mouth daily.    . naproxen (NAPROSYN) 500 MG tablet Take 1 tablet (500 mg total) by mouth 2 (two) times daily. 20 tablet 0  . omeprazole (PRILOSEC) 40 MG capsule Take 1 capsule (40 mg total) by mouth daily. 30 capsule 3  . PROAIR HFA 108 (90 Base) MCG/ACT inhaler Inhale 2 puffs every 4 hours if needed for breathing 8 g 6  . traMADol (ULTRAM) 50 MG tablet Take 1 tablet (50 mg total) by mouth every 12 (twelve) hours as needed for severe pain. 10 tablet 0  . Vitamins/Minerals TABS Take by mouth.     Current Facility-Administered Medications  Medication Dose Route  Frequency Provider Last Rate Last Admin  . 0.9 %  sodium chloride infusion  500 mL Intravenous Once Armbruster, Willaim Rayas, MD        Review of Systems  Constitutional:  Constitutional negative. HENT: HENT negative.  Eyes: Eyes negative.  Respiratory: Respiratory negative.  Cardiovascular: Cardiovascular negative.  GI: Gastrointestinal negative.  Musculoskeletal: Positive for gait problem and leg pain.  Hematologic: Hematologic/lymphatic negative.  Psychiatric: Psychiatric negative.        Objective:  Objective   Vitals:   08/03/20 1108  BP: 116/76  Pulse: 72  Resp: 20  Temp: (!) 97.5 F (36.4 C)  SpO2: 98%  Weight: 141 lb (64 kg)  Height: 5\' 4"  (1.626 m)   Body mass index is 24.2 kg/m.  Physical Exam HENT:     Head: Normocephalic.     Nose:     Comments: Wearing a mask Eyes:     Pupils: Pupils are equal, round, and reactive to light.  Neck:     Vascular: No carotid bruit.  Cardiovascular:     Rate and Rhythm: Normal rate.     Pulses: Normal pulses.  Pulmonary:     Effort: Pulmonary effort is normal.     Breath sounds: Normal breath sounds.  Abdominal:     General: Abdomen is flat.     Palpations: Abdomen is soft.  Musculoskeletal:        General: No swelling. Normal range of motion.  Skin:    General: Skin is warm and dry.     Capillary Refill: Capillary refill takes less than 2 seconds.  Neurological:     General: No focal deficit present.     Mental Status: She is alert.  Psychiatric:        Mood and Affect: Mood normal.        Behavior: Behavior normal.        Thought Content: Thought content normal.        Judgment: Judgment normal.     Data: I have independently interpreted her ABIs to be triphasic greater than 1 bilaterally.  Toe pressure on the right 102 on the left 86     Assessment/Plan:     54 year old female with diabetes and long smoking history with leg pain.  ABIs are normal she has palpable pulses.  She can follow-up with me on  an as-needed basis.  We did discuss smoking cessation at length she plans to quit by the end of this month.     40 MD [facility]

## 2020-08-09 ENCOUNTER — Encounter: Payer: Medicare Other | Admitting: Vascular Surgery

## 2020-08-09 ENCOUNTER — Encounter (HOSPITAL_COMMUNITY): Payer: Medicare Other

## 2020-08-16 ENCOUNTER — Telehealth: Payer: Self-pay | Admitting: Internal Medicine

## 2020-08-16 DIAGNOSIS — J4530 Mild persistent asthma, uncomplicated: Secondary | ICD-10-CM

## 2020-08-16 DIAGNOSIS — D869 Sarcoidosis, unspecified: Secondary | ICD-10-CM

## 2020-08-16 NOTE — Telephone Encounter (Signed)
Called and spoke with pt who stated that she is needing a new nebulizer machine as well as tubing, mouthpiece, and mask to be sent to [facility].  Dr. Maple Hudson, please advise if you are okay with this being sent for pt.

## 2020-08-16 NOTE — Telephone Encounter (Signed)
Yes please order replacement compressor nebulizer, hoses and supplies to [facility]

## 2020-08-17 NOTE — Telephone Encounter (Signed)
Called and let patient know the order was sent. And verified new address with patient since she moved.   Nothing further needed at this time.

## 2020-09-14 NOTE — Progress Notes (Signed)
HPI   Former smoker followed for Sarcoid, allergic rhinitis, allergic conjunctivitis angioedema,, complicated by GERD, cervical node,  ACE 05/2013- 56H Allergy profile 01/20/15- Neg, IgE 22 ACE level 01/20/15- stable at 53 ACE 05/16/20- 39 WNL ----------------------------------------------------------------------  05/16/20- 3 yoF  Former heavy smoker followed for Sarcoid, allergic rhinitis, allergic conjunctivitis, angioedema, complicated by GERD, cervical node, 1 year f/u Sacoidosis/Asthma Symbicort 160, neb albuterol, albuterol hfa, ------Sarcoidosis Not smoking currently. Declines Covax. Stuffy nose without headache or sinus infection. Cough stable, productive clear mucus. Being evaluated for high Calcium- discussed potential relation to sarcoid.   09/15/20-  75 yoF  Former heavy smoker followed for Sarcoid, allergic rhinitis, allergic conjunctivitis, angioedema, complicated by GERD, cervical node, Symbicort 160, neb albuterol, albuterol hfa, DM2, ACE 05/16/20- 39 WNL -----pt has clear mucus and hot flashes are intense now Smoking again 1/3 PPD and knows to quit. Blames 2 deaths in family. Some seasonal increase in clear drainage but not sick . Meds are ok.  Asked about "rash"- subtle hyperpigmentation in R midaxillary line, no red, excoriation or other changes, present at least several months and stable.  Newly dx'd DM 2 not insulin dependent. Scheduled for Moderna vax today and in 3 weeks. Plans flu vax in between. CXR 05/16/20- IMPRESSION: 1. Stable upper lobe predominant parenchymal lung scarring. 2. No evidence of lymphadenopathy. 3. No acute intrathoracic process.   ROS-see HPI   + = positive Constitutional:    weight loss, night sweats, fevers, chills, fatigue, lassitude. HEENT:    headaches, difficulty swallowing, tooth/dental problems, sore throat,       sneezing, itching, ear ache, + nasal congestion, post nasal drip, snoring CV:    chest pain, orthopnea, PND, swelling in  lower extremities, anasarca,                                                     dizziness, palpitations Resp:   shortness of breath with exertion or at rest.                + productive cough,   non-productive cough, coughing up of blood.              change in color of mucus.  wheezing.   Skin:    rash or lesions. GI:  No-   heartburn, indigestion, abdominal pain, nausea, vomiting, diarrhea,                 change in bowel habits, loss of appetite GU: dysuria, change in color of urine, no urgency or frequency.   flank pain. MS:   joint pain, stiffness, decreased range of motion, back pain. Neuro-     nothing unusual Psych:  change in mood or affect.  depression or anxiety.   memory loss.  OBJ- Physical Exam General- Alert, Oriented, Affect-+tearful, Distress- none acute Skin- +rubbery nodules in skin of upper R eyelid Lymphadenopathy- none Head- atraumatic            Eyes- Gross vision intact, PERRLA, conjunctivae and secretions clear            Ears- Hearing, canals-normal            Nose-  turbinate edema, no-Septal dev, mucus, polyps- none seen, erosion, perforation             Throat- Mallampati II , mucosa clear ,  drainage- none, tonsils- atrophic Neck- flexible , trachea midline, no stridor , thyroid nl, carotid no bruit Chest - symmetrical excursion , unlabored           Heart/CV- RRR , no murmur , no gallop  , no rub, nl s1 s2                           - JVD- none , edema- none, stasis changes- none, varices- none           Lung- clear to P&A, wheeze- none, cough- none , dullness-none, rub- none           Chest wall-  Abd-  Br/ Gen/ Rectal- Not done, not indicated Extrem- cyanosis- none, clubbing, none, atrophy- none, strength- nl Neuro- grossly intact to observation

## 2020-09-15 ENCOUNTER — Encounter: Payer: Self-pay | Admitting: Internal Medicine

## 2020-09-15 ENCOUNTER — Other Ambulatory Visit: Payer: Self-pay

## 2020-09-15 ENCOUNTER — Ambulatory Visit (INDEPENDENT_AMBULATORY_CARE_PROVIDER_SITE_OTHER): Payer: Medicare Other | Admitting: Internal Medicine

## 2020-09-15 DIAGNOSIS — D869 Sarcoidosis, unspecified: Secondary | ICD-10-CM

## 2020-09-15 DIAGNOSIS — J4520 Mild intermittent asthma, uncomplicated: Secondary | ICD-10-CM

## 2020-09-15 DIAGNOSIS — Z720 Tobacco use: Secondary | ICD-10-CM

## 2020-09-15 MED ORDER — ALBUTEROL SULFATE (2.5 MG/3ML) 0.083% IN NEBU: INHALATION_SOLUTION | RESPIRATORY_TRACT | 12 refills | Status: DC

## 2020-09-15 NOTE — Assessment & Plan Note (Signed)
Currently uncomplicated with some rhinorhea in recent Fall weather Plan- continue meds

## 2020-09-15 NOTE — Assessment & Plan Note (Signed)
Emphasized importance of quitting now. She is setting quit date end of month.

## 2020-09-15 NOTE — Patient Instructions (Signed)
Order- schedule return for standard flu vax  I do recommend you go ahead and get your Covid vaccine today as discussed  Please call if I can help

## 2020-09-15 NOTE — Assessment & Plan Note (Signed)
Clinically inactive. Stable CXR discussed. Plan- observation

## 2020-11-15 ENCOUNTER — Other Ambulatory Visit: Payer: Self-pay | Admitting: Physician Assistant

## 2020-11-15 ENCOUNTER — Ambulatory Visit
Admission: RE | Admit: 2020-11-15 | Discharge: 2020-11-15 | Disposition: A | Discharge status: Home / Self Care | Payer: Medicare Other | Source: Ambulatory Visit | Attending: Physician Assistant | Admitting: Physician Assistant

## 2020-11-15 ENCOUNTER — Other Ambulatory Visit: Payer: Self-pay

## 2020-11-15 DIAGNOSIS — R310 Gross hematuria: Secondary | ICD-10-CM

## 2021-02-28 ENCOUNTER — Other Ambulatory Visit: Payer: Self-pay

## 2021-02-28 ENCOUNTER — Ambulatory Visit (INDEPENDENT_AMBULATORY_CARE_PROVIDER_SITE_OTHER): Payer: 59 | Admitting: Podiatry

## 2021-02-28 ENCOUNTER — Encounter: Payer: Self-pay | Admitting: Podiatry

## 2021-02-28 DIAGNOSIS — M722 Plantar fascial fibromatosis: Secondary | ICD-10-CM

## 2021-02-28 DIAGNOSIS — E210 Primary hyperparathyroidism: Secondary | ICD-10-CM | POA: Insufficient documentation

## 2021-02-28 DIAGNOSIS — G5751 Tarsal tunnel syndrome, right lower limb: Secondary | ICD-10-CM

## 2021-02-28 DIAGNOSIS — F17200 Nicotine dependence, unspecified, uncomplicated: Secondary | ICD-10-CM | POA: Insufficient documentation

## 2021-02-28 DIAGNOSIS — K1120 Sialoadenitis, unspecified: Secondary | ICD-10-CM | POA: Insufficient documentation

## 2021-03-01 ENCOUNTER — Encounter: Payer: Self-pay | Admitting: Podiatry

## 2021-03-01 NOTE — Progress Notes (Signed)
Subjective:  Patient ID: Stephanie Stephens, female    DOB: [birth year],  MRN: [id number]  Chief Complaint  Patient presents with  . Foot Pain  . Ingrown Toenail    Patient presents today for bilat forefoot pain, numbness, tingling x 1 month and possible ingrown toenail left hallux medial border x 2-3 days     55 y.o. female presents with the above complaint.  Patient presents with complaint of numbness tingling primarily to the right foot.  Patient states that it is causing her to go numb on the bottom of the foot.  It seems to primarily affecting the bottom of the foot.  She has not seen anyone else prior to seeing me.  She also has left ingrown nail border of the hallux that seems to be occasionally painful.  She wanted to get that evaluated as well.  She does not have any infection secondary to ingrown.  She denies any other acute complaints.   Review of Systems: Negative except as noted in the HPI. Denies N/V/F/Ch.  Past Medical History:  Diagnosis Date  . Anemia   . Asthma   . Colon polyps   . Depression   . Diverticulosis   . Genital herpes   . Hx of adenomatous colonic polyps   . Pancreatitis   . Sarcoidosis   . Umbilical hernia     Current Outpatient Medications:  .  meloxicam (MOBIC) 15 MG tablet, 1 tablet, Disp: , Rfl:  .  albuterol (PROVENTIL) (2.5 MG/3ML) 0.083% nebulizer solution, USE 1 VIAL IN NEBULIZER 4 TIMES DAILY. Generic: VENTOLIN, Disp: 150 mL, Rfl: 12 .  budesonide-formoterol (SYMBICORT) 160-4.5 MCG/ACT inhaler, inhale 2 puffs by mouth FIRST THING IN THE MORNING then 2 puffs 12 HOURS LATER, Disp: 10.2 g, Rfl: 6 .  diphenhydrAMINE (BENADRYL) 25 MG tablet, Take 25 mg by mouth daily. , Disp: , Rfl:  .  Multiple Vitamin (MULTIVITAMIN WITH MINERALS) TABS, Take 1 tablet by mouth daily., Disp: , Rfl:  .  PROAIR HFA 108 (90 Base) MCG/ACT inhaler, Inhale 2 puffs every 4 hours if needed for breathing, Disp: 8 g, Rfl: 6  Current Facility-Administered Medications:  .   0.9 %  sodium chloride infusion, 500 mL, Intravenous, Once, Armbruster, Willaim Rayas, MD  Social History   Tobacco Use  Smoking Status Light Tobacco Smoker  . Packs/day: 0.25  . Types: Cigarettes  Smokeless Tobacco Never Used    Allergies  Allergen Reactions  . Bee Venom Anaphylaxis  . Mango Flavor Anaphylaxis  . Ciprofloxacin Swelling  . Latex Rash  . Penicillins Rash    Has patient had a PCN reaction causing immediate rash, facial/tongue/throat swelling, SOB or lightheadedness with hypotension: yes Has patient had a PCN reaction causing severe rash involving mucus membranes or skin necrosis: No Has patient had a PCN reaction that required hospitalization No Has patient had a PCN reaction occurring within the last 10 years: No If all of the above answers are "NO", then may proceed with Cephalosporin use.   Objective:  There were no vitals filed for this visit. There is no height or weight on file to calculate BMI. Constitutional Well developed. Well nourished.  Vascular Dorsalis pedis pulses palpable bilaterally. Posterior tibial pulses palpable bilaterally. Capillary refill normal to all digits.  No cyanosis or clubbing noted. Pedal hair growth normal.  Neurologic Normal speech. Oriented to person, place, and time. Epicritic sensation to light touch grossly present bilaterally.  Dermatologic Nails well groomed and normal in appearance. No open wounds.  No skin lesions.  Orthopedic:  Positive Tinel's sign noted at the porta pedis on the medial side of the foot.  Gait examination shows pes planovalgus foot structure with pressure against the medial aspect of the foot.  To many toe signs calcaneal valgus noted.   Radiographs: None Assessment:   1. Plantar fasciitis    Plan:  Patient was evaluated and treated and all questions answered.  Right foot tarsal tunnel syndrome -I explained to the patient the etiology of tarsal tunnel syndrome and various treatment options were  discussed.  Clinically I am appreciative tarsal tunnel syndrome as her foot does have numbness and tingling consistent with positive Tinel's sign upon percussion of the porta pedis.  I discussed with her that she would benefit from a diagnostic block to properly assess if it is in fact tarsal tunnel syndrome.  Patient would like to hold off on this for now.  At this time she would just wants to discuss shoe gear changes and see if that is enough.  If there is no improvement we will discuss EMG study as well as a possible nerve block.  She states understanding  No follow-ups on file.

## 2021-04-10 ENCOUNTER — Telehealth: Payer: Self-pay | Admitting: Internal Medicine

## 2021-04-10 ENCOUNTER — Telehealth: Payer: Self-pay

## 2021-04-10 ENCOUNTER — Other Ambulatory Visit: Payer: Self-pay | Admitting: Internal Medicine

## 2021-04-10 MED ORDER — ALBUTEROL SULFATE HFA 108 (90 BASE) MCG/ACT IN AERS: INHALATION_SPRAY | RESPIRATORY_TRACT | 3 refills | Status: DC

## 2021-04-10 MED ORDER — BUDESONIDE-FORMOTEROL FUMARATE 160-4.5 MCG/ACT IN AERO: INHALATION_SPRAY | RESPIRATORY_TRACT | 3 refills | Status: DC

## 2021-04-10 NOTE — Telephone Encounter (Signed)
Rx for pt's symbicort inhaler and rescue inhaler have been sent to preferred pharmacy for pt as a 81-month supply per her request. Called and spoke with pt letting her know this had been done and she verbalized understanding. Nothing further needed.

## 2021-04-10 NOTE — Telephone Encounter (Signed)
Created in error

## 2021-06-13 ENCOUNTER — Other Ambulatory Visit: Payer: Self-pay | Admitting: Family Medicine

## 2021-06-13 DIAGNOSIS — Z1231 Encounter for screening mammogram for malignant neoplasm of breast: Secondary | ICD-10-CM

## 2021-06-23 ENCOUNTER — Inpatient Hospital Stay: Admission: RE | Admit: 2021-06-23 | Payer: Medicare Other | Source: Ambulatory Visit

## 2021-06-24 ENCOUNTER — Other Ambulatory Visit: Payer: Self-pay

## 2021-06-24 ENCOUNTER — Ambulatory Visit
Admission: RE | Admit: 2021-06-24 | Discharge: 2021-06-24 | Disposition: A | Discharge status: Home / Self Care | Payer: 59 | Source: Ambulatory Visit | Attending: Family Medicine | Admitting: Family Medicine

## 2021-06-24 DIAGNOSIS — Z1231 Encounter for screening mammogram for malignant neoplasm of breast: Secondary | ICD-10-CM

## 2021-08-14 ENCOUNTER — Other Ambulatory Visit: Payer: Self-pay

## 2021-08-14 ENCOUNTER — Encounter: Payer: Self-pay | Admitting: Family Medicine

## 2021-08-14 ENCOUNTER — Ambulatory Visit: Payer: Self-pay | Admitting: Family Medicine

## 2021-08-14 DIAGNOSIS — Z113 Encounter for screening for infections with a predominantly sexual mode of transmission: Secondary | ICD-10-CM

## 2021-08-14 LAB — HEPATITIS B SURFACE ANTIGEN

## 2021-08-14 LAB — HM HIV SCREENING LAB: HM HIV Screening: NEGATIVE

## 2021-08-14 LAB — WET PREP FOR TRICH, YEAST, CLUE
Trichomonas Exam: NEGATIVE
Yeast Exam: NEGATIVE

## 2021-08-14 LAB — HM HEPATITIS C SCREENING LAB: HM Hepatitis Screen: NEGATIVE

## 2021-08-14 NOTE — Progress Notes (Signed)
[facility] Department STI clinic/screening visit  Subjective:  Stephanie Stephens is a 55 y.o. female being seen today for an STI screening visit. The patient reports they do not have symptoms.  Patient reports that they do not desire a pregnancy in the next year.   They reported they are not interested in discussing contraception today.  No LMP recorded. Patient has had a hysterectomy.   Patient has the following medical conditions:   Patient Active Problem List   Diagnosis Date Noted   Parotitis 02/28/2021   Primary hyperparathyroidism (HCC) 02/28/2021   Tobacco dependence 02/28/2021   Asthma, mild intermittent 07/27/2020   Hypercalcemia 05/20/2020   Prediabetes 05/06/2020   Tobacco user 09/29/2019   Allergic rhinitis due to pollen 10/28/2018   Halitosis 10/28/2018   Lesion of pharynx 10/28/2018   Closed fracture of great toe 07/23/2018   Pain of toe of left foot 07/23/2018   Food allergy 04/15/2017   Anxiety 02/15/2015   Depression 02/15/2015   CAP (community acquired pneumonia) 10/02/2013   GERD 09/04/2010   PHARYNGITIS, ACUTE 03/15/2009   BRONCHITIS 08/24/2008   ALLERGIC CONJUNCTIVITIS 01/27/2008   PULMONARY SARCOIDOSIS 10/03/2007   Allergic conjunctivitis and rhinitis 10/03/2007   Asthma, mild persistent 10/03/2007    Chief Complaint  Patient presents with   SEXUALLY TRANSMITTED DISEASE    Screening    HPI  Patient reports here for screening, denies s/sx, just want to be check.    Last HIV test per patient/review of record was a couple of years ago.  Patient reports last pap was ~ 3-4 years ago.   See flowsheet for further details and programmatic requirements.    The following portions of the patient's history were reviewed and updated as appropriate: allergies, current medications, past medical history, past social history, past surgical history and problem list.  Objective:  There were no vitals filed for this visit.  Physical Exam Vitals  and nursing note reviewed.  Constitutional:      Appearance: Normal appearance.  HENT:     Head: Normocephalic and atraumatic.     Mouth/Throat:     Mouth: Mucous membranes are moist.     Pharynx: Oropharynx is clear. No oropharyngeal exudate or posterior oropharyngeal erythema.  Pulmonary:     Effort: Pulmonary effort is normal.  Abdominal:     General: Abdomen is flat.     Palpations: There is no mass.     Tenderness: There is no abdominal tenderness. There is no rebound.  Genitourinary:    Exam position: Lithotomy position.     Pubic Area: No rash or pubic lice.      Labia:        Right: No rash or lesion.        Left: No rash or lesion.      Vagina: Normal. No vaginal discharge, erythema, bleeding or lesions.     Cervix: No cervical motion tenderness, discharge, friability, lesion or erythema.     Uterus: Normal.      Adnexa: Right adnexa normal and left adnexa normal.     Comments: Deferred, pt self collected  Musculoskeletal:     Cervical back: Normal range of motion and neck supple.  Lymphadenopathy:     Head:     Right side of head: No preauricular or posterior auricular adenopathy.     Left side of head: No preauricular or posterior auricular adenopathy.     Cervical: No cervical adenopathy.     Upper Body:  Right upper body: No supraclavicular or axillary adenopathy.     Left upper body: No supraclavicular or axillary adenopathy.     Lower Body: No right inguinal adenopathy. No left inguinal adenopathy.  Skin:    General: Skin is warm and dry.     Findings: No rash.  Neurological:     Mental Status: She is alert and oriented to person, place, and time.  Psychiatric:        Mood and Affect: Mood normal.        Behavior: Behavior normal.     Assessment and Plan:  Stephanie Stephens is a 55 y.o. female presenting to the [facility] Department for STI screening  1. Screening examination for venereal disease Patient accepted all screenings including  wet prep,  vaginal CT/GC and bloodwork for HIV/RPR.  Patient meets criteria for HepB screening? Yes. Ordered? Yes Patient meets criteria for HepC screening? Yes. Ordered? Yes  Wet prep results neg     No Treatment needed  Discussed time line for [facility] Lab results and that patient will be called with positive results and encouraged patient to call if she had not heard in 2 weeks.  Counseled to return or seek care for continued or worsening symptoms Recommended condom use with all sex  Patient is currently using  hys  to prevent pregnancy.   - Chlamydia/Gonorrhea [place] [facility] - HIV/HCV [place] [facility] - Syphilis Serology,  Lab - WET PREP FOR TRICH, YEAST, CLUE - HBV Antigen/Antibody [facility]     Return for as needed.  Future Appointments  Date Time Provider Department Center  09/19/2021 10:00 AM Waymon Budge, MD [facility] None    Wendi Snipes, FNP

## 2021-08-14 NOTE — Progress Notes (Signed)
Pt here for STD screening.  Wet mount results reviewed, no treatment required per SO.  Condoms given.  Kimm Ungaro M Osiel Stick, RN  

## 2021-09-11 IMAGING — DX DG CHEST 2V
2 series · 2 of 2 positions shown · non-contrast
Comparison: 09/29/2019

CLINICAL DATA: Sarcoidosis

EXAM:
CHEST - 2 VIEW

[chest pa]
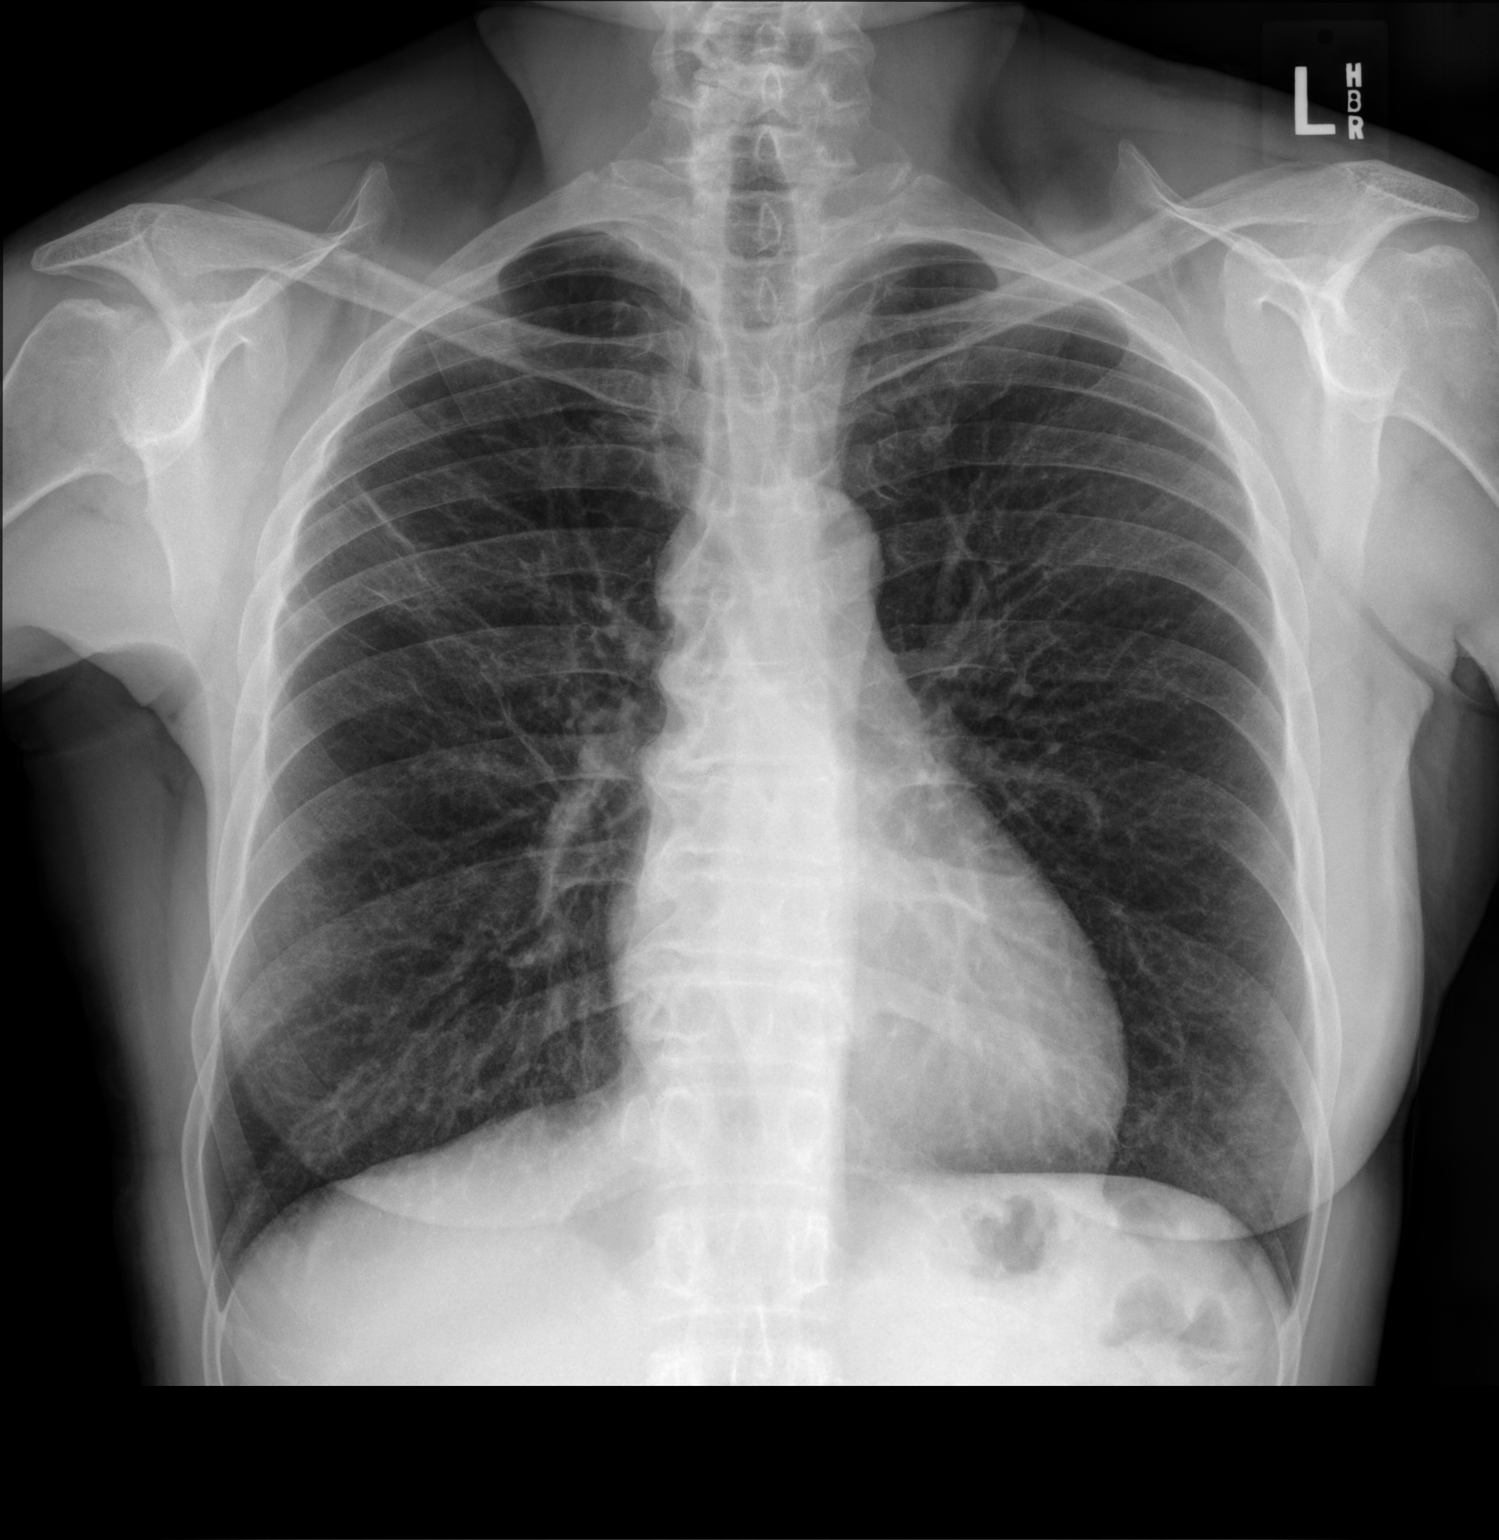

[chest lat]
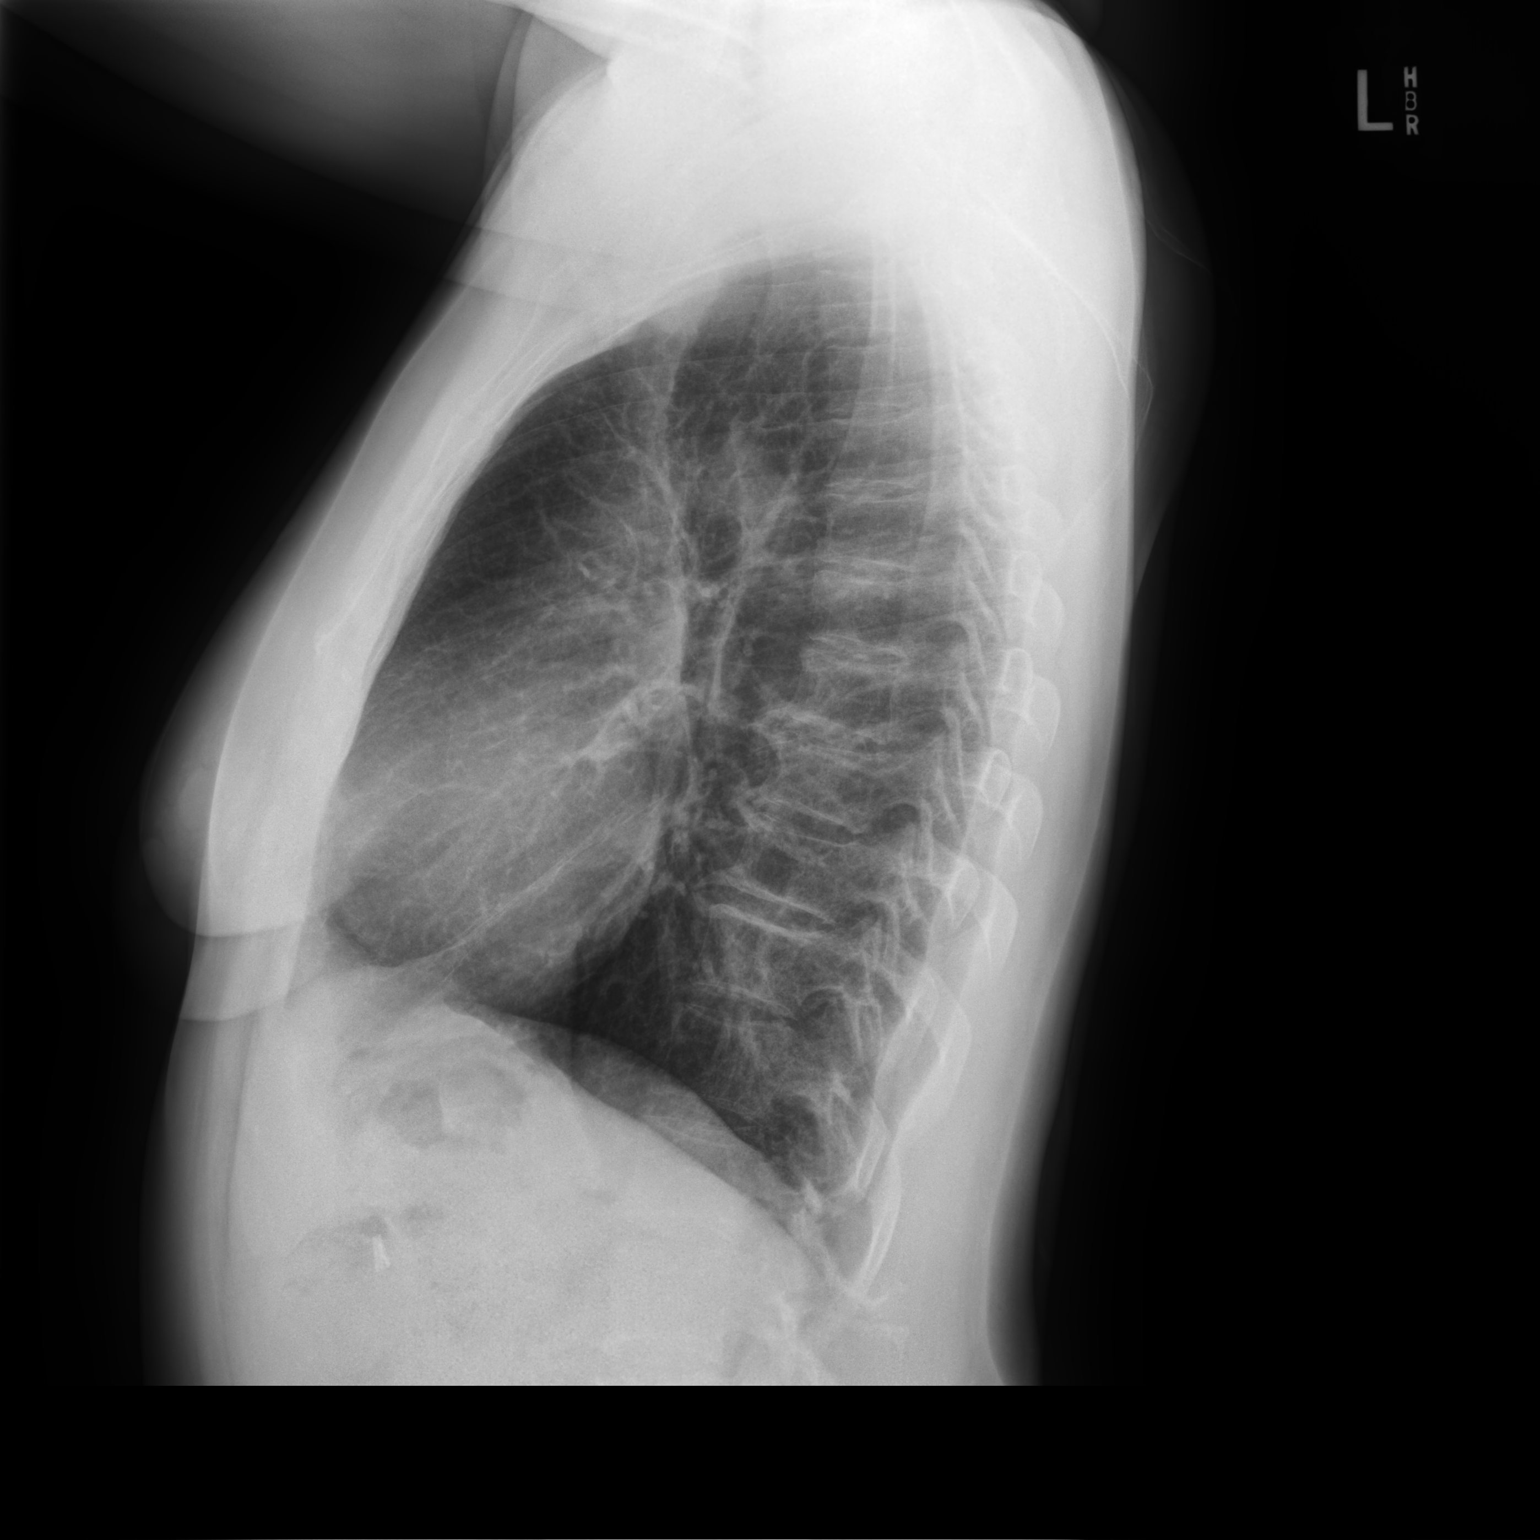

[2 of 2 positions shown; findings below may reference images not displayed]

FINDINGS: Frontal and lateral views of the chest demonstrate an unremarkable
cardiac silhouette. No evidence of mediastinal or hilar
lymphadenopathy. Chronic areas of scarring are seen throughout the
lungs, upper lobe predominant. No airspace disease, effusion, or
pneumothorax.
IMPRESSION: 1. Stable upper lobe predominant parenchymal lung scarring.
2. No evidence of lymphadenopathy.
3. No acute intrathoracic process.

## 2021-09-19 ENCOUNTER — Ambulatory Visit (INDEPENDENT_AMBULATORY_CARE_PROVIDER_SITE_OTHER): Payer: 59 | Admitting: Internal Medicine

## 2021-09-19 ENCOUNTER — Other Ambulatory Visit: Payer: Self-pay

## 2021-09-19 ENCOUNTER — Encounter: Payer: Self-pay | Admitting: Internal Medicine

## 2021-09-19 ENCOUNTER — Ambulatory Visit (INDEPENDENT_AMBULATORY_CARE_PROVIDER_SITE_OTHER): Payer: 59

## 2021-09-19 VITALS — BP 128/62 | HR 77 | Temp 98.0°F | Ht 64.0 in | Wt 147.4 lb

## 2021-09-19 DIAGNOSIS — Z23 Encounter for immunization: Secondary | ICD-10-CM | POA: Diagnosis not present

## 2021-09-19 DIAGNOSIS — J4520 Mild intermittent asthma, uncomplicated: Secondary | ICD-10-CM

## 2021-09-19 DIAGNOSIS — D869 Sarcoidosis, unspecified: Secondary | ICD-10-CM | POA: Diagnosis not present

## 2021-09-19 DIAGNOSIS — Z720 Tobacco use: Secondary | ICD-10-CM

## 2021-09-19 DIAGNOSIS — F3289 Other specified depressive episodes: Secondary | ICD-10-CM

## 2021-09-19 MED ORDER — ALBUTEROL SULFATE HFA 108 (90 BASE) MCG/ACT IN AERS: INHALATION_SPRAY | RESPIRATORY_TRACT | 12 refills | Status: DC

## 2021-09-19 MED ORDER — ALBUTEROL SULFATE (2.5 MG/3ML) 0.083% IN NEBU
INHALATION_SOLUTION | RESPIRATORY_TRACT | 12 refills | Status: DC
Start: 2021-09-19 — End: 2023-04-04

## 2021-09-19 MED ORDER — BUDESONIDE-FORMOTEROL FUMARATE 160-4.5 MCG/ACT IN AERO
INHALATION_SPRAY | RESPIRATORY_TRACT | 12 refills | Status: DC
Start: 2021-09-19 — End: 2022-08-13

## 2021-09-19 NOTE — Patient Instructions (Addendum)
Order- CXR  dx Sarcoid  Order- Flu vax- standard  Refills sent for albuterol rescue inhaler, Symbicort and nebulizer solution

## 2021-09-19 NOTE — Progress Notes (Signed)
HPI   Former smoker followed for Sarcoid, allergic rhinitis, allergic conjunctivitis angioedema,, complicated by GERD, cervical node,  ACE 05/2013- 56H Allergy profile 01/20/15- Neg, IgE 22 ACE level 01/20/15- stable at 53 ACE 05/16/20- 39 WNL ----------------------------------------------------------------------   09/15/20-  54 yoF  Former heavy smoker followed for Sarcoid, allergic rhinitis, allergic conjunctivitis, angioedema, complicated by GERD, cervical node, Symbicort 160, neb albuterol, albuterol hfa, DM2, ACE 05/16/20- 39 WNL -----pt has clear mucus and hot flashes are intense now Smoking again 1/3 PPD and knows to quit. Blames 2 deaths in family. Some seasonal increase in clear drainage but not sick . Meds are ok.  Asked about "rash"- subtle hyperpigmentation in R midaxillary line, no red, excoriation or other changes, present at least several months and stable.  Newly dx'd DM 2 not insulin dependent. Scheduled for Moderna vax today and in 3 weeks. Plans flu vax in between. CXR 05/16/20- IMPRESSION: 1. Stable upper lobe predominant parenchymal lung scarring. 2. No evidence of lymphadenopathy. 3. No acute intrathoracic process.  09/19/21-55 yoF  Former heavy / occasional smoker followed for Sarcoid, allergic rhinitis, allergic conjunctivitis, angioedema, complicated by GERD, cervical node, -Symbicort 160, neb albuterol, albuterol hfa, DM2, Covid vax- Flu vax-  -----C/o sob increased and wheezing 1 mth. Ago, feeling  some better now ACT score 12 No infection. Much stress with 4 deaths this year among family. She can't afford a car   a job. Needs meds refilled. Smoking some. Discussed. Cannot find work and cannot afford a car to get her self to work.  Depends on friends or family to get her to necessary errands.  Fortunately has avoided major illness and able to get her inhalers.  Needs refill nebulizer solution and rescue inhaler. Sarcoid is unlikely to reactivate but potential  concerns her.  ROS-see HPI   + = positive Constitutional:    weight loss, night sweats, fevers, chills, fatigue, lassitude. HEENT:    headaches, difficulty swallowing, tooth/dental problems, sore throat,       sneezing, itching, ear ache, + nasal congestion, post nasal drip, snoring CV:    chest pain, orthopnea, PND, swelling in lower extremities, anasarca,                                                     dizziness, palpitations Resp:   shortness of breath with exertion or at rest.                + productive cough,   non-productive cough, coughing up of blood.              change in color of mucus.  wheezing.   Skin:    rash or lesions. GI:  No-   heartburn, indigestion, abdominal pain, nausea, vomiting, diarrhea,                 change in bowel habits, loss of appetite GU: dysuria, change in color of urine, no urgency or frequency.   flank pain. MS:   joint pain, stiffness, decreased range of motion, back pain. Neuro-     nothing unusual Psych:  change in mood or affect.  depression or anxiety.   memory loss.  OBJ- Physical Exam General- Alert, Oriented, Affect-+tearful, Distress- none acute Skin- +rubbery nodules in skin of upper R eyelid Lymphadenopathy- none Head- atraumatic  Eyes- Gross vision intact, PERRLA, conjunctivae and secretions clear            Ears- Hearing, canals-normal            Nose-  turbinate edema, no-Septal dev, mucus, polyps- none seen, erosion, perforation             Throat- Mallampati II , mucosa clear , drainage- none, tonsils- atrophic Neck- flexible , trachea midline, no stridor , thyroid nl, carotid no bruit Chest - symmetrical excursion , unlabored           Heart/CV- RRR , no murmur , no gallop  , no rub, nl s1 s2                           - JVD- none , edema- none, stasis changes- none, varices- none           Lung- clear to P&A, wheeze- none, cough- none , dullness-none, rub- none           Chest wall-  Abd-  Br/ Gen/ Rectal- Not  done, not indicated Extrem- cyanosis- none, clubbing, none, atrophy- none, strength- nl Neuro- grossly intact to observation

## 2021-11-19 HISTORY — PX: CATARACT EXTRACTION, BILATERAL: SHX1313

## 2021-12-03 NOTE — Assessment & Plan Note (Signed)
Emphasized importance of smoking cessation for her health and for her pocketbook.

## 2021-12-03 NOTE — Assessment & Plan Note (Signed)
Situational anxiety and depression.  What she needs most now is a job.

## 2021-12-03 NOTE — Assessment & Plan Note (Signed)
Currently uncomplicated.  Medication choices and costs discussed. Plan-refill rescue inhaler and nebulizer solution.

## 2021-12-06 ENCOUNTER — Other Ambulatory Visit: Payer: Self-pay

## 2021-12-06 ENCOUNTER — Encounter: Payer: Self-pay | Admitting: Family Medicine

## 2021-12-06 ENCOUNTER — Ambulatory Visit: Payer: Self-pay | Admitting: Family Medicine

## 2021-12-06 DIAGNOSIS — Z792 Long term (current) use of antibiotics: Secondary | ICD-10-CM

## 2021-12-06 DIAGNOSIS — Z113 Encounter for screening for infections with a predominantly sexual mode of transmission: Secondary | ICD-10-CM

## 2021-12-06 LAB — WET PREP FOR TRICH, YEAST, CLUE
Trichomonas Exam: NEGATIVE
Yeast Exam: NEGATIVE

## 2021-12-06 MED ORDER — METRONIDAZOLE 500 MG PO TABS: 500.0000 mg | ORAL_TABLET | Freq: Two times a day (BID) | ORAL | 0 refills | Status: AC

## 2021-12-06 NOTE — Progress Notes (Signed)
[facility] Department  STI clinic/screening visit [address] [place] [address] [phone]  Subjective:  Stephanie Stephens is a 56 y.o. female being seen today for an STI screening visit. The patient reports they do have symptoms.  Patient reports that they do not desire a pregnancy in the next year.   They reported they are not interested in discussing contraception today.    No LMP recorded. Patient has had a hysterectomy.   Patient has the following medical conditions:   Patient Active Problem List   Diagnosis Date Noted   Parotitis 02/28/2021   Primary hyperparathyroidism (HCC) 02/28/2021   Tobacco dependence 02/28/2021   Asthma, mild intermittent 07/27/2020   Hypercalcemia 05/20/2020   Prediabetes 05/06/2020   Tobacco user 09/29/2019   Allergic rhinitis due to pollen 10/28/2018   Halitosis 10/28/2018   Lesion of pharynx 10/28/2018   Closed fracture of great toe 07/23/2018   Pain of toe of left foot 07/23/2018   Food allergy 04/15/2017   Anxiety 02/15/2015   Depression 02/15/2015   CAP (community acquired pneumonia) 10/02/2013   GERD 09/04/2010   PHARYNGITIS, ACUTE 03/15/2009   BRONCHITIS 08/24/2008   ALLERGIC CONJUNCTIVITIS 01/27/2008   PULMONARY SARCOIDOSIS 10/03/2007   Allergic conjunctivitis and rhinitis 10/03/2007   Asthma, mild persistent 10/03/2007    Chief Complaint  Patient presents with   SEXUALLY TRANSMITTED DISEASE    screening    HPI  Patient reports here for screening, pt has s/sx   Last HIV test per patient/review of record was 08/14/2021 Patient reports last pap was years ago.  Screening for MPX risk: Does the patient have an unexplained rash? No Is the patient MSM? No Does the patient endorse multiple sex partners or anonymous sex partners? No Did the patient have close or sexual contact with a person diagnosed with MPX? No Has the patient traveled outside the [place] where MPX is endemic? No Is there a high  clinical suspicion for MPX-- evidenced by one of the following No  -Unlikely to be chickenpox  -Lymphadenopathy  -Rash that present in same phase of evolution on any given body part See flowsheet for further details and programmatic requirements.    The following portions of the patient's history were reviewed and updated as appropriate: allergies, current medications, past medical history, past social history, past surgical history and problem list.  Objective:  There were no vitals filed for this visit.  Physical Exam Vitals and nursing note reviewed.  Constitutional:      Appearance: Normal appearance.  HENT:     Head: Normocephalic and atraumatic.     Mouth/Throat:     Mouth: Mucous membranes are moist.     Pharynx: Oropharynx is clear. No oropharyngeal exudate or posterior oropharyngeal erythema.  Pulmonary:     Effort: Pulmonary effort is normal.  Abdominal:     General: Abdomen is flat.     Palpations: There is no mass.     Tenderness: There is no abdominal tenderness. There is no rebound.  Genitourinary:    General: Normal vulva.     Exam position: Lithotomy position.     Pubic Area: No rash or pubic lice.      Labia:        Right: No rash or lesion.        Left: No rash or lesion.      Vagina: Normal. No vaginal discharge, erythema, bleeding or lesions.     Cervix: No cervical motion tenderness, discharge, friability,  lesion or erythema.     Uterus: Normal.      Adnexa: Right adnexa normal and left adnexa normal.     Rectum: Normal.     Comments: External genitalia without, lice, nits, erythema, edema , lesions or inguinal adenopathy. Vagina with normal mucosa and discharge and pH equals 4.  No Cervix, removed with hysterectomy.    Lymphadenopathy:     Head:     Right side of head: No preauricular or posterior auricular adenopathy.     Left side of head: No preauricular or posterior auricular adenopathy.     Cervical: No cervical adenopathy.     Upper Body:      Right upper body: No supraclavicular or axillary adenopathy.     Left upper body: No supraclavicular or axillary adenopathy.     Lower Body: No right inguinal adenopathy. No left inguinal adenopathy.  Skin:    General: Skin is warm and dry.     Findings: No rash.  Neurological:     Mental Status: She is alert and oriented to person, place, and time.     Assessment and Plan:  Stephanie Stephens is a 56 y.o. female presenting to the [facility] Department for STI screening  1. Screening examination for venereal disease Patient accepted all screenings including wet prep, vaginal CT/GC and bloodwork for HIV/RPR.  Patient meets criteria for HepB screening? Yes. Ordered? No - tested in last 6 months  Patient meets criteria for HepC screening? Yes. Ordered? No - tested in last 6 months   Wet prep results neg    Treatment needed for BV d/t s/sx  Discussed time line for State Lab results and that patient will be called with positive results and encouraged patient to call if she had not heard in 2 weeks.  Counseled to return or seek care for continued or worsening symptoms Recommended condom use with all sex  Patient is currently using  hysterectomy   to prevent pregnancy.   - Chlamydia/Gonorrhea [place] - HIV [place] [facility] - Syphilis Serology, [place] [facility] - WET PREP FOR TRICH, YEAST, CLUE  2. Prophylactic antibiotic - metroNIDAZOLE (FLAGYL) 500 MG tablet; Take 1 tablet (500 mg total) by mouth 2 (two) times daily for 7 days.  Dispense: 14 tablet; Refill: 0   No follow-ups on file.  Future Appointments  Date Time Provider Department Center  09/20/2022 11:00 AM Waymon Budge, MD [facility] None    Wendi Snipes, FNP

## 2021-12-06 NOTE — Progress Notes (Signed)
Pt here for STD screening.  Wet mount results reviewed.  Medication dispensed per Provider orders. Pt declined condoms. Graden Hoshino M Deijah Spikes, RN  

## 2022-02-20 ENCOUNTER — Other Ambulatory Visit: Payer: Self-pay | Admitting: Family Medicine

## 2022-02-20 DIAGNOSIS — M8580 Other specified disorders of bone density and structure, unspecified site: Secondary | ICD-10-CM

## 2022-02-20 DIAGNOSIS — M810 Age-related osteoporosis without current pathological fracture: Secondary | ICD-10-CM

## 2022-04-10 ENCOUNTER — Ambulatory Visit: Payer: 59

## 2022-07-17 NOTE — Progress Notes (Unsigned)
NEW PATIENT Date of Service/Encounter:  07/18/22 Referring provider: Laurann Montana, MD Primary care provider: Wilfrid Lund, PA  Subjective:  CAELAN ATCHLEY is a 56 y.o. female with a PMHx of pulmonary sarcoidosis, GERD, anxiety, depression, hyperparathyroidism, tobacco dependence, asthma, allergic rhinitis and conjunctivitis presenting today for evaluation of allergies. History obtained from: chart review and patient.   Chronic rhinitis: started in childhood Symptoms include:  halitosis and nasal congestion, dry/itchy eyes Occurs year-round Potential triggers: pollen, animal dander, smoke, dust Treatments tried: Flonase, cetirizine-not helping much Previous allergy testing: no History of reflux/heartburn: yes-not treating, symptoms occur usually with certain foods-caffeine/citrus  She was on an antibiotics and this made her having burning in her chest. Being treated for gout.  Bee venom allergy: When she gets stung, her throat swells.  Has hard time getting her breath.  Does carry an Epipen.  Has been treated in ED for this as a child.  She cannot remember the last time she was stung.  Food allergies/intolerance:  mango-throat swelling, hives,  peach-hives and itching, can't eat it fresh, can't even touch them without getting rash and itch,  tomatoes-gives her mouth sores Oranges-give her reflux  Penicillin allergy: swelling and hives; more than 10 years ago.  Latex allergy: breaks out and itches really bad with latex gloves.    Asthma with sarcoidosis: taking Symbicort and albuterol, has a nebulizer. Followed and managed by Pulmonary.  Chart review Patient is followed by ENT.  Last visit on 06/07/2022.  Using Flonase and cetirizine daily without significant improvement in her symptoms. --04/26/22: Flexible laryngoscopy shows patent anterior nasal cavity with minimal crusting, no discharge or infection. No nasal inflammatory changes, no purulent mucus. No granulomatous  changes. Some prominent adenoidal tissue Normal base of tongue and supraglottis Normal vocal cord mobility without vocal cord nodule, mass, polyp or tumor. Hypopharynx normal without mass, pooling of secretions or aspiration. --CXR: 09/19/21: Similar appearance of scarring in the upper left lungs. No acuteprocess in the chest. -- 2020 CT sinus maxillofacial: Evidence of periodontal disease of the maxillary molars right worse than left with lucency around the roots. Minimal/lesser changes at the left mandibular molar tooth 19. Few likely insignificant retention cysts of the maxillary sinuses.Can not rule out soft tissue swelling of the lower lip region. No sign of abscess. Followed by pulmonary for sarcoidosis and asthma.  Last visit 09/19/2021 using Symbicort 160. Allergy profile 01/20/15- Neg, IgE 22   Past Medical History: Past Medical History:  Diagnosis Date   Anemia    Asthma    Colon polyps    Depression    Diverticulosis    Genital herpes    Hx of adenomatous colonic polyps    Pancreatitis    Sarcoidosis    Umbilical hernia    Medication List:  Current Outpatient Medications  Medication Sig Dispense Refill   albuterol (PROAIR HFA) 108 (90 Base) MCG/ACT inhaler Inhale 2 puffs every 4 hours if needed for breathing 18 g 12   albuterol (PROVENTIL) (2.5 MG/3ML) 0.083% nebulizer solution USE 1 VIAL IN NEBULIZER 4 TIMES DAILY. Generic: VENTOLIN 150 mL 12   amoxicillin (AMOXIL) 400 MG/5ML suspension Do NOT take. Bring to allergy clinic for penicillin challenge. 50 mL 0   azelastine (ASTELIN) 0.1 % nasal spray Place 2 sprays into both nostrils 2 (two) times daily as needed for rhinitis (congestion). Use in each nostril as directed 30 mL 12   budesonide-formoterol (SYMBICORT) 160-4.5 MCG/ACT inhaler inhale 2 puffs by mouth FIRST THING IN THE  MORNING then 2 puffs 12 HOURS LATER 10.2 g 12   diphenhydrAMINE (BENADRYL) 25 MG tablet Take 25 mg by mouth daily.     EPINEPHrine 0.3 mg/0.3 mL IJ  SOAJ injection Inject 0.3 mg into the muscle as needed for anaphylaxis. 2 each 1   montelukast (SINGULAIR) 10 MG tablet Take 1 tablet (10 mg total) by mouth at bedtime. 30 tablet 3   Multiple Vitamin (MULTIVITAMIN WITH MINERALS) TABS Take 1 tablet by mouth daily.     meloxicam (MOBIC) 15 MG tablet 1 tablet (Patient not taking: Reported on 07/18/2022)     Current Facility-Administered Medications  Medication Dose Route Frequency Provider Last Rate Last Admin   0.9 %  sodium chloride infusion  500 mL Intravenous Once Armbruster, Willaim Rayas, MD       Known Allergies:  Allergies  Allergen Reactions   Bee Venom Anaphylaxis   Ciprofloxacin Swelling   Latex Rash   Mango Flavor Anaphylaxis   Penicillins Rash    Has patient had a PCN reaction causing immediate rash, facial/tongue/throat swelling, SOB or lightheadedness with hypotension: yes Has patient had a PCN reaction causing severe rash involving mucus membranes or skin necrosis: No Has patient had a PCN reaction that required hospitalization No Has patient had a PCN reaction occurring within the last 10 years: No If all of the above answers are "NO", then may proceed with Cephalosporin use.   Past Surgical History: Past Surgical History:  Procedure Laterality Date   CESAREAN SECTION     x 3   CHOLECYSTECTOMY     lap   HERNIA REPAIR     x 4   PARTIAL HYSTERECTOMY     TONSILLECTOMY     Family History: Family History  Problem Relation Age of Onset   Ovarian cancer Mother    Colon polyps Mother    Breast cancer Sister    Colon cancer Father    Stomach cancer Neg Hx    Esophageal cancer Neg Hx    Social History: Seidy has a smoking history of 0.25 packs/day, occasional alcohol use.  She is divorced.  ROS:  All other systems negative except as noted per HPI.  Objective:  Blood pressure 124/80, pulse 75, temperature (!) 97.2 F (36.2 C), resp. rate 18, height 5\' 3"  (1.6 m), weight 167 lb 12.8 oz (76.1 kg), SpO2 99 %. Body mass  index is 29.72 kg/m. Physical Exam:  General Appearance:  Alert, cooperative, no distress, appears stated age  Head:  Normocephalic, without obvious abnormality, atraumatic  Eyes:  Conjunctiva clear, EOM's intact  Nose: Nares normal, hypertrophic turbinates, normal mucosa, no visible anterior polyps, and septum midline  Throat: Lips, tongue normal; teeth and gums normal, normal posterior oropharynx  Neck: Supple, symmetrical  Lungs:   clear to auscultation bilaterally, Respirations unlabored, no coughing  Heart:  regular rate and rhythm and no murmur, Appears well perfused  Extremities: No edema  Skin: Skin color, texture, turgor normal, no rashes or lesions on visualized portions of skin  Neurologic: No gross deficits     Diagnostics: Today consultation visit only. Labs ordered as below. Patient to return next week for allergy testing.  Assessment and Plan  Chronic Rhinitis -likely allergic: -Come back next Wednesday, September 6 at 10 AM for allergy testing.  Must stop antihistamines 3 days prior to this visit. - allergen avoidance as below - Continue Nasal Steroid Spray: Options include Flonase (fluticasone), Nasocort (triamcinolone), Nasonex (mometasome) 1- 2 sprays in each nostril daily (can buy  over-the-counter if not covered by insurance)  Best results if used daily. - Continue over the counter antihistamine daily or daily as needed.   -Your options include Zyrtec (Cetirizine) 10mg , Claritin (Loratadine) 10mg , Allegra (Fexofenadine) 180mg , or Xyzal (Levocetirinze) 5mg   Allergic Conjunctivitis:  - Start Allergy Eye drops: great options include Pataday (Olopatadine) or Zaditor (ketotifen) for eye symptoms daily as needed-both sold over the counter if not covered by insurance.   -Avoid eye drops that say red eye relief as they may contain medications that dry out your eyes.  Concern For Stinging Insect Allergy: - based on today's history, will obtain labs for stinging  insects - if negative, would consider confirming with skin testing - please carry Epinephrine autoinjector at all times in case of accidental sting, to be used if stung and has SKIN AND ANY OTHER SYMPTOMS - for SKIN only, can take Benadryl 2 capsules (50 mg)  - recommend medical alert bracelet - practice avoidance measures as outlined below when possible   Food allergy:  -Return next week for food allergy testing, suspect some pollen food allergy overlap - okay to resume eating peaches, tomatoes, mangoes - for SKIN only reaction, okay to take Benadryl 2 capsules every 4 hours - for SKIN + ANY additional symptoms, OR IF concern for LIFE THREATENING reaction = Epipen Autoinjector EpiPen 0.3 mg. - If using Epinephrine autoinjector, call 911 - A food allergy action plan has been provided and discussed. - Medic Alert identification is recommended.  H/O Penicillin allergy: - please schedule follow-up appt at your convenience for penicillin testing followed by graded oral challenge if indicated - a prescription for amoxicillin has been printed and provided to you, please get this filled a day before your appointment.  Do not take prior to your appointment - please refrain from taking any antihistamines at least 3 days prior to this appointment  - around 80% of individuals outgrow this allergy in ~ 10 years and carrying it as a diagnosis can prevent you from getting proper therapy if needed  Contact allergy to latex: - Strict avoidance  Asthma and sarcoidosis: Followed by pulmonary - Continue inhalers as prescribed by pulmonary  Follow-up next week to undergo allergy testing  It was a pleasure meeting you today.  This note in its entirety was forwarded to the Provider who requested this consultation.  Thank you for your kind referral. I appreciate the opportunity to take part in Ciarra's care. Please do not hesitate to contact me with questions.  Sincerely,  , MD [facility]

## 2022-07-18 ENCOUNTER — Ambulatory Visit (INDEPENDENT_AMBULATORY_CARE_PROVIDER_SITE_OTHER): Payer: 59 | Admitting: Internal Medicine

## 2022-07-18 ENCOUNTER — Encounter: Payer: Self-pay | Admitting: Internal Medicine

## 2022-07-18 VITALS — BP 124/80 | HR 75 | Temp 97.2°F | Resp 18 | Ht 63.0 in | Wt 167.8 lb

## 2022-07-18 DIAGNOSIS — T7800XA Anaphylactic reaction due to unspecified food, initial encounter: Secondary | ICD-10-CM

## 2022-07-18 DIAGNOSIS — T63441A Toxic effect of venom of bees, accidental (unintentional), initial encounter: Secondary | ICD-10-CM | POA: Insufficient documentation

## 2022-07-18 DIAGNOSIS — Z91018 Allergy to other foods: Secondary | ICD-10-CM

## 2022-07-18 DIAGNOSIS — H1013 Acute atopic conjunctivitis, bilateral: Secondary | ICD-10-CM | POA: Diagnosis not present

## 2022-07-18 DIAGNOSIS — Z880 Allergy status to penicillin: Secondary | ICD-10-CM

## 2022-07-18 DIAGNOSIS — K219 Gastro-esophageal reflux disease without esophagitis: Secondary | ICD-10-CM | POA: Diagnosis not present

## 2022-07-18 DIAGNOSIS — T781XXA Other adverse food reactions, not elsewhere classified, initial encounter: Secondary | ICD-10-CM

## 2022-07-18 DIAGNOSIS — Z91040 Latex allergy status: Secondary | ICD-10-CM

## 2022-07-18 DIAGNOSIS — D869 Sarcoidosis, unspecified: Secondary | ICD-10-CM

## 2022-07-18 DIAGNOSIS — J3081 Allergic rhinitis due to animal (cat) (dog) hair and dander: Secondary | ICD-10-CM

## 2022-07-18 DIAGNOSIS — T7819XA Other adverse food reactions, not elsewhere classified, initial encounter: Secondary | ICD-10-CM

## 2022-07-18 MED ORDER — MONTELUKAST SODIUM 10 MG PO TABS: 10.0000 mg | ORAL_TABLET | Freq: Every day | ORAL | 3 refills | Status: DC

## 2022-07-18 MED ORDER — EPINEPHRINE 0.3 MG/0.3ML IJ SOAJ: 0.3000 mg | INTRAMUSCULAR | 1 refills | Status: AC | PRN

## 2022-07-18 MED ORDER — AMOXICILLIN 400 MG/5ML PO SUSR: ORAL | 0 refills | Status: DC

## 2022-07-18 MED ORDER — AZELASTINE HCL 0.1 % NA SOLN: 2.0000 | Freq: Two times a day (BID) | NASAL | 12 refills | Status: DC | PRN

## 2022-07-18 NOTE — Patient Instructions (Addendum)
Chronic Rhinitis -likely allergic: -Come back next Wednesday, September 6 at 10 AM for allergy testing.  Must stop antihistamines 3 days prior to this visit. - allergen avoidance as below - Continue Nasal Steroid Spray: Options include Flonase (fluticasone), Nasocort (triamcinolone), Nasonex (mometasome) 1- 2 sprays in each nostril daily (can buy over-the-counter if not covered by insurance)  Best results if used daily. - Continue over the counter antihistamine daily or daily as needed.   -Your options include Zyrtec (Cetirizine) 10mg , Claritin (Loratadine) 10mg , Allegra (Fexofenadine) 180mg , or Xyzal (Levocetirinze) 5mg   Allergic Conjunctivitis:  - Start Allergy Eye drops: great options include Pataday (Olopatadine) or Zaditor (ketotifen) for eye symptoms daily as needed-both sold over the counter if not covered by insurance.   -Avoid eye drops that say red eye relief as they may contain medications that dry out your eyes.  Concern For Stinging Insect Allergy: - based on today's history, will obtain labs for stinging insects - if negative, would consider confirming with skin testing - please carry Epinephrine autoinjector at all times in case of accidental sting, to be used if stung and has SKIN AND ANY OTHER SYMPTOMS - for SKIN only, can take Benadryl 2 capsules (50 mg)  - recommend medical alert bracelet - practice avoidance measures as outlined below when possible   Food allergy:  -Return next week for food allergy testing, suspect some pollen food allergy overlap - okay to resume eating peaches, tomatoes, mangoes - for SKIN only reaction, okay to take Benadryl 2 capsules every 4 hours - for SKIN + ANY additional symptoms, OR IF concern for LIFE THREATENING reaction = Epipen Autoinjector EpiPen 0.3 mg. - If using Epinephrine autoinjector, call 911 - A food allergy action plan has been provided and discussed. - Medic Alert identification is recommended.  H/O Penicillin allergy: -  please schedule follow-up appt at your convenience for penicillin testing followed by graded oral challenge if indicated - a prescription for amoxicillin has been printed and provided to you, please get this filled a day before your appointment.  Do not take prior to your appointment - please refrain from taking any antihistamines at least 3 days prior to this appointment  - around 80% of individuals outgrow this allergy in ~ 10 years and carrying it as a diagnosis can prevent you from getting proper therapy if needed  Contact allergy to latex: - Strict avoidance  Asthma and sarcoidosis: Followed by pulmonary - Continue inhalers as prescribed by pulmonary  Reflux:  - diet and lifestyle modifications  Follow-up next week to undergo allergy testing  It was a pleasure meeting you today.  , MD [facility]  Gastroesophageal Reflux Induced Respiratory Disease and Laryngopharyngeal Reflux (LPR): Gastroesophageal reflux disease (GERD) is a condition where the contents of the stomach reflux or back up into the esophagus or swallowing tube.  This can result in a variety of clinical symptoms including classic symptoms and atypical symptoms.  Classic symptoms of GERD include: heartburn, chest pain, acid taste in the mouth, and difficulty in swallowing.  Atypical symptoms of GERD include laryngopharyngeal reflux (LPR) and asthma.  LPR occurs when stomach reflux comes all the way up to the throat.  Clinical symptoms include hoarseness, raspy voice, laryngitis, throat clearing, postnasal drip, mucus stuck in the throat, a sensation of a lump in the throat, sore throat, and cough.  Most patients with LPR do not have classic symptoms of GERD.  Asthma can also be triggered by GERD.  The acid stomach fluid can stimulate nerve fibers in the esophagus which can cause an increase in bronchial muscle tone and narrowing of the airways.  Acid stomach contents may also reflux into the  trachea and bronchi of the lungs where it can trigger an asthma attack.  Many people with GERD triggered asthma do not have classic symptoms of GERD.  Diagnosis of LPR and GERD induced asthma is frequently made from a typical history and response to medications.  It may take several months of medications to see a good response.  Occasionally, a 24-hour esophageal pH probe study must be performed.  Treatment of GERD/LPR includes:   Modification of diet and lifestyle Stop smoking Avoid overeating and lose weight Avoid acidic and fatty foods, chocolate, onions, garlic, peppermint Elevate the head of your bed 6 to 8 inches with blocks or wedge Medications Zantac, Pepcid, Axid, Tagamet Prilosec, Prevacid, Aciphex, Protonix, Nexium Surgery

## 2022-07-19 ENCOUNTER — Other Ambulatory Visit: Payer: Self-pay

## 2022-07-19 MED ORDER — AMOXICILLIN 400 MG/5ML PO SUSR: ORAL | 0 refills | Status: DC

## 2022-07-21 LAB — ALLERGEN HYMENOPTERA PANEL
Bumblebee: <0.1 kU/L
Honeybee IgE: <0.1 kU/L
Hornet, White Face, IgE: <0.1 kU/L
Hornet, Yellow, IgE: <0.1 kU/L
Paper Wasp IgE: <0.1 kU/L
Yellow Jacket, IgE: <0.1 kU/L

## 2022-07-21 LAB — ALLERGEN, MANGO, F91: Mango IgE: <0.1 kU/L

## 2022-07-21 LAB — TRYPTASE: Tryptase: 8.3 ug/L (ref 2.2–13.2)

## 2022-07-24 NOTE — Progress Notes (Signed)
Please let Stephanie Stephens know that her allergy testing to stinging insects was negative as well as to mango. It is possible she outgrew her bee venom allergy. We can get her on the schedule for bee venom testing if she would like-this will confirm if blood work is correct.  Please let her know that it would be in our [place] office and on a specific date.  Otherwise we will see her in a few weeks for her environmental food allergy testing.

## 2022-07-30 ENCOUNTER — Ambulatory Visit: Payer: 59 | Admitting: Internal Medicine

## 2022-07-30 NOTE — Progress Notes (Deleted)
  Date of Service/Encounter:  07/30/22  Allergy testing appointment   Initial visit on 07/18/2022, seen for chronic rhinitis and conjunctivitis, bee venom allergy, history of penicillin allergy, latex contact dermatitis, and concern for food allergy.  Please see that note for additional details.  Testing plan:1-59 and peach, tomato  Today reports for allergy diagnostic testing:    DIAGNOSTICS:  Skin Testing: Environmental allergy panel and select foods. ***Adequate positive and negative controls Results discussed with patient/family.   Allergy testing results were read and interpreted by myself, documented by clinical staff.  Patient provided with copy of allergy testing along with avoidance measures when indicated. ***  Tonny Bollman, MD  [facility]

## 2022-08-01 ENCOUNTER — Ambulatory Visit: Payer: 59 | Admitting: Internal Medicine

## 2022-08-10 ENCOUNTER — Other Ambulatory Visit: Payer: Self-pay | Admitting: Internal Medicine

## 2022-08-14 ENCOUNTER — Other Ambulatory Visit: Payer: Self-pay | Admitting: Family Medicine

## 2022-08-14 ENCOUNTER — Ambulatory Visit
Admission: RE | Admit: 2022-08-14 | Discharge: 2022-08-14 | Disposition: A | Discharge status: Home / Self Care | Payer: 59 | Source: Ambulatory Visit | Attending: Family Medicine | Admitting: Family Medicine

## 2022-08-14 DIAGNOSIS — M810 Age-related osteoporosis without current pathological fracture: Secondary | ICD-10-CM

## 2022-08-14 DIAGNOSIS — Z1231 Encounter for screening mammogram for malignant neoplasm of breast: Secondary | ICD-10-CM

## 2022-08-14 DIAGNOSIS — M8580 Other specified disorders of bone density and structure, unspecified site: Secondary | ICD-10-CM

## 2022-08-17 ENCOUNTER — Encounter: Payer: Self-pay | Admitting: Neurology

## 2022-08-20 ENCOUNTER — Ambulatory Visit
Admission: RE | Admit: 2022-08-20 | Discharge: 2022-08-20 | Disposition: A | Discharge status: Home / Self Care | Payer: 59 | Source: Ambulatory Visit | Attending: Family Medicine | Admitting: Family Medicine

## 2022-08-20 DIAGNOSIS — Z1231 Encounter for screening mammogram for malignant neoplasm of breast: Secondary | ICD-10-CM

## 2022-09-05 ENCOUNTER — Other Ambulatory Visit (INDEPENDENT_AMBULATORY_CARE_PROVIDER_SITE_OTHER): Payer: 59

## 2022-09-05 ENCOUNTER — Ambulatory Visit (INDEPENDENT_AMBULATORY_CARE_PROVIDER_SITE_OTHER): Payer: 59 | Admitting: Neurology

## 2022-09-05 ENCOUNTER — Encounter: Payer: Self-pay | Admitting: Neurology

## 2022-09-05 VITALS — BP 151/95 | HR 76 | Ht 63.0 in | Wt 173.0 lb

## 2022-09-05 DIAGNOSIS — M79604 Pain in right leg: Secondary | ICD-10-CM | POA: Diagnosis not present

## 2022-09-05 DIAGNOSIS — R292 Abnormal reflex: Secondary | ICD-10-CM

## 2022-09-05 DIAGNOSIS — M79605 Pain in left leg: Secondary | ICD-10-CM | POA: Diagnosis not present

## 2022-09-05 DIAGNOSIS — R202 Paresthesia of skin: Secondary | ICD-10-CM | POA: Diagnosis not present

## 2022-09-05 LAB — CK: Total CK: 171 U/L (ref 7–177)

## 2022-09-05 LAB — B12 AND FOLATE PANEL
Folate: 19.8 ng/mL (ref >5.9)
Vitamin B-12: 388 pg/mL (ref 211–911)

## 2022-09-05 LAB — SEDIMENTATION RATE: Sed Rate: 15 mm/hr (ref 0–30)

## 2022-09-05 LAB — C-REACTIVE PROTEIN: CRP: <1 mg/dL (ref 0.5–20.0)

## 2022-09-05 NOTE — Progress Notes (Signed)
[facility] Clinic Note - Initial Visit   Date: 09/05/2022   Stephanie Stephens MRN: [id number] DOB: [birth year]   Dear Marilynne Drivers, PA:  Thank you for your kind referral of Stephanie Stephens for consultation of bilateral leg pain. Although her history is well known to you, please allow [place] to reiterate it for the purpose of our medical record. The patient was accompanied to the clinic by self.    Stephanie Stephens is a 56 y.o. ambidextrous-handed female with pulmonary sarcoidosis. osteoporosis, and depression presenting for evaluation of bilateral leg pain.   IMPRESSION/PLAN: Bilateral feet paresthesias and leg pain, nonspecific.  Symptoms are not consistent with neuropathy.  Her patella reflexes are brisk, otherwise, exam shows normal distal sensation and strength.  I offered NCS/EMG of the legs to better characterize the nature of her symptoms, however, she does not think she can tolerate testing due to pain and opted against it.  I will check labs for her leg pain - check ESR, CRP, ANA, CK, B12, folate, vitmain B1 MRI lumbar spine wo will be ordered to evaluate leg pain in the setting of hyperreflexia, but her presentation is very atypical for lumbar canal stenosis  Further recommendations pending results.   ------------------------------------------------------------- History of present illness: Starting around June 2023, she began having achy/throbbing/sharp pain involving the thighs, calf, and ankles.  She has tingling/burning sensation in the feet. No preceding injury.  She was being treated with mocrobid for UTI, but reports symptoms were already ongoing prior to taking this.  The pain occurs intermittently throughout the day.  No specific triggers such as exercise or rest.  She has pain to palpation of the thighs.  She also complains of pain in the hips, knees, and ankles.   She works as a [employer]ar[employer] manager.   Out-side paper records, electronic medical record, and  images have been reviewed where available and summarized as:  No results found for: "HGBA1C" No results found for: "VITAMINB12" Lab Results  Component Value Date   TSH 0.52 01/26/2011   Lab Results  Component Value Date   ESRSEDRATE 10 06/15/2013    Past Medical History:  Diagnosis Date   Anemia    Asthma    Colon polyps    Depression    Diverticulosis    Genital herpes    Hx of adenomatous colonic polyps    Pancreatitis    Sarcoidosis    Umbilical hernia     Past Surgical History:  Procedure Laterality Date   CESAREAN SECTION     x 3   CHOLECYSTECTOMY     lap   HERNIA REPAIR     x 4   PARTIAL HYSTERECTOMY     TONSILLECTOMY       Medications:  Outpatient Encounter Medications as of 09/05/2022  Medication Sig   albuterol (PROVENTIL) (2.5 MG/3ML) 0.083% nebulizer solution USE 1 VIAL IN NEBULIZER 4 TIMES DAILY. Generic: VENTOLIN   albuterol (VENTOLIN HFA) 108 (90 Base) MCG/ACT inhaler USE 2 INHALATIONS BY MOUTH EVERY 4 HOURS AS NEEDED   amoxicillin (AMOXIL) 400 MG/5ML suspension Do NOT take. Bring to allergy clinic for penicillin challenge.   azelastine (ASTELIN) 0.1 % nasal spray Place 2 sprays into both nostrils 2 (two) times daily as needed for rhinitis (congestion). Use in each nostril as directed   budesonide-formoterol (SYMBICORT) 160-4.5 MCG/ACT inhaler USE 2 INHALATIONS BY MOUTH FIRST THING IN THE MORNING, THEN 2  INHALATIONS 12 HOURS LATER   diphenhydrAMINE (BENADRYL) 25 MG tablet Take  25 mg by mouth daily.   EPINEPHrine 0.3 mg/0.3 mL IJ SOAJ injection Inject 0.3 mg into the muscle as needed for anaphylaxis.   Multiple Vitamin (MULTIVITAMIN WITH MINERALS) TABS Take 1 tablet by mouth daily.   [DISCONTINUED] meloxicam (MOBIC) 15 MG tablet 1 tablet (Patient not taking: Reported on 07/18/2022)   [DISCONTINUED] montelukast (SINGULAIR) 10 MG tablet Take 1 tablet (10 mg total) by mouth at bedtime. (Patient not taking: Reported on 09/05/2022)   Facility-Administered  Encounter Medications as of 09/05/2022  Medication   0.9 %  sodium chloride infusion    Allergies:  Allergies  Allergen Reactions   Bee Venom Anaphylaxis   Ciprofloxacin Swelling   Latex Rash   Mango Flavor Anaphylaxis   Penicillins Rash    Has patient had a PCN reaction causing immediate rash, facial/tongue/throat swelling, SOB or lightheadedness with hypotension: yes Has patient had a PCN reaction causing severe rash involving mucus membranes or skin necrosis: No Has patient had a PCN reaction that required hospitalization No Has patient had a PCN reaction occurring within the last 10 years: No If all of the above answers are "NO", then may proceed with Cephalosporin use.   Peach [Prunus Persica] Hives   Tomato Rash    Family History: Family History  Problem Relation Age of Onset   Ovarian cancer Mother    Colon polyps Mother    Colon cancer Father    Breast cancer Sister    Stomach cancer Neg Hx    Esophageal cancer Neg Hx     Social History: Social History   Tobacco Use   Smoking status: Some Days    Packs/day: 0.25    Types: Cigarettes   Smokeless tobacco: Never   Tobacco comments:    Situational smoker   Vaping Use   Vaping Use: Never used  Substance Use Topics   Alcohol use: Yes    Alcohol/week: 3.0 standard drinks of alcohol    Types: 3 Standard drinks or equivalent per week    Comment: last drink was 11/19/2021   Drug use: Not Currently    Types: Marijuana    Comment: last used 11/18/2021   Social History   Social History Narrative   Son incarcerated.   Writes with both left and right     Vital Signs:  BP (!) 151/95   Pulse 76   Ht _0  (1.6 m)   Wt 173 lb (78.5 kg)   SpO2 100%   BMI 30.65 kg/m    Neurological Exam: MENTAL STATUS including orientation to time, place, person, recent and remote memory, attention span and concentration, language, and fund of knowledge is normal.  Speech is not dysarthric.  CRANIAL NERVES: II:  No visual  field defects.     III-IV-VI: Pupils equal round and reactive to light.  Normal conjugate, extra-ocular eye movements in all directions of gaze.  No nystagmus.  No ptosis.   V:  Normal facial sensation.    VII:  Normal facial symmetry and movements.   VIII:  Normal hearing and vestibular function.   IX-X:  Normal palatal movement.   XI:  Normal shoulder shrug and head rotation.   XII:  Normal tongue strength and range of motion, no deviation or fasciculation.  MOTOR:  No atrophy, fasciculations or abnormal movements.  No pronator drift.  She has pain with palpation of the thighs Upper Extremity:  Right  Left  Deltoid  5/5   5/5   Biceps  5/5   5/5  Triceps  5/5   5/5   Wrist extensors  5/5   5/5   Wrist flexors  5/5   5/5   Finger extensors  5/5   5/5   Finger flexors  5/5   5/5   Dorsal interossei  5/5   5/5   Abductor pollicis  5/5   5/5   Tone (Ashworth scale)  0  0   Lower Extremity:  Right  Left  Hip flexors  5/5   5/5   Hip extensors  5/5   5/5   Adductor 5/5  5/5  Abductor 5/5  5/5  Knee flexors  5/5   5/5   Knee extensors  5/5   5/5   Dorsiflexors  5/5   5/5   Plantarflexors  5/5   5/5   Toe extensors  5/5   5/5   Toe flexors  5/5   5/5   Tone (Ashworth scale)  0  0   MSRs:                                           Right        Left brachioradialis 2+  2+  biceps 2+  2+  triceps 2+  2+  patellar 2+  2+  ankle jerk 2+  2+  Hoffman no  no  plantar response down  down   SENSORY:  Normal and symmetric perception of light touch, pinprick, vibration, and proprioception.  Romberg's sign absent.   COORDINATION/GAIT: Normal finger-to- nose-finger.  Intact rapid alternating movements bilaterally.  Able to rise from a chair without using arms. Gait narrow based and stable. Tandem and stressed gait intact.    Thank you for allowing me to participate in patient's care.  If I can answer any additional questions, I would be pleased to do so.    Sincerely,    Asa Baudoin  K. Posey Pronto, DO

## 2022-09-05 NOTE — Patient Instructions (Signed)
Check labs  MRI lumbar spine without contrast  If you would like to schedule nerve and muscle testing, please call the office  ELECTROMYOGRAM AND NERVE CONDUCTION STUDIES (EMG/NCS) INSTRUCTIONS  How to Prepare The neurologist conducting the EMG will need to know if you have certain medical conditions. Tell the neurologist and other EMG lab personnel if you: Have a pacemaker or any other electrical medical device Take blood-thinning medications Have hemophilia, a blood-clotting disorder that causes prolonged bleeding Bathing Take a shower or bath shortly before your exam in order to remove oils from your skin. Don't apply lotions or creams before the exam.  What to Expect You'll likely be asked to change into a hospital gown for the procedure and lie down on an examination table. The following explanations can help you understand what will happen during the exam.  Electrodes. The neurologist or a technician places surface electrodes at various locations on your skin depending on where you're experiencing symptoms. Or the neurologist may insert needle electrodes at different sites depending on your symptoms.  Sensations. The electrodes will at times transmit a tiny electrical current that you may feel as a twinge or spasm. The needle electrode may cause discomfort or pain that usually ends shortly after the needle is removed. If you are concerned about discomfort or pain, you may want to talk to the neurologist about taking a short break during the exam.  Instructions. During the needle EMG, the neurologist will assess whether there is any spontaneous electrical activity when the muscle is at rest - activity that isn't present in healthy muscle tissue - and the degree of activity when you slightly contract the muscle.  He or she will give you instructions on resting and contracting a muscle at appropriate times. Depending on what muscles and nerves the neurologist is examining, he or she may ask  you to change positions during the exam.  After your EMG You may experience some temporary, minor bruising where the needle electrode was inserted into your muscle. This bruising should fade within several days. If it persists, contact your primary care doctor.

## 2022-09-10 LAB — ANA: Anti Nuclear Antibody (ANA): NEGATIVE

## 2022-09-10 LAB — VITAMIN B1: Vitamin B1 (Thiamine): 10 nmol/L (ref 8–30)

## 2022-09-11 NOTE — Progress Notes (Unsigned)
Date of Service/Encounter:  09/12/22  Allergy testing appointment   Initial visit on 07/18/22, seen for Chronic rhinitis and conjunctivitis, concern for stinging insect allergy, concern for food allergy, history of penicillin allergy, contact allergy to latex, asthma and sarcoidosis followed by pulmonary..  Please see that note for additional details.  Plan for testing: 1-59, peach, tomatoes, oranges  Prior to testing she reports that she had to stop antihistamines and nasal sprays because it was elevating her blood pressure.  She is no longer using either of these medications.  Additionally she reports that she is now eating mangoes, peaches, tomatoes and oranges without adverse symptoms.  She does try to limit her quantity of these foods to avoid intolerances. These will be removed from her allergy list  Today reports for allergy diagnostic testing:   DIAGNOSTICS:  Skin Testing: Environmental allergy panel and select foods. Adequate positive and negative controls Results discussed with patient/family.  Airborne Adult Perc - 09/12/22 1352     Time Antigen Placed 0150    Allergen Manufacturer Waynette Buttery    Location Back    Number of Test 59    Panel 1 Select    1. Control-Buffer 50% Glycerol Negative    2. Control-Histamine 1 mg/ml 3+    3. Albumin saline Negative    4. Bahia Negative    5. French Southern Territories Negative    6. Johnson Negative    7. Kentucky Blue Negative    8. Meadow Fescue Negative    9. Perennial Rye Negative    10. Sweet Vernal Negative    11. Timothy Negative    12. Cocklebur Negative    13. Burweed Marshelder Negative    14. Ragweed, short Negative    15. Ragweed, Giant Negative    16. Plantain,  English Negative    17. Lamb's Quarters Negative    18. Sheep Sorrell Negative    19. Rough Pigweed Negative    20. Marsh Elder, Rough Negative    21. Mugwort, Common Negative    22. Ash mix Negative    23. Birch mix Negative    24. Beech American Negative    25. Box,  Elder Negative    26. Cedar, red Negative    27. Cottonwood, Guinea-Bissau Negative    28. Elm mix Negative    29. Hickory Negative    30. Maple mix Negative    31. Oak, Guinea-Bissau mix Negative    32. Pecan Pollen Negative    33. Pine mix Negative    34. Sycamore Eastern Negative    35. Walnut, Black Pollen Negative    36. Alternaria alternata Negative    37. Cladosporium Herbarum Negative    38. Aspergillus mix Negative    39. Penicillium mix Negative    40. Bipolaris sorokiniana (Helminthosporium) Negative    41. Drechslera spicifera (Curvularia) Negative    42. Mucor plumbeus Negative    43. Fusarium moniliforme Negative    44. Aureobasidium pullulans (pullulara) Negative    45. Rhizopus oryzae Negative    46. Botrytis cinera Negative    47. Epicoccum nigrum Negative    48. Phoma betae Negative    49. Candida Albicans Negative    50. Trichophyton mentagrophytes Negative    51. Mite, D Farinae  5,000 AU/ml Negative    52. Mite, D Pteronyssinus  5,000 AU/ml Negative    53. Cat Hair 10,000 BAU/ml Negative    54.  Dog Epithelia Negative    55. Mixed Feathers Negative  56. Horse Epithelia Negative    57. Cockroach, German Negative    58. Mouse Negative    59. Tobacco Leaf Negative             Food Adult Perc - 09/12/22 1300     Time Antigen Placed 0150    Allergen Manufacturer Lavella Hammock    Location Back    Number of allergen test 3    42. Tomato Negative    56. Orange  Negative    59. Peach Negative             Allergy testing results were read and interpreted by myself, documented by clinical staff.  Patient provided with copy of allergy testing along with avoidance measures when indicated.   Sigurd Sos, MD  [facility]

## 2022-09-12 ENCOUNTER — Ambulatory Visit (INDEPENDENT_AMBULATORY_CARE_PROVIDER_SITE_OTHER): Payer: 59 | Admitting: Internal Medicine

## 2022-09-12 ENCOUNTER — Encounter: Payer: Self-pay | Admitting: Internal Medicine

## 2022-09-12 DIAGNOSIS — J310 Chronic rhinitis: Secondary | ICD-10-CM | POA: Diagnosis not present

## 2022-09-12 DIAGNOSIS — T7800XA Anaphylactic reaction due to unspecified food, initial encounter: Secondary | ICD-10-CM

## 2022-09-12 DIAGNOSIS — H1013 Acute atopic conjunctivitis, bilateral: Secondary | ICD-10-CM

## 2022-09-12 NOTE — Patient Instructions (Signed)
Chronic Rhinitis-nonallergic -allergy testing today was negative - consider nasal steroid spray like nasacort 1-2 sprays daily for control of symptoms  Chronic Conjunctivitis:  - Continue Allergy Eye drops: great options include Pataday (Olopatadine) or Zaditor (ketotifen) for eye symptoms daily as needed-both sold over the counter if not covered by insurance.   -Avoid eye drops that say red eye relief as they may contain medications that dry out your eyes.  Concern For Stinging Insect Allergy: - Lab testing negative, would recommend skin testing to confirm - please carry Epinephrine autoinjector at all times in case of accidental sting, to be used if stung and has SKIN AND ANY OTHER SYMPTOMS - for SKIN only, can take Benadryl 2 capsules (50 mg)  - recommend medical alert bracelet - practice avoidance measures as outlined below when possible   Food allergy: RESOLVED - now eating mango,peach, orange and tomato - testing negative to mango, peach, orange and tomato  H/O Penicillin allergy: - please schedule follow-up appt at your convenience for penicillin testing followed by graded oral challenge if indicated - a prescription for amoxicillin has been printed and provided to you, please get this filled a day before your appointment.  Do not take prior to your appointment - please refrain from taking any antihistamines at least 3 days prior to this appointment  - around 80% of individuals outgrow this allergy in ~ 10 years and carrying it as a diagnosis can prevent you from getting proper therapy if needed  Contact allergy to latex: - Strict avoidance  Asthma and sarcoidosis: Followed by pulmonary - Continue inhalers as prescribed by pulmonary  Reflux:  - diet and lifestyle modifications  Follow-up for penicillin testing at your convenience It was a pleasure seeing you again today.  Sigurd Sos, MD [facility]-

## 2022-09-17 ENCOUNTER — Ambulatory Visit: Payer: 59 | Admitting: Neurology

## 2022-09-20 ENCOUNTER — Ambulatory Visit: Payer: 59 | Admitting: Internal Medicine

## 2022-10-19 NOTE — Progress Notes (Signed)
HPI   Former smoker followed for Sarcoid, allergic rhinitis, allergic conjunctivitis angioedema,, complicated by GERD, cervical node,  ACE 05/2013- 56H Allergy profile 01/20/15- Neg, IgE 22 ACE level 01/20/15- stable at 53 ACE 05/16/20- 39 WNL ---------------------------------------------------------------------- 09/19/21-55 yoF  Former heavy / occasional smoker followed for Sarcoid, allergic rhinitis, allergic conjunctivitis, angioedema, complicated by GERD, cervical node, -Symbicort 160, neb albuterol, albuterol hfa, DM2, Covid vax- Flu vax-  -----C/o sob increased and wheezing 1 mth. Ago, feeling  some better now ACT score 12 No infection. Much stress with 4 deaths this year among family. She can't afford a car   a job. Needs meds refilled. Smoking some. Discussed. Cannot find work and cannot afford a car to get her self to work.  Depends on friends or family to get her to necessary errands.  Fortunately has avoided major illness and able to get her inhalers.  Needs refill nebulizer solution and rescue inhaler. Sarcoid is unlikely to reactivate but potential concerns her.  10/22/22- 89 yoF  Former heavy / occasional smoker followed for Sarcoid, allergic rhinitis, allergic conjunctivitis, angioedema, complicated by GERD, cervical node, -Symbicort 160, neb albuterol, albuterol hfa, DM2, Covid vax- Flu vax- will return for standard Saw Allergist in October for Allergy testing> broadly negative>  Variable nasal stuffiness, sensitive to weather changes. We discussed vasomotor rhinitis and available nasal sprays. Little cough or wheeze. Asks to return for flu vax after upcoming cataract surgery. Also going to have eyelid nodule removed.. CXR 09/19/21- IMPRESSION: Similar appearance of scarring in the upper left lungs. No acute process in the chest.  ROS-see HPI   + = positive Constitutional:    weight loss, night sweats, fevers, chills, fatigue, lassitude. HEENT:    headaches, difficulty  swallowing, tooth/dental problems, sore throat,       sneezing, itching, ear ache, + nasal congestion, post nasal drip, snoring CV:    chest pain, orthopnea, PND, swelling in lower extremities, anasarca,                                                     dizziness, palpitations Resp:   shortness of breath with exertion or at rest.                + productive cough,   non-productive cough, coughing up of blood.              change in color of mucus.  wheezing.   Skin:    rash or lesions. GI:  No-   heartburn, indigestion, abdominal pain, nausea, vomiting, diarrhea,                 change in bowel habits, loss of appetite GU: dysuria, change in color of urine, no urgency or frequency.   flank pain. MS:   joint pain, stiffness, decreased range of motion, back pain. Neuro-     nothing unusual Psych:  change in mood or affect.  depression or anxiety.   memory loss.  OBJ- Physical Exam General- Alert, Oriented, Affect-+tearful, Distress- none acute Skin- +rubbery nodules in skin of upper R eyelid Lymphadenopathy- none Head- atraumatic            Eyes- Gross vision intact, PERRLA, conjunctivae and secretions clear            Ears- Hearing, canals-normal  Nose-  turbinate edema, no-Septal dev, mucus, polyps- none seen, erosion, perforation             Throat- Mallampati II , mucosa clear , drainage- none, tonsils- atrophic Neck- flexible , trachea midline, no stridor , thyroid nl, carotid no bruit Chest - symmetrical excursion , unlabored           Heart/CV- RRR , no murmur , no gallop  , no rub, nl s1 s2                           - JVD- none , edema- none, stasis changes- none, varices- none           Lung- clear to P&A, wheeze- none, cough- none , dullness-none, rub- none           Chest wall-  Abd-  Br/ Gen/ Rectal- Not done, not indicated Extrem- cyanosis- none, clubbing, none, atrophy- none, strength- nl Neuro- grossly intact to observation

## 2022-10-22 ENCOUNTER — Encounter: Payer: Self-pay | Admitting: Internal Medicine

## 2022-10-22 ENCOUNTER — Ambulatory Visit (INDEPENDENT_AMBULATORY_CARE_PROVIDER_SITE_OTHER): Payer: 59 | Admitting: Internal Medicine

## 2022-10-22 VITALS — BP 128/74 | HR 75 | Temp 98.4°F | Ht 63.0 in | Wt 172.6 lb

## 2022-10-22 DIAGNOSIS — D869 Sarcoidosis, unspecified: Secondary | ICD-10-CM

## 2022-10-22 DIAGNOSIS — J301 Allergic rhinitis due to pollen: Secondary | ICD-10-CM | POA: Diagnosis not present

## 2022-10-22 DIAGNOSIS — J4520 Mild intermittent asthma, uncomplicated: Secondary | ICD-10-CM

## 2022-10-22 DIAGNOSIS — Z720 Tobacco use: Secondary | ICD-10-CM | POA: Diagnosis not present

## 2022-10-22 DIAGNOSIS — F17200 Nicotine dependence, unspecified, uncomplicated: Secondary | ICD-10-CM

## 2022-10-22 NOTE — Assessment & Plan Note (Signed)
Vasomotor rhinitis Plan- ok to use saline nasal spray or steroid nasal spray as needed

## 2022-10-22 NOTE — Assessment & Plan Note (Signed)
Clinically stable. Old scarring on CXR. Plan- she is having a persistent nodule removed from R eyelid- suspect this may be old sarcoid nodule.

## 2022-10-22 NOTE — Assessment & Plan Note (Addendum)
Reports only occasional smoking now smoking Encouraged to quit.

## 2022-10-22 NOTE — Assessment & Plan Note (Signed)
Mild intermittent uncomplicated Plan- Well managed with current meds

## 2022-10-22 NOTE — Patient Instructions (Signed)
Order- flu vax standard- ok to return for this after cataract surgery  Please call if we can help

## 2022-11-21 ENCOUNTER — Encounter: Payer: Self-pay | Admitting: Internal Medicine

## 2022-11-21 NOTE — Assessment & Plan Note (Signed)
Still smokes occasionally under stress.  I encouraged her to work on this.  She has significantly reduced her habit.

## 2022-11-30 ENCOUNTER — Other Ambulatory Visit: Payer: Self-pay | Admitting: Family Medicine

## 2022-11-30 DIAGNOSIS — M5416 Radiculopathy, lumbar region: Secondary | ICD-10-CM

## 2022-12-25 ENCOUNTER — Ambulatory Visit
Admission: RE | Admit: 2022-12-25 | Discharge: 2022-12-25 | Disposition: A | Discharge status: Home / Self Care | Payer: 59 | Source: Ambulatory Visit | Attending: Family Medicine | Admitting: Family Medicine

## 2022-12-25 DIAGNOSIS — M5416 Radiculopathy, lumbar region: Secondary | ICD-10-CM

## 2023-03-08 ENCOUNTER — Encounter: Payer: Self-pay | Admitting: Gastroenterology

## 2023-04-04 ENCOUNTER — Telehealth: Payer: Self-pay | Admitting: Internal Medicine

## 2023-04-04 MED ORDER — ALBUTEROL SULFATE (2.5 MG/3ML) 0.083% IN NEBU: INHALATION_SOLUTION | RESPIRATORY_TRACT | 12 refills | Status: DC

## 2023-04-04 NOTE — Telephone Encounter (Signed)
Neb solution refilled at [facility]

## 2023-05-28 ENCOUNTER — Other Ambulatory Visit: Payer: Self-pay | Admitting: Internal Medicine

## 2023-06-08 ENCOUNTER — Other Ambulatory Visit: Payer: Self-pay | Admitting: Internal Medicine

## 2023-07-17 ENCOUNTER — Other Ambulatory Visit: Payer: Self-pay | Admitting: Internal Medicine

## 2023-07-29 ENCOUNTER — Encounter: Payer: Self-pay | Admitting: Gastroenterology

## 2023-08-05 ENCOUNTER — Other Ambulatory Visit: Payer: Self-pay | Admitting: Family Medicine

## 2023-08-05 DIAGNOSIS — Z1231 Encounter for screening mammogram for malignant neoplasm of breast: Secondary | ICD-10-CM

## 2023-08-23 ENCOUNTER — Other Ambulatory Visit: Payer: Self-pay | Admitting: General Surgery

## 2023-08-23 DIAGNOSIS — R109 Unspecified abdominal pain: Secondary | ICD-10-CM

## 2023-09-02 ENCOUNTER — Ambulatory Visit (AMBULATORY_SURGERY_CENTER): Payer: 59

## 2023-09-02 VITALS — Ht 64.0 in | Wt 173.0 lb

## 2023-09-02 DIAGNOSIS — Z800 Family history of malignant neoplasm of digestive organs: Secondary | ICD-10-CM

## 2023-09-02 MED ORDER — NA SULFATE-K SULFATE-MG SULF 17.5-3.13-1.6 GM/177ML PO SOLN: 1.0000 | Freq: Once | ORAL | 0 refills | Status: AC

## 2023-09-02 NOTE — Progress Notes (Signed)
Pre visit completed via phone call; Patient verified name, DOB, and address; No egg or soy allergy known to patient;  No issues known to pt with past sedation with any surgeries or procedures; Patient denies ever being told they had issues or difficulty with intubation;  No FH of Malignant Hyperthermia; Pt is not on diet pills; Pt is not on home 02;  Pt is not on blood thinners;  Pt denies issues with constipation;  No A fib or A flutter; Have any cardiac testing pending--NO Insurance verified during PV appt--- [facility] Medicare   Pt can ambulate without assistance;  Pt denies use of chewing tobacco; Discussed diabetic/weight loss medication holds; Discussed NSAID holds; Checked BMI to be less than 50; Pt instructed to use Singlecare.com or GoodRx for a price reduction on prep;  Patient's chart reviewed by Cathlyn Parsons CNRA prior to previsit and patient appropriate for the [facility];  Pre visit completed and red dot placed by patient's name on their procedure day (on provider's schedule).    Instructions printed and mailed to the patient per her request;

## 2023-09-04 ENCOUNTER — Ambulatory Visit
Admission: RE | Admit: 2023-09-04 | Discharge: 2023-09-04 | Disposition: A | Discharge status: Home / Self Care | Payer: 59 | Source: Ambulatory Visit | Attending: Family Medicine | Admitting: Family Medicine

## 2023-09-04 DIAGNOSIS — Z1231 Encounter for screening mammogram for malignant neoplasm of breast: Secondary | ICD-10-CM

## 2023-09-12 ENCOUNTER — Ambulatory Visit
Admission: RE | Admit: 2023-09-12 | Discharge: 2023-09-12 | Disposition: A | Discharge status: Home / Self Care | Payer: 59 | Source: Ambulatory Visit | Attending: General Surgery | Admitting: General Surgery

## 2023-09-12 DIAGNOSIS — R109 Unspecified abdominal pain: Secondary | ICD-10-CM

## 2023-09-12 MED ORDER — IOPAMIDOL (ISOVUE-300) INJECTION 61%
200.0000 mL | Freq: Once | INTRAVENOUS | Status: AC | PRN
  Administered 2023-09-12: 100 mL via INTRAVENOUS

## 2023-09-16 ENCOUNTER — Other Ambulatory Visit: Payer: Self-pay | Admitting: General Surgery

## 2023-09-16 DIAGNOSIS — N3289 Other specified disorders of bladder: Secondary | ICD-10-CM

## 2023-09-16 NOTE — Progress Notes (Signed)
Patient's CT resulted and did not show evidence of a recurrent hernia.  However, there were signs of diverticulitis as well as bladder thickening concerning for possible neoplasm.   - I called the patient to notify her of her results. She reports that she is currently asymptomatic and has a known history of diverticulitis  I do not believe anything needs to be done for this right now but offered to see her back if she develops a flare or has questions regarding possible surgery - I discussed with her the findings in the bladder.  I have placed a referral to urology and encouraged her to reach back out if she does not hear from them in the next few days.  - Follow up PRN.  Melody Haver, MD   General Surgeon [facility], [place]

## 2023-09-18 ENCOUNTER — Telehealth: Payer: Self-pay | Admitting: Gastroenterology

## 2023-09-18 NOTE — Telephone Encounter (Signed)
Okay sounds good. Thank you.

## 2023-09-18 NOTE — Telephone Encounter (Signed)
Inbound call from patient states she recently had a CT scan and it showed diverticulitis, patient would like Dr. Donnetta Hutching to review results and advise if this should hinder her colonoscopy on 11/4.

## 2023-09-18 NOTE — Telephone Encounter (Signed)
Dr. Adela Lank,   Please see attached message and advise.   Thank you, PV

## 2023-09-18 NOTE — Telephone Encounter (Signed)
CT reviewed - it questions possible diverticulitis but not definitive. If she has been having lower abdominal pain or symptoms of diverticulitis and has been treated for it, then yes would postpone her colonoscopy by 6 weeks or so. However if no abdominal pains, no symptoms of diverticulitis, and this was an incidental finding and she is feeling okay, I think okay to proceed. Can you let me know? Thanks

## 2023-09-18 NOTE — Telephone Encounter (Signed)
Spoke with the patient, she is not having any issues currently. Pt states she's feeling ok advised her that is was ok to proceed as scheduled and to call us if she begins to have any issues.

## 2023-09-23 ENCOUNTER — Encounter: Payer: 59 | Admitting: Gastroenterology

## 2023-09-23 ENCOUNTER — Telehealth: Payer: Self-pay | Admitting: Gastroenterology

## 2023-09-23 NOTE — Telephone Encounter (Signed)
This is unfortunate - she unfortunately needs to have a no show fee given this occurrence and no advanced notice.

## 2023-09-23 NOTE — Telephone Encounter (Signed)
Good Morning Dr. Adela Lank,  I called this patient at 9:45 am to see if she was coming for her procedure, she stated she called yesterday to cancel her procedure.She stated she  did not have a care partner to stay with her durning her procedure.    I will NO SHOW her   [facility] Medicare

## 2024-01-20 ENCOUNTER — Ambulatory Visit: Admitting: Internal Medicine

## 2024-01-20 ENCOUNTER — Ambulatory Visit (HOSPITAL_BASED_OUTPATIENT_CLINIC_OR_DEPARTMENT_OTHER): Payer: Self-pay | Admitting: Internal Medicine

## 2024-01-20 ENCOUNTER — Encounter: Payer: Self-pay | Admitting: Internal Medicine

## 2024-01-20 VITALS — BP 116/80 | HR 70 | Temp 98.5°F | Ht 64.0 in | Wt 166.0 lb

## 2024-01-20 DIAGNOSIS — Z87891 Personal history of nicotine dependence: Secondary | ICD-10-CM

## 2024-01-20 DIAGNOSIS — J069 Acute upper respiratory infection, unspecified: Secondary | ICD-10-CM | POA: Diagnosis not present

## 2024-01-20 DIAGNOSIS — D860 Sarcoidosis of lung: Secondary | ICD-10-CM | POA: Diagnosis not present

## 2024-01-20 NOTE — Patient Instructions (Addendum)
 It was a pleasure to see you today!  You have a mild viral upper respiratory illness. I recommend over the counter cold and flu medication For cough - delsym cough syrup Nasal saline to clean out the nasal passages. Anti-histamines such as benadryl and cetirizine to help with nasal drainage.  For short term nasal congest (no more than 3-4 days) you can also take psedoephedrine (which is behind the counter at the pharmacy.)  We tested you for covid and flu today which were negative. I did offer to test you again - if you test at home and it turns positive please let us know.  Please keep your follow up appointment with Dr. Maple Hudson.

## 2024-01-20 NOTE — Telephone Encounter (Signed)
 Patient OV, here now, to see Dr. Celine Mans. 01/20/2024 at 10:30 am.  Will close this encounter.

## 2024-01-20 NOTE — Telephone Encounter (Signed)
  Chief Complaint: sinus congestion Symptoms: facial/nasal congestion, nasal discharge Frequency: Last Thursday Pertinent Negatives: Patient denies fever, CP, SOB Disposition: [] ED /[] Urgent Care (no appt availability in office) / [x] Appointment(In office/virtual)/ []  [facility]/ [] Home Care/ [] Refused Recommended Disposition /[] [place] [facility]/ []  Follow-up with PCP Additional Notes: Pt c/o sinus congestion since last Thursday. Pt endorses green nasal discharge and is wanting rx. Denies any fever, CP, SOB. Reports is not taking inhaled rx as prescribed, triager reinforced usage. Pt reports that all home care does not work. Pt requesting appt/rx. Pulmonology PAA Kim notified of disposition, patient transferred for scheduling. Patient verbalized understanding and to call back/911 with worsening symptoms.    Reason for Disposition  [1] SEVERE pain AND [2] not improved 2 hours after pain medicine  Answer Assessment - Initial Assessment Questions E2C2 Pulmonary Triage - Initial Assessment Questions "Chief Complaint (e.g., cough, sob, wheezing, fever, chills, sweat or additional symptoms) *Go to specific symptom protocol after initial questions. Sinus congestion, nose and and face congestion  "How long have symptoms been present?" Since Thursday  Have you tested for COVID or Flu? Note: If not, ask patient if a home test can be taken. If so, instruct patient to call back for positive results. No, has no tests  MEDICINES:   "Have you used any OTC meds to help with symptoms?" No If yes, ask "What medications?" Theraflu   "Have you used your inhalers/maintenance medication?" Yes If yes, "What medications?" Albuterol - 2-3 times, reports non-compliance Symbicort - PRN  If inhaler, ask "How many puffs and how often?" Note: Review instructions on medication in the chart. See above  OXYGEN: "Do you wear supplemental oxygen?" No If yes, "How many liters are you supposed  to use?" N/a  "Do you monitor your oxygen levels?" Yes If yes, "What is your reading (oxygen level) today?" 90  "What is your usual oxygen saturation reading?"  (Note: Pulmonary O2 sats should be 90% or greater) 90's   1. LOCATION: "Where does it hurt?"      Nose and face  3. SEVERITY: "How bad is the pain?"   (Scale 1-10; mild, moderate or severe)   - MILD (1-3): doesn't interfere with normal activities    - MODERATE (4-7): interferes with normal activities (e.g., work or school) or awakens from sleep   - SEVERE (8-10): excruciating pain and patient unable to do any normal activities        mild 4. RECURRENT SYMPTOM: "Have you ever had sinus problems before?" If Yes, ask: "When was the last time?" and "What happened that time?"      Yes, but not this bad 5. NASAL CONGESTION: "Is the nose blocked?" If Yes, ask: "Can you open it or must you breathe through your mouth?"     Mouth breathing 6. NASAL DISCHARGE: "Do you have discharge from your nose?" If so ask, "What color?"     Green discharge 7. FEVER: "Do you have a fever?" If Yes, ask: "What is it, how was it measured, and when did it start?"      No, reports automatically taking tylenol ATC every 4 hours 8. OTHER SYMPTOMS: "Do you have any other symptoms?" (e.g., sore throat, cough, earache, difficulty breathing)     Scratchy throat Denies SOB, CP  Protocols used: Sinus Pain or Congestion-A-AH

## 2024-01-20 NOTE — Progress Notes (Signed)
 Stephanie Stephens    [id number]    [birth year]  Primary Care Physician:Becker, Ann Maki, PA Date of Appointment: 01/20/2024 Established Patient Visit  Chief complaint:   Chief Complaint  Patient presents with   Acute Visit    Pt states she has green fluid coming from nose nothing else started this morning      HPI: Stephanie Stephens is a 58 y.o. woman who presents for acute visit for cough with green sputum production. She is a patient of Dr. Maple Hudson. Last saw Dr. Maple Hudson 1.5 years ago. Takes prn symbicort and albuterol for sarcoidosis. Ongoing tobacco use disorder.  She thought she was seeing Dr. Maple Hudson today.   Symptoms started 4 days ago. No fevers, chills night sweats, body aches. She does have a scratchy throat. Feeling sinus congestion and pressure. Taking tylenol "in case she had a fever."  She is not taking any nasal sprays. She says astelin nasal spray made her nose bleed. She was given an over the counter nasal spray.   She has a grandson who is 24 year old and she helps take care of him. He was sick recently.   I have reviewed the patient's family social and past medical history and updated as appropriate.   Past Medical History:  Diagnosis Date   Anemia    Asthma    Colon polyps    Depression    Diverticulosis    Genital herpes    Hx of adenomatous colonic polyps    Pancreatitis    Sarcoidosis    Umbilical hernia     Past Surgical History:  Procedure Laterality Date   CATARACT EXTRACTION, BILATERAL Bilateral 2023   CESAREAN SECTION     x 3   CHOLECYSTECTOMY     lap   COLONOSCOPY  2019   SA-MAC-suprep(good)-tics/hems   HERNIA REPAIR     x 4   PARTIAL HYSTERECTOMY     TONSILLECTOMY      Family History  Problem Relation Age of Onset   Ovarian cancer Mother    Colon polyps Mother    Colon polyps Father    Colon cancer Father    Breast cancer Sister    Stomach cancer Brother 35   Esophageal cancer Neg Hx    Rectal cancer Neg Hx     Social  History   Occupational History   Occupation: Disability-due to sarcoid   Occupation: studying Insurance claims handler course  Tobacco Use   Smoking status: Former    Current packs/day: 0.25    Types: Cigarettes   Smokeless tobacco: Never   Tobacco comments:    Situational smoker   Vaping Use   Vaping status: Never Used  Substance and Sexual Activity   Alcohol use: Yes    Alcohol/week: 3.0 standard drinks of alcohol    Types: 3 Standard drinks or equivalent per week    Comment: last drink was 11/19/2021   Drug use: Not Currently    Types: Marijuana    Comment: last used 11/18/2021   Sexual activity: Yes    Birth control/protection: Surgical, Condom     Physical Exam: Blood pressure 116/80, pulse 70, temperature 98.5 F (36.9 C), temperature source Oral, height 5\' 4"  (1.626 m), weight 166 lb (75.3 kg), SpO2 99%.  Gen:      No acute distress ENT:  +cobblestoning, no nasal polyps, mucus membranes moist, mild septal deviation Lungs:    No increased respiratory effort, symmetric chest wall  excursion, clear to auscultation bilaterally, no wheezes or crackles CV:         Regular rate and rhythm; no murmurs, rubs, or gallops.  No pedal edema   Data Reviewed: Imaging: I have personally reviewed the chest xray 09/2021, no acute process. ?LUL scarring which is stable.   PFTs:   Labs: Lab Results  Component Value Date   WBC 7.4 12/12/2018   HGB 13.8 12/12/2018   HCT 44.3 12/12/2018   MCV 99.3 12/12/2018   PLT 289 12/12/2018   Lab Results  Component Value Date   NA 140 12/12/2018   K 4.1 12/12/2018   CO2 27 12/12/2018   GLUCOSE 70 12/12/2018   BUN 15 12/12/2018   CREATININE 0.79 12/12/2018   CALCIUM 10.2 12/12/2018   GFR 90.91 06/29/2014   GFRNONAA >60 12/12/2018    Immunization status: Immunization History  Administered Date(s) Administered   Hepatitis B 12/14/1996   Influenza Split 08/20/2011, 09/19/2012, 08/19/2013, 08/24/2015   Influenza Whole 09/15/2009,  09/04/2010   Influenza,inj,Quad PF,6+ Mos 08/16/2014, 08/19/2016, 09/19/2021   Influenza-Unspecified 12/22/2012, 07/20/2014   Moderna Sars-Covid-2 Vaccination 09/15/2020, 10/21/2020   PPD Test 02/04/2013, 04/06/2022   Pneumococcal Polysaccharide-23 11/20/2007, 01/13/2015   Tdap 01/13/2015    External Records Personally Reviewed: pulmonary  Assessment:  Viral URI Pulmonary sarcoidosis History of tobacco use disorder  Plan/Recommendations:  You have a mild viral upper respiratory illness. I recommend over the counter cold and flu medication For cough - delsym cough syrup Nasal saline to clean out the nasal passages. Anti-histamines such as benadryl and cetirizine to help with nasal drainage.  For short term nasal congest (no more than 3-4 days) you can also take psedoephedrine (which is behind the counter at the pharmacy.)  Rapid flu A/B and covid tests performed today in the office were negative. Patient felt in office test was not adequate. I did offer to test her again without charge, she declined. Let her know that if she tests at home and it turns positive to please let us know.  Please keep your follow up appointment with Dr. Maple Hudson.   Return to Care: Keep appointment with Dr. Maple Hudson.    Durel Salts, MD Pulmonary and Critical Care Medicine [facility] Office:[phone]

## 2024-01-22 NOTE — Progress Notes (Unsigned)
 HPI   Former smoker followed for Sarcoid, allergic rhinitis, allergic conjunctivitis angioedema,, complicated by GERD, cervical node,  ACE 05/2013- 56H Allergy profile 01/20/15- Neg, IgE 22 ACE level 01/20/15- stable at 53 ACE 05/16/20- 39 WNL ----------------------------------------------------------------------  10/22/22- 56 yoF  Former heavy / occasional smoker followed for Sarcoid, allergic rhinitis, allergic conjunctivitis, angioedema, complicated by GERD, cervical node, -Symbicort 160, neb albuterol, albuterol hfa, DM2, Covid vax- Flu vax- will return for standard Saw Allergist in October for Allergy testing> broadly negative>  Variable nasal stuffiness, sensitive to weather changes. We discussed vasomotor rhinitis and available nasal sprays. Little cough or wheeze. Asks to return for flu vax after upcoming cataract surgery. Also going to have eyelid nodule removed.. CXR 09/19/21- IMPRESSION: Similar appearance of scarring in the upper left lungs. No acute process in the chest.  01/23/24- 57 yoF  Former heavy / occasional smoker followed for Sarcoid, allergic rhinitis, allergic conjunctivitis, angioedema, complicated by GERD, cervical node, -Symbicort 160, neb albuterol, albuterol hfa, DM2, Saw Dr Celine Mans 3 days ago for URI managed conservatively as viral. Tested neg for flu/covid. Discussed the use of AI scribe software for clinical note transcription with the patient, who gave verbal consent to proceed. History of Present Illness   The patient presents with head congestion, particularly on the right side, which worsens when lying down. She reports no associated fever, earache, or sore throat, and her chest feels fine. She has been able to expel some green nasal discharge, which has since returned to a normal color. She has not been using her prescribed allergy nasal spray and is unsure of the over-the-counter medication previously recommended. She has not taken any antibiotics recently, instead  opting for natural remedies such as sea moss. She has  known allergies to antibiotics, and has previously had adverse reactions to penicillins and Cipro. She has not used a saline nose rinse before, but is open to trying it.     ROS-see HPI   + = positive Constitutional:    weight loss, night sweats, fevers, chills, fatigue, lassitude. HEENT:    headaches, difficulty swallowing, tooth/dental problems, sore throat,       sneezing, itching, ear ache, + nasal congestion, post nasal drip, snoring CV:    chest pain, orthopnea, PND, swelling in lower extremities, anasarca,                                                     dizziness, palpitations Resp:   shortness of breath with exertion or at rest.                + productive cough,   non-productive cough, coughing up of blood.              change in color of mucus.  wheezing.   Skin:    rash or lesions. GI:  No-   heartburn, indigestion, abdominal pain, nausea, vomiting, diarrhea,                 change in bowel habits, loss of appetite GU: dysuria, change in color of urine, no urgency or frequency.   flank pain. MS:   joint pain, stiffness, decreased range of motion, back pain. Neuro-     nothing unusual Psych:  change in mood or affect.  depression or anxiety.   memory loss.  OBJ- Physical  Exam General- Alert, Oriented, Affect-,  Distress- none acute, but uncomfortable Skin- +rubbery nodules in skin of upper R eyelid Lymphadenopathy- none Head- atraumatic            Eyes- Gross vision intact, PERRLA, conjunctivae and secretions clear            Ears- Hearing, canals-normal            Nose-  turbinate edema, no-Septal dev, mucus, polyps- none seen, erosion, perforation             Throat- Mallampati II , mucosa clear , drainage- none, tonsils- atrophic Neck- flexible , trachea midline, no stridor , thyroid nl, carotid no bruit Chest - symmetrical excursion , unlabored           Heart/CV- RRR , no murmur , no gallop  , no rub, nl s1 s2                            - JVD- none , edema- none, stasis changes- none, varices- none           Lung- clear to P&A, wheeze- none, cough- none , dullness-none, rub- none           Chest wall-  Abd-  Br/ Gen/ Rectal- Not done, not indicated Extrem- cyanosis- none, clubbing, none, atrophy- none, strength- nl Neuro- grossly intact to observation Assessment and Plan   Acute maxillary Sinusitis   Allergic Rhinitis Nasal Congestion Predominantly right-sided nasal congestion, worse when lying down. No associated fever, earache, or sore throat. Noted green nasal discharge. No recent antibiotics. -Prescribe Z-Pak (Azithromycin) to address potential bacterial sinusitis. -Recommend over-the-counter Flonase nasal spray for potential allergic component. -Advise use of Sudafed, available over-the-counter at the pharmacy counter, for additional decongestant effect. -Suggest use of a saline nasal rinse (e.g., navage) for symptomatic relief. -Consider an over-the-counter antihistamine such as Claritin or Allegra for potential allergic component esp later in spring.  Follow-up in 1 year, or sooner if symptoms persist or worsen.

## 2024-01-23 ENCOUNTER — Ambulatory Visit: Payer: 59 | Admitting: Internal Medicine

## 2024-01-23 ENCOUNTER — Encounter: Payer: Self-pay | Admitting: Internal Medicine

## 2024-01-23 VITALS — BP 120/84 | HR 81 | Temp 98.4°F | Ht 64.0 in | Wt 165.0 lb

## 2024-01-23 DIAGNOSIS — J0100 Acute maxillary sinusitis, unspecified: Secondary | ICD-10-CM

## 2024-01-23 MED ORDER — AZITHROMYCIN 250 MG PO TABS: ORAL_TABLET | ORAL | 0 refills | Status: DC

## 2024-01-23 NOTE — Patient Instructions (Signed)
 Script  sent for Zpak antibiotic  Try otc saline "navage" rinse when needed- such as "NeilMed"  For bad stuffy nose- try Sudafed (from pharmacy counter)  For allergic nose during pollen season- Flonase(fluticasone) otc  Also for allergic nose/itching/drainage- antihistamine tabs, like claritin or allegra

## 2024-02-19 ENCOUNTER — Other Ambulatory Visit: Payer: Self-pay | Admitting: Internal Medicine

## 2024-02-28 ENCOUNTER — Other Ambulatory Visit: Payer: Self-pay | Admitting: Family Medicine

## 2024-02-28 DIAGNOSIS — E2839 Other primary ovarian failure: Secondary | ICD-10-CM

## 2024-03-24 ENCOUNTER — Other Ambulatory Visit: Payer: Self-pay | Admitting: Nurse Practitioner

## 2024-03-24 DIAGNOSIS — N6310 Unspecified lump in the right breast, unspecified quadrant: Secondary | ICD-10-CM

## 2024-03-26 ENCOUNTER — Other Ambulatory Visit: Payer: Self-pay | Admitting: Internal Medicine

## 2024-03-27 ENCOUNTER — Ambulatory Visit (INDEPENDENT_AMBULATORY_CARE_PROVIDER_SITE_OTHER): Admitting: Internal Medicine

## 2024-03-27 ENCOUNTER — Encounter: Payer: Self-pay | Admitting: Internal Medicine

## 2024-03-27 ENCOUNTER — Ambulatory Visit: Payer: Self-pay | Admitting: Internal Medicine

## 2024-03-27 VITALS — BP 160/82 | HR 98 | Temp 98.0°F | Ht 64.0 in | Wt 154.6 lb

## 2024-03-27 DIAGNOSIS — J40 Bronchitis, not specified as acute or chronic: Secondary | ICD-10-CM | POA: Diagnosis not present

## 2024-03-27 DIAGNOSIS — Z87891 Personal history of nicotine dependence: Secondary | ICD-10-CM | POA: Diagnosis not present

## 2024-03-27 DIAGNOSIS — J00 Acute nasopharyngitis [common cold]: Secondary | ICD-10-CM

## 2024-03-27 MED ORDER — DOXYCYCLINE HYCLATE 100 MG PO TABS: 100.0000 mg | ORAL_TABLET | Freq: Two times a day (BID) | ORAL | 1 refills | Status: DC

## 2024-03-27 NOTE — Patient Instructions (Addendum)
 Script sent for doxycycline - 1 twice daily  Agree with saline navage for your nose

## 2024-03-27 NOTE — Telephone Encounter (Addendum)
 Copied from CRM [id number]. Topic: Clinical - Red Word Triage >> Mar 27, 2024 10:43 AM Evie Hoff wrote: [facility] that prompted transfer to Nurse Triage: dr young patient and have severe congestion with green snot concerned it may be pneumonia  TRIAGE SUMMARY NOTE: Pt requesting appt at pulm asap, refusing further triage than information nurse was able to gather. Pt reporting that she has been coughing, "peeing while coughing," SOB, chest and nasal congestion to green mucus. After reporting this initial information, pt stating repeatedly throughout triage that she wants to be done answering triage questions, nurse advised each time that this information helps us  determine if an appt is appropriate and when. Pt confirms SOB at rest and with exertion, no fever, having chest pain only while coughing, wheezing is intermittent, using albuterol  inhaler 2x/day and nebulizer 2x/day. Pt reporting her pulse ox today is 95% when normally at 99%, reporting she is currently at doc office for higher BP readings lately, pt mentions potential of symptomatic BP with headaches, but refuses to detail further about symptoms or specifics with BP readings. Advised that if BP is higher and symptomatic for headaches, blurred vision, or other symptoms, she needs to go to ED. Pt refusing further questions, requesting appt asap since in building right behind pulm office, alerted CAL, warm transferred to CAL with pt refusing further nurse triage, Randee Busing speaking with pt about appt options.  E2C2 Pulmonary Triage - Initial Assessment Questions "Chief Complaint (e.g., cough, sob, wheezing, fever, chills, sweat or additional symptoms) *Go to specific symptom protocol after initial questions. At another doc's appt refusing to have nurse call back Coughing, peeing while coughing Chest and nasal congestion to green mucus Breath is short because asthmatic I got some pneumonia Don't have time for questions When cough, chest pain only while  coughing Wheezing coming and going At doc appt currently because BP running high, you giving me a headache No fever You pushing me Can you just please finish these questions I done answered all the questions you need me to answer Refusing further questions  "How long have symptoms been present?" Refused further questioning  MEDICINES:   "Have you used any OTC meds to help with symptoms?" refused further questioning  "Have you used your inhalers/maintenance medication?" Yes If yes, "What medications?" Symbicort , albuterol  inhaler, albuterol  nebulizer  If inhaler, ask "How many puffs and how often?" Note: Review instructions on medication in the chart. [employer] can someone just schedule me Using inhaler 2x/day not every 6 hours Using nebulizer 2x/day  OXYGEN: "Do you wear supplemental oxygen?" No  "Do you monitor your oxygen levels?" Yes If yes, "What is your reading (oxygen level) today?" 95%  "What is your usual oxygen saturation reading?"  (Note: Pulmonary O2 sats should be 90% or greater) 99%  Reason for Disposition  [1] Angry or rude caller AND [2] doesn't respond to 5 minutes of triager counseling AND [3] sick adult (or caller)  Answer Assessment - Initial Assessment Questions Pt thinks she has pneumonia, SOB at rest and with exertion, no fever, wheezing, at other doc appt currently for high BP, reporting potential headache, refusing further questions.  Protocols used: Difficult Call-A-AH

## 2024-03-27 NOTE — Progress Notes (Signed)
 HPI   Former smoker followed for Sarcoid, allergic rhinitis, allergic conjunctivitis angioedema,, complicated by GERD, cervical node,  ACE 05/2013- 57H Allergy  profile 01/20/15- Neg, IgE 22 ACE level 01/20/15- stable at 53 ACE 05/16/20- 39 WNL ----------------------------------------------------------------------  10/22/22- 56 yoF  Former heavy / occasional smoker followed for Sarcoid, allergic rhinitis, allergic conjunctivitis, angioedema, complicated by GERD, cervical node, -Symbicort  160, neb albuterol , albuterol  hfa, DM2, Covid vax- Flu vax- will return for standard Saw Allergist in October for Allergy  testing> broadly negative>  Variable nasal stuffiness, sensitive to weather changes. We discussed vasomotor rhinitis and available nasal sprays. Little cough or wheeze. Asks to return for flu vax after upcoming cataract surgery. Also going to have eyelid nodule removed.. CXR 09/19/21- IMPRESSION: Similar appearance of scarring in the upper left lungs. No acute process in the chest.  01/23/24- 57 yoF  Former heavy / occasional smoker followed for Sarcoid, allergic rhinitis, allergic conjunctivitis, angioedema, complicated by GERD, cervical node, -Symbicort  160, neb albuterol , albuterol  hfa, DM2, Saw Dr Dione Franks 3 days ago for URI managed conservatively as viral. Tested neg for flu/covid. Discussed the use of AI scribe software for clinical note transcription with the patient, who gave verbal consent to proceed. History of Present Illness   The patient presents with head congestion, particularly on the right side, which worsens when lying down. She reports no associated fever, earache, or sore throat, and her chest feels fine. She has been able to expel some green nasal discharge, which has since returned to a normal color. She has not been using her prescribed allergy  nasal spray and is unsure of the over-the-counter medication previously recommended. She has not taken any antibiotics recently, instead  opting for natural remedies such as sea moss. She has  known allergies to antibiotics, and has previously had adverse reactions to penicillins and Cipro. She has not used a saline nose rinse before, but is open to trying it.   Assessment and Plan:   Acute maxillary Sinusitis   Allergic Rhinitis Nasal Congestion Predominantly right-sided nasal congestion, worse when lying down. No associated fever, earache, or sore throat. Noted green nasal discharge. No recent antibiotics. -Prescribe Z-Pak (Azithromycin ) to address potential bacterial sinusitis. -Recommend over-the-counter Flonase  nasal spray for potential allergic component. -Advise use of Sudafed, available over-the-counter at the pharmacy counter, for additional decongestant effect. -Suggest use of a saline nasal rinse (e.g., navage) for symptomatic relief. -Consider an over-the-counter antihistamine such as Claritin or Allegra for potential allergic component esp later in spring.  Follow-up in 1 year, or sooner if symptoms persist or worsen.   03/27/24- 71 yoF  Former heavy / occasional smoker followed for Sarcoid, allergic rhinitis, allergic conjunctivitis, angioedema, complicated by GERD, cervical node, -Symbicort  160, neb albuterol , albuterol  hfa, DM2, C/O 2 days head congestion, ears popping, chest congestion. Little sputum. No fever. We discussed management options/ URI vs early sinusitis, etc. History of Present Illness   ROS-see HPI   + = positive Constitutional:    weight loss, night sweats, fevers, chills, fatigue, lassitude. HEENT:    headaches, difficulty swallowing, tooth/dental problems, sore throat,       sneezing, itching, ear ache, + nasal congestion, post nasal drip, snoring CV:    chest pain, orthopnea, PND, swelling in lower extremities, anasarca,  dizziness, palpitations Resp:   shortness of breath with exertion or at rest.                + productive cough,    +non-productive cough, coughing up of blood.              change in color of mucus.  wheezing.   Skin:    rash or lesions. GI:  No-   heartburn, indigestion, abdominal pain, nausea, vomiting, diarrhea,                 change in bowel habits, loss of appetite GU: dysuria, change in color of urine, no urgency or frequency.   flank pain. MS:   joint pain, stiffness, decreased range of motion, back pain. Neuro-     nothing unusual Psych:  change in mood or affect.  depression or anxiety.   memory loss.  OBJ- Physical Exam General- Alert, Oriented, Affect-,  Distress- none acute, but uncomfortable Skin- +rubbery nodules in skin of upper R eyelid Lymphadenopathy- none Head- atraumatic            Eyes- Gross vision intact, PERRLA, conjunctivae and secretions clear            Ears- Hearing, canals-normal            Nose-  turbinate edema, no-Septal dev, mucus, polyps- none seen, erosion, perforation             Throat- Mallampati II , mucosa clear , drainage- none, tonsils- atrophic Neck- flexible , trachea midline, no stridor , thyroid  nl, carotid no bruit Chest - symmetrical excursion , unlabored           Heart/CV- RRR , no murmur , no gallop  , no rub, nl s1 s2                           - JVD- none , edema- none, stasis changes- none, varices- none           Lung- clear to P&A, wheeze- none, cough- none , dullness-none, rub- none           Chest wall-  Abd-  Br/ Gen/ Rectal- Not done, not indicated Extrem- cyanosis- none, clubbing, none, atrophy- none, strength- nl Neuro- grossly intact to observation

## 2024-03-27 NOTE — Telephone Encounter (Signed)
Dr. Young, please advise. Thanks!  

## 2024-03-31 ENCOUNTER — Encounter: Payer: Self-pay | Admitting: Nurse Practitioner

## 2024-03-31 ENCOUNTER — Ambulatory Visit: Admitting: Nurse Practitioner

## 2024-03-31 DIAGNOSIS — Z113 Encounter for screening for infections with a predominantly sexual mode of transmission: Secondary | ICD-10-CM

## 2024-03-31 LAB — WET PREP FOR TRICH, YEAST, CLUE
Clue Cell Exam: NEGATIVE
Trichomonas Exam: NEGATIVE
Yeast Exam: NEGATIVE

## 2024-03-31 LAB — HM HIV SCREENING LAB: HM HIV Screening: NEGATIVE

## 2024-03-31 LAB — HM HEPATITIS C SCREENING LAB: HM Hepatitis Screen: NEGATIVE

## 2024-03-31 NOTE — Progress Notes (Signed)
 [facility] Department STI clinic [address] [place] [address] Main phone: [phone]  STI screening visit  Subjective:  Stephanie Stephens is a 58 y.o. female being seen today for an STI screening visit. The patient reports they do have symptoms.  Patient reports that they do not desire a pregnancy in the next year.   They reported they are not interested in discussing contraception today.    No LMP recorded. Patient has had a hysterectomy.  Patient has the following medical conditions:  Patient Active Problem List   Diagnosis Date Noted   Toxic effect of venom of bees, unintentional 07/18/2022   Allergy  with anaphylaxis due to food 07/18/2022   Pollen-food allergy  07/18/2022   Latex allergy  07/18/2022   History of penicillin allergy  07/18/2022   Parotitis 02/28/2021   Primary hyperparathyroidism (HCC) 02/28/2021   Tobacco dependence 02/28/2021   Asthma, mild intermittent 07/27/2020   Hypercalcemia 05/20/2020   Prediabetes 05/06/2020   Tobacco user 09/29/2019   Allergic rhinitis due to pollen 10/28/2018   Halitosis 10/28/2018   Lesion of pharynx 10/28/2018   Closed fracture of great toe 07/23/2018   Pain of toe of left foot 07/23/2018   Food allergy  04/15/2017   Anxiety 02/15/2015   Depression 02/15/2015   CAP (community acquired pneumonia) 10/02/2013   GERD 09/04/2010   PHARYNGITIS, ACUTE 03/15/2009   BRONCHITIS 08/24/2008   ALLERGIC CONJUNCTIVITIS 01/27/2008   PULMONARY SARCOIDOSIS 10/03/2007   Allergic conjunctivitis and rhinitis 10/03/2007   Asthma, mild persistent 10/03/2007   Chief Complaint  Patient presents with   SEXUALLY TRANSMITTED DISEASE    HPI Patient is a pleasant 58 y.o. female who presents to the office today requesting symptomatic STI testing. Patient reports symptoms include genital itching for 4 days. Reports taking baking soda baths with minimal relief. Also noticed increased vaginal discharge and  doesn't know color or if odor is present. Endorses dysuria with dark urine color and low back pain. States she is not sure if she has had a fever, because she takes Tylenol  for other symptoms and that may be hiding the fever. Reports a bump to her external vaginal labia that has been present for 5-6 months. She thinks the bump is an ingrown hair, because she used to shave the area and a provider told her to stop due to the presence of ingrown hairs. Lastly, a bump  to the left buttocks near the rectum that she is unsure how long has been present.  Patient indicates 1 female partner in the last 2 months. She reports practicing vaginal, oral, and anal sex and does not uses condoms. Patient indicates STI history of gonorrhea, chlamydia, and trich. Patient reports last sex was 11 days ago. She indicates hysterectomy and does not have periods.   See flowsheet for further details and programmatic requirements Hyperlink available at the top of the signed note in blue.  Flow sheet content below:  Pregnancy Intention Screening Does the patient want to become pregnant in the next year?: No Does the patient's partner want to become pregnant in the next year?: No Would the patient like to discuss contraceptive options today?: No Reason For STD Screen STD Screening: Has symptoms Have you ever had an STD?: Yes History of Antibiotic use in the past 2 weeks?: Yes STD Symptoms Denies all: No Genital Itching: Yes Genital Itch s/s: Began 4 days ago, takes baking soda bath with little relief Lower abdominal pain: Yes Discharge: Yes Discharge s/s: 4 days, increased  amount, no odor Dysuria: Yes Dysuria s/s: With back pain, dark color, concerned it is blood, unsure if fever, taking tylenol  for other symptoms Genital ulcer / lesion: Yes Genital lesion s/s: on "butt check", and external vagina also present 5-6 months Rash: No Vaginal irritation: Yes Oral / Other skin ulcer: No Pain with sex: No Sore Throat:  No Visual Changes: No Vaginal Bleeding: No Other (Describe in Comments): No Risk Factors for Hep B Household, sexual, or needle sharing contact of a person infected with Hep B: No Sexual contact with a person who uses drugs not as prescribed?: No Currently or Ever used drugs not as prescribed: No HIV Positive: No PRep Patient: No Men who have sex with men: N/A Have Hepatitis C: No History of Incarceration: No History of Homeslessness?: No Anal sex following anal drug use?: No Risk Factors for Hep C Currently using drugs not as prescribed: No Sexual partner(s) currently using drugs as not prescribed: No History of drug use: Yes HIV Positive: No People with a history of incarceration: No People born between the years of 63 and 90: No Hepatitis Counseling Hep C Counseling: Counseled patient about increased risk of Hep C and recommendation for testing, Patient accepts testing for Hep C today Abuse History Has patient ever been abused physically?: No Has patient ever been abused sexually?: No Does patient feel they have a problem with Anxiety?: No Does patient feel they have a problem with Depression?: No Referral to Behavioral Health: N/A Counseling Patient counseled to use condoms with all sex: Condoms given RTC in 2-3 weeks for test results: Yes Clinic will call if test results abnormal before test result appt.: Yes Test results given to patient Patient counseled to use condoms with all sex: Condoms given Contraception Wrap Up Current Method: Female Sterilization End Method: Female Sterilization Contraception Counseling Provided: No How was the end contraceptive method provided?: N/A   Screening for MPX risk: Does the patient have an unexplained rash? No Is the patient MSM? No Does the patient endorse multiple sex partners or anonymous sex partners? No Did the patient have close or sexual contact with a person diagnosed with MPX? No Has the patient traveled outside  the [place]  where MPX is endemic? No Is there a high clinical suspicion for MPX-- evidenced by one of the following No  -Unlikely to be chickenpox  -Lymphadenopathy  -Rash that present in same phase of evolution on any given body part  Screenings: Last HIV test per patient/review of record was  Lab Results  Component Value Date   HMHIVSCREEN Negative - Validated 08/14/2021    Lab Results  Component Value Date   HIV NON REAC 03/08/2009     Last HEPC test per patient/review of record was  Lab Results  Component Value Date   HMHEPCSCREEN Negative-Validated 08/14/2021   No components found for: "HEPC"   Last HEPB test per patient/review of record was No components found for: "HMHEPBSCREEN"   Patient reports last pap was:   No results found for: "SPECADGYN" No Cervical Cancer Screening results to display.  Immunization history:  Immunization History  Administered Date(s) Administered   Hepatitis B 12/14/1996   Influenza Split 08/20/2011, 09/19/2012, 08/19/2013, 08/24/2015   Influenza Whole 09/15/2009, 09/04/2010   Influenza,inj,Quad PF,6+ Mos 08/16/2014, 08/19/2016, 09/19/2021   Influenza-Unspecified 12/22/2012, 07/20/2014   Moderna Sars-Covid-2 Vaccination 09/15/2020, 10/21/2020   PPD Test 02/04/2013, 04/06/2022   Pneumococcal Polysaccharide-23 11/20/2007, 01/13/2015   Tdap 01/13/2015    The following portions of the patient's history  were reviewed and updated as appropriate: allergies, current medications, past medical history, past social history, past surgical history and problem list.  Objective:  There were no vitals filed for this visit.  Physical Exam Nursing note reviewed. Chaperone present: Declined.  Constitutional:      Appearance: Normal appearance.  HENT:     Head: Normocephalic.     Salivary Glands: Right salivary gland is not diffusely enlarged or tender. Left salivary gland is not diffusely enlarged or tender.     Mouth/Throat:     Lips: Pink. No lesions.      Mouth: Mucous membranes are moist.     Tongue: No lesions. Tongue does not deviate from midline.     Pharynx: Oropharynx is clear. Uvula midline.     Tonsils: No tonsillar exudate.  Eyes:     General:        Right eye: No discharge.        Left eye: No discharge.  Pulmonary:     Effort: Pulmonary effort is normal.  Genitourinary:    General: Normal vulva.     Exam position: Lithotomy position.     Pubic Area: No rash or pubic lice.      Tanner stage (genital): 5.     Labia:        Right: No rash, tenderness, lesion or injury.        Left: No rash, tenderness, lesion or injury.      Vagina: Normal. No vaginal discharge, erythema, tenderness, bleeding or lesions.     Cervix: Normal. No cervical motion tenderness, discharge, friability, lesion, erythema, cervical bleeding or eversion.     Uterus: Normal.      Adnexa: Right adnexa normal and left adnexa normal.       Comments: No abnormal discharge present.  Lymphadenopathy:     Head:     Right side of head: No submental, submandibular, tonsillar, preauricular or posterior auricular adenopathy.     Left side of head: No submental, submandibular, tonsillar, preauricular or posterior auricular adenopathy.     Cervical: No cervical adenopathy.     Right cervical: No superficial or posterior cervical adenopathy.    Left cervical: No superficial or posterior cervical adenopathy.     Upper Body:     Right upper body: No supraclavicular or axillary adenopathy.     Left upper body: No supraclavicular or axillary adenopathy.     Lower Body: No right inguinal adenopathy. No left inguinal adenopathy.  Skin:    General: Skin is warm and dry.     Findings: No lesion or rash.     Comments: Skin tone appropriate for ethnicity.   Neurological:     Mental Status: She is alert and oriented to person, place, and time.  Psychiatric:        Attention and Perception: Attention and perception normal.        Mood and Affect: Mood and affect  normal.        Speech: Speech normal.        Behavior: Behavior normal. Behavior is cooperative.        Thought Content: Thought content normal.     Assessment and Plan:  Stephanie Stephens is a 58 y.o. female presenting to the [facility] Department for STI screening  1. Screening for venereal disease (Primary) Wet Prep negative in office today. Will await additional testing to determine if treatment is needed at this time.  - Chlamydia/Gonorrhea [place] [facility] - Gonococcus  culture - HBV Antigen/Antibody [facility] - HIV/HCV [place] [facility] - WET PREP FOR TRICH, YEAST, CLUE - Syphilis Serology, [place] [facility] - Gonococcus culture   Patient accepted the following screenings: oral GC culture, vaginal CT/GC swabs, vaginal wet prep, HIV, RPR, Hep B, and Hep C, and rectal GC swab.  Patient meets criteria for HepB screening? Yes. Ordered? yes Patient meets criteria for HepC screening? Yes. Ordered? yes  Treat wet prep per standing order Discussed time line for State Lab results and that patient will be called with positive results and encouraged patient to call if she had not heard in 2 weeks.  Counseled to return or seek care for continued or worsening symptoms Recommended repeat testing in 3 months with positive results. Recommended condom use with all sex for STI prevention.   Patient is currently using Sterilization for Men and Women to prevent pregnancy.    Return if symptoms worsen or fail to improve.  Future Appointments  Date Time Provider Department Center  04/08/2024  8:50 AM [facility] DIAG TOMO 2 [facility]  04/08/2024  9:00 AM [facility] US  2 [facility]  11/10/2024 10:30 AM [facility] DX DEXA 1 [facility]   Total time with patient 30 minutes.   Merleen Stare, NP

## 2024-04-01 ENCOUNTER — Telehealth: Payer: Self-pay | Admitting: Family Medicine

## 2024-04-01 NOTE — Telephone Encounter (Signed)
 Patient left a message asking if her results had been received yet or sent for the next step, curious if they were sent due to her leaving right after having the blood drawn

## 2024-04-02 LAB — HBV ANTIGEN/ANTIBODY STATE LAB
Hep B Core Total Ab: NONREACTIVE
Hep B S Ab: REACTIVE

## 2024-04-05 LAB — GONOCOCCUS CULTURE

## 2024-04-08 ENCOUNTER — Encounter: Payer: Self-pay | Admitting: Nurse Practitioner

## 2024-04-08 ENCOUNTER — Ambulatory Visit
Admission: RE | Admit: 2024-04-08 | Discharge: 2024-04-08 | Disposition: A | Discharge status: Home / Self Care | Source: Ambulatory Visit | Attending: Nurse Practitioner | Admitting: Nurse Practitioner

## 2024-04-08 ENCOUNTER — Other Ambulatory Visit: Payer: Self-pay | Admitting: Nurse Practitioner

## 2024-04-08 DIAGNOSIS — N6310 Unspecified lump in the right breast, unspecified quadrant: Secondary | ICD-10-CM

## 2024-04-18 ENCOUNTER — Encounter: Payer: Self-pay | Admitting: Internal Medicine

## 2024-04-18 DIAGNOSIS — J00 Acute nasopharyngitis [common cold]: Secondary | ICD-10-CM | POA: Insufficient documentation

## 2024-04-18 NOTE — Assessment & Plan Note (Signed)
 Nasal saline lavage, otc meds

## 2024-04-18 NOTE — Assessment & Plan Note (Signed)
Doxycycline

## 2024-04-20 ENCOUNTER — Telehealth: Payer: Self-pay | Admitting: Family Medicine

## 2024-04-20 NOTE — Telephone Encounter (Signed)
 Completed - Informed pt of results, all questions answered.

## 2024-04-23 ENCOUNTER — Encounter: Payer: Self-pay | Admitting: Gastroenterology

## 2024-05-13 ENCOUNTER — Encounter: Payer: Self-pay | Admitting: Gastroenterology

## 2024-05-13 ENCOUNTER — Ambulatory Visit (AMBULATORY_SURGERY_CENTER): Admitting: *Deleted

## 2024-05-13 VITALS — Ht 64.0 in | Wt 157.0 lb

## 2024-05-13 DIAGNOSIS — Z860100 Personal history of colon polyps, unspecified: Secondary | ICD-10-CM

## 2024-05-13 DIAGNOSIS — Z800 Family history of malignant neoplasm of digestive organs: Secondary | ICD-10-CM

## 2024-05-13 NOTE — Progress Notes (Signed)
  Pre visit completed over telephone . Instructions mailed to home address. Patient had suprep from a previously cancelled colonoscopy.   No egg or soy allergy  known to patient  No issues known to pt with past sedation with any surgeries or procedures Patient denies ever being told they had issues or difficulty with intubation  No FH of Malignant Hyperthermia Pt is not on diet pills Pt is not on  home 02  Pt is not on blood thinners  Pt denies issues with constipation  No A fib or A flutter Have any cardiac testing pending-- NO Pt instructed to use [email or web address] or GoodRx for a price reduction on prep

## 2024-06-15 ENCOUNTER — Telehealth: Payer: Self-pay | Admitting: Gastroenterology

## 2024-06-15 NOTE — Telephone Encounter (Signed)
 This is unfortunate, I think we could get her through this for her exam today, she already had an AM appointment the difference would have only been a few hours. She unfortunately should be charged a late cancellation fee given this occurrence.

## 2024-06-15 NOTE — Telephone Encounter (Signed)
 Returned pt's call. She states her blood sugar dropped to 69 and did not improve with apple juice. Also was drinking broth to help with not eating and that made her blood pressure go up. She ate food before RN was able to return her call. RN educated pt that she could have drank regular soda as well to see if that increased her blood sugar. Pt is pre-diabetic and checks her blood sugar daily, not on any diabetic medications. RN offered to pt to stay on clear liquids from now until procedure and try to take the prep to see if she gets cleaned out adequately. She states she would rather reschedule to an earlier morning appointment so she doesn't have to go so long without eating. RN reminded pt that she would still have to be on clear liquids for the entire day before the procedure regardless of the appointment time. Pt still wished to reschedule. Pt also told RN she had some other social stressors going on today that she was trying to deal with. Pt rescheduled and scheduled with a pre-visit nurse call.

## 2024-06-15 NOTE — Telephone Encounter (Signed)
 Inbound call from patient stating her blood sugar has been dropping. States she has been drinking apple juice and broth but has not helped and also made her blood pressure go up. Patient is unsure how to proceed for the rest of the day. Procedure is scheduled for tomorrow 7/29. Please advise, thank you,

## 2024-06-16 ENCOUNTER — Encounter: Admitting: Gastroenterology

## 2024-07-01 ENCOUNTER — Other Ambulatory Visit: Payer: Self-pay | Admitting: Internal Medicine

## 2024-07-14 ENCOUNTER — Encounter: Payer: Self-pay | Admitting: Gastroenterology

## 2024-07-14 ENCOUNTER — Ambulatory Visit (AMBULATORY_SURGERY_CENTER): Admitting: *Deleted

## 2024-07-14 VITALS — Ht 64.0 in | Wt 150.0 lb

## 2024-07-14 DIAGNOSIS — Z800 Family history of malignant neoplasm of digestive organs: Secondary | ICD-10-CM

## 2024-07-14 DIAGNOSIS — Z83719 Family history of colon polyps, unspecified: Secondary | ICD-10-CM

## 2024-07-14 DIAGNOSIS — Z860100 Personal history of colon polyps, unspecified: Secondary | ICD-10-CM

## 2024-07-14 MED ORDER — NA SULFATE-K SULFATE-MG SULF 17.5-3.13-1.6 GM/177ML PO SOLN: 1.0000 | Freq: Once | ORAL | 0 refills | Status: AC

## 2024-07-14 NOTE — Progress Notes (Signed)
 Pt's name and DOB verified at the beginning of the pre-visit with 2 identifiers  Pt denies any difficulty with ambulating,sitting, laying down or rolling side to side  Pt has no issues moving head neck or swallowing  No egg or soy allergy  known to patient    Pt woke up during tonsillectomy   No FH of Malignant Hyperthermia  Pt is not on home 02   Pt is not on blood thinners   Pt denies issues with constipation   Pt is not on dialysis  Pt states she has an irregular hr  Pt denies any upcoming cardiac testing  Patient's chart reviewed by Norleen Schillings CNRA prior to pre-visit and patient appropriate for the [facility].  Pre-visit completed and red dot placed by patient's name on their procedure day (on provider's schedule).   Visit by phone  Pt states weight is 150 lb   Pt given  both [facility] main # and MD on call # prior to instructions.    Informed pt to come in at the time discussed and is shown on PV instructions to allow time to get ready for procedure.   Instructed pt on all aspects of written instructions including med holds, clothing to wear and foods to eat and not eat as well as after procedure legal restrictions and to cal MD on call if needed.. Pt states understanding.  Instructed pt to review instructions again prior to procedure and call main # given if has any questions.. Pt states they will.  Do not smoke Marijuana day before and day of procedure. Pt states she will. If you use chewing tobacco or snuff, please DO NOT use after midnight the day of your procedure. If you do, your procedure will be cancelled or delayed!!  Instructed pt to call office if has not gotten copy of instructions to call main # given.

## 2024-07-29 ENCOUNTER — Telehealth: Payer: Self-pay | Admitting: Gastroenterology

## 2024-07-29 NOTE — Telephone Encounter (Signed)
 Inbound call from patient stating that she is scheduled to have a colonoscopy tomorrow at 7:30. She states that her friend was over on Saturday night and she now has COVID. Patient is on the way to a walk in clinic to be tested. Requesting a call back to discuss. Please advise.

## 2024-07-29 NOTE — Telephone Encounter (Addendum)
 Pt stated that she had night sweats, and chills x1 day, runny nose, and cough. Denies chest pain, and did not check temp for a fever. Asked if she had any shortness of breath and she stated she always have that due to asthma. Pt is going to walk-in clinic to get tested today. Advised pt that because she is having symptoms we may need to reschedule pending test results. Pt opted to reschedule procedure due to having symptoms and not wanting to get anyone in [facility] sick as a precaution. Pt rescheduled to 10/21 at 7am. Went over instructions with pt. She verbalized understanding and had no further concerns.

## 2024-07-30 ENCOUNTER — Encounter: Admitting: Gastroenterology

## 2024-07-30 NOTE — Telephone Encounter (Signed)
 Okay got it, thanks for letting me know

## 2024-08-10 ENCOUNTER — Other Ambulatory Visit: Payer: Self-pay | Admitting: Family Medicine

## 2024-08-10 DIAGNOSIS — Z1231 Encounter for screening mammogram for malignant neoplasm of breast: Secondary | ICD-10-CM

## 2024-08-12 NOTE — Progress Notes (Addendum)
 HPI   Former smoker followed for Sarcoid, allergic rhinitis, allergic conjunctivitis angioedema,, complicated by GERD, cervical node,  ACE 05/2013- 57H Allergy  profile 01/20/15- Neg, IgE 22 ACE level 01/20/15- stable at 53 ACE 05/16/20- 39 WNL ----------------------------------------------------------------------   03/27/24- 57 yoF  Former heavy / occasional smoker followed for Sarcoid, allergic rhinitis, allergic conjunctivitis, angioedema, complicated by GERD, cervical node, -Symbicort  160, neb albuterol , albuterol  hfa, DM2, C/O 2 days head congestion, ears popping, chest congestion. Little sputum. No fever. We discussed management options/ URI vs early sinusitis, etc.  08/13/24- 57 yoF  Former heavy / occasional smoker followed for Sarcoid, allergic rhinitis, allergic conjunctivitis, angioedema, complicated by GERD, cervical node, DM2,  -Symbicort  160, neb albuterol , albuterol  hfa,  LOV had URI/ bronchitis Discussed the use of AI scribe software for clinical note transcription with the patient, who gave verbal consent to proceed.  History of Present Illness   Stephanie Stephens is a 58 year old female with sarcoidosis who presents with persistent nasal congestion and weakness.  She experiences persistent nasal congestion since May, primarily in her head, with a sensation of tightness. Despite nasal breathing during sleep, she has excessive nocturnal salivation. She feels weak . She is taking Valacyclovir for shingles, which is improving, but has difficulty swallowing the pills due to their size. She is undergoing a review for Social Security disability related to lung and breathing issues and has a history of treatment at [facility].     She reports her breathing and over-all status have not changed, certainly not improved.  Assessment and Plan:    Chronic rhinitis Persistent nasal congestion and head tightness since May. No wheezing or chest rattling. Says she has had several  antibiotics this summer without effect on her sinus pressure. Little nasal discharge. - Order chest x-ray to evaluate respiratory status. -Ok to use Flonase   Sarcoidosis, unspecified Previous ultrasound showed chest soft tissue involvement, no current issues. - Order basic lab work to assess status of sarcoidosis. - No change recognized that would change her SS Disability status.  Generalized weakness, under evaluation Reports weakness. Last blood work done last month. - Order basic lab work to evaluate causes of generalized weakness.   Addendum 09/14/24- Needs to resume using her nebulizer machine for sarcoid bronchitis and COPD mixed type. Needs mouth pieces, hose, filter and probably needs refill Duoneb to use up to every 6 hours when needed.[name]     ROS-see HPI   + = positive Constitutional:    weight loss, night sweats, fevers, chills, fatigue, lassitude. HEENT:    headaches, difficulty swallowing, tooth/dental problems, sore throat,       sneezing, itching, ear ache, + nasal congestion, post nasal drip, snoring CV:    chest pain, orthopnea, PND, swelling in lower extremities, anasarca,                                                     dizziness, palpitations Resp:   shortness of breath with exertion or at rest.                 productive cough,   +non-productive cough, coughing up of blood.              change in color of mucus.  wheezing.   Skin:    rash or lesions. GI:  No-  heartburn, indigestion, abdominal pain, nausea, vomiting, diarrhea,                 change in bowel habits, loss of appetite GU: dysuria, change in color of urine, no urgency or frequency.   flank pain. MS:   joint pain, stiffness, decreased range of motion, back pain. Neuro-     nothing unusual Psych:  change in mood or affect.  depression or anxiety.   memory loss.  OBJ- Physical Exam General- Alert, Oriented, Affect-,  Distress- none acute, but uncomfortable Skin- +rubbery nodules in skin of upper R  eyelid Lymphadenopathy- none Head- atraumatic            Eyes- Gross vision intact, PERRLA, conjunctivae and secretions clear            Ears- Hearing, canals-normal            Nose-  turbinate edema, no-Septal dev, mucus, polyps- none seen, erosion, perforation             Throat- Mallampati II , mucosa clear , drainage- none, tonsils- atrophic Neck- flexible , trachea midline, no stridor , thyroid  nl, carotid no bruit Chest - symmetrical excursion , unlabored           Heart/CV- RRR , no murmur , no gallop  , no rub, nl s1 s2                           - JVD- none , edema- none, stasis changes- none, varices- none           Lung- clear to P&A, wheeze- none, cough- none , dullness-none, rub- none           Chest wall- +She doesn't demonstrate any soft tissue lump/ nodule today Abd-  Br/ Gen/ Rectal- Not done, not indicated Extrem- cyanosis- none, clubbing, none, atrophy- none, strength- nl Neuro- grossly intact to observation

## 2024-08-13 ENCOUNTER — Ambulatory Visit

## 2024-08-13 ENCOUNTER — Encounter: Payer: Self-pay | Admitting: Internal Medicine

## 2024-08-13 ENCOUNTER — Ambulatory Visit: Admitting: Internal Medicine

## 2024-08-13 VITALS — BP 138/84 | HR 98 | Ht 64.0 in | Wt 151.0 lb

## 2024-08-13 DIAGNOSIS — D869 Sarcoidosis, unspecified: Secondary | ICD-10-CM | POA: Diagnosis not present

## 2024-08-13 DIAGNOSIS — R531 Weakness: Secondary | ICD-10-CM | POA: Diagnosis not present

## 2024-08-13 DIAGNOSIS — J40 Bronchitis, not specified as acute or chronic: Secondary | ICD-10-CM

## 2024-08-13 DIAGNOSIS — J310 Chronic rhinitis: Secondary | ICD-10-CM | POA: Diagnosis not present

## 2024-08-13 LAB — COMPREHENSIVE METABOLIC PANEL WITH GFR
ALT: 14 U/L (ref 0–35)
AST: 23 U/L (ref 0–37)
Albumin: 4.8 g/dL (ref 3.5–5.2)
Alkaline Phosphatase: 89 U/L (ref 39–117)
BUN: 11 mg/dL (ref 6–23)
CO2: 28 meq/L (ref 19–32)
Calcium: 11.2 mg/dL — ABNORMAL HIGH (ref 8.4–10.5)
Chloride: 102 meq/L (ref 96–112)
Creatinine, Ser: 1.04 mg/dL (ref 0.40–1.20)
GFR: 59.77 mL/min — ABNORMAL LOW (ref >60.00)
Glucose, Bld: 80 mg/dL (ref 70–99)
Potassium: 4.1 meq/L (ref 3.5–5.1)
Sodium: 137 meq/L (ref 135–145)
Total Bilirubin: 0.6 mg/dL (ref 0.2–1.2)
Total Protein: 8 g/dL (ref 6.0–8.3)

## 2024-08-13 LAB — CBC WITH DIFFERENTIAL/PLATELET
Basophils Absolute: 0 K/uL (ref 0.0–0.1)
Basophils Relative: 0.3 % (ref 0.0–3.0)
Eosinophils Absolute: 0 K/uL (ref 0.0–0.7)
Eosinophils Relative: 0.7 % (ref 0.0–5.0)
HCT: 42.8 % (ref 36.0–46.0)
Hemoglobin: 14.2 g/dL (ref 12.0–15.0)
Lymphocytes Relative: 32.5 % (ref 12.0–46.0)
Lymphs Abs: 2.1 K/uL (ref 0.7–4.0)
MCHC: 33.1 g/dL (ref 30.0–36.0)
MCV: 94.5 fl (ref 78.0–100.0)
Monocytes Absolute: 0.5 K/uL (ref 0.1–1.0)
Monocytes Relative: 8 % (ref 3.0–12.0)
Neutro Abs: 3.7 K/uL (ref 1.4–7.7)
Neutrophils Relative %: 58.5 % (ref 43.0–77.0)
Platelets: 321 K/uL (ref 150.0–400.0)
RBC: 4.53 Mil/uL (ref 3.87–5.11)
RDW: 13.1 % (ref 11.5–15.5)
WBC: 6.4 K/uL (ref 4.0–10.5)

## 2024-08-13 NOTE — Patient Instructions (Signed)
 Order- CXR   dx Sarcoid  Order- labs- CBC w diff, CMET, Angiotensin Converting Enzyme level  dx Sarcoid

## 2024-08-14 ENCOUNTER — Encounter: Payer: Self-pay | Admitting: Internal Medicine

## 2024-08-14 ENCOUNTER — Ambulatory Visit: Payer: Self-pay | Admitting: Internal Medicine

## 2024-08-14 LAB — ANGIOTENSIN CONVERTING ENZYME: Angiotensin-Converting Enzyme: 35 U/L (ref 9–67)

## 2024-08-24 ENCOUNTER — Ambulatory Visit: Payer: Self-pay | Admitting: Internal Medicine

## 2024-08-24 NOTE — Telephone Encounter (Signed)
 FYI Only or Action Required?: Action required by provider: update on patient condition.  Patient is followed in Pulmonology for sarcoidosis, last seen on 08/13/2024 by Neysa Reggy BIRCH, MD.  Called Nurse Triage reporting Back Pain.  Symptoms began 2 weeks ago.  Interventions attempted: OTC medications: Tylenol .  Symptoms are: gradually worsening.  Triage Disposition: See HCP Within 4 Hours (Or PCP Triage)  Patient/caregiver understands and will follow disposition?: Yes--patient currently driving to Urgent Care to be evaluated and wanted her Pulmonary provider Dr Neysa to know        Copied from CRM [id number]. Topic: Clinical - Red Word Triage >> Aug 24, 2024  3:41 PM Corean SAUNDERS wrote: Red Word that prompted transfer to Nurse Triage: Severe back pain Reason for Disposition  [1] SEVERE back pain (e.g., excruciating, unable to do any normal activities) AND [2] not improved 2 hours after pain medicine  Answer Assessment - Initial Assessment Questions Patient currently driving on her way to [facility] at this time and just wanted to let Dr Neysa know that she was going there. She states that she has been having pain in her upper back bilaterally for two weeks off and on & she is also having left knee pain that she wants checked out that has also been going on for two weeks./ She states that it does hurt with a deep breath and it is non radiating. She denies any other symptoms such as chest pain, difficulty breathing, any known fevers, any injuries. Patient is advised to call us  if anything changes Patient is advised that if anything worsens to go to the Emergency Room. Patient verbalized understanding.   1. ONSET: When did the pain begin? (e.g., minutes, hours, days)     2 weeks 2. LOCATION: Where does it hurt? (upper, mid or lower back)     Upper back 3. SEVERITY: How bad is the pain?  (e.g., Scale 1-10; mild, moderate, or severe)     6-7 4. PATTERN: Is the pain  constant? (e.g., yes, no; constant, intermittent)      Off and on  5. RADIATION: Does the pain shoot into your legs or somewhere else?     Non radiating 6. CAUSE:  What do you think is causing the back pain?      unsure 8. MEDICINES: What have you taken so far for the pain? (e.g., nothing, acetaminophen , NSAIDS)     Tylenol  10. OTHER SYMPTOMS: Do you have any other symptoms? (e.g., fever, abdomen pain, burning with urination, blood in urine)       Left knee pain x 2 weeks  Protocols used: Back Pain-A-AH

## 2024-08-24 NOTE — Telephone Encounter (Signed)
 Noted. Nothing further needed.

## 2024-08-25 ENCOUNTER — Telehealth: Payer: Self-pay | Admitting: Internal Medicine

## 2024-08-25 NOTE — Telephone Encounter (Signed)
 Stephanie Stephens Stephanie Stephens; [place], Sherlean LENORA Joylene Adine; Tucker, Dolanda; Cain, Mitchell; 1 other Please have provider add to RX how ONO is to be done.  thank you

## 2024-08-27 NOTE — Telephone Encounter (Signed)
 Order for overnight oximetry on 9/25 was to read- overnight oximetry on room air    dx sarcoid

## 2024-08-28 ENCOUNTER — Other Ambulatory Visit: Payer: Self-pay

## 2024-08-28 DIAGNOSIS — J40 Bronchitis, not specified as acute or chronic: Secondary | ICD-10-CM

## 2024-08-28 DIAGNOSIS — D869 Sarcoidosis, unspecified: Secondary | ICD-10-CM

## 2024-08-28 NOTE — Telephone Encounter (Signed)
 Will send an orders only encounter with a new ONO order with correct documentation as overnight oximetry on room air .

## 2024-08-28 NOTE — Telephone Encounter (Signed)
 I have replied to [employer]. Letting them know the info. Thanks!

## 2024-08-28 NOTE — Telephone Encounter (Signed)
 Stephanie Stephens  [place], Sherlean LENORA Stephanie Stephens; Joylene Cain; Tucker, Dolanda; Cain, Mitchell; 1 other Can we have provider update the RX?

## 2024-09-04 ENCOUNTER — Ambulatory Visit
Admission: RE | Admit: 2024-09-04 | Discharge: 2024-09-04 | Disposition: A | Discharge status: Home / Self Care | Source: Ambulatory Visit | Attending: Family Medicine | Admitting: Family Medicine

## 2024-09-04 DIAGNOSIS — Z1231 Encounter for screening mammogram for malignant neoplasm of breast: Secondary | ICD-10-CM

## 2024-09-08 ENCOUNTER — Encounter: Payer: Self-pay | Admitting: Gastroenterology

## 2024-09-08 ENCOUNTER — Ambulatory Visit (AMBULATORY_SURGERY_CENTER): Admitting: Gastroenterology

## 2024-09-08 VITALS — BP 136/85 | HR 76 | Temp 97.4°F | Resp 13 | Ht 64.0 in | Wt 150.0 lb

## 2024-09-08 DIAGNOSIS — Z1211 Encounter for screening for malignant neoplasm of colon: Secondary | ICD-10-CM | POA: Diagnosis not present

## 2024-09-08 DIAGNOSIS — Z800 Family history of malignant neoplasm of digestive organs: Secondary | ICD-10-CM

## 2024-09-08 DIAGNOSIS — K635 Polyp of colon: Secondary | ICD-10-CM

## 2024-09-08 DIAGNOSIS — K648 Other hemorrhoids: Secondary | ICD-10-CM

## 2024-09-08 DIAGNOSIS — K5730 Diverticulosis of large intestine without perforation or abscess without bleeding: Secondary | ICD-10-CM | POA: Diagnosis not present

## 2024-09-08 DIAGNOSIS — K6389 Other specified diseases of intestine: Secondary | ICD-10-CM | POA: Diagnosis not present

## 2024-09-08 DIAGNOSIS — D124 Benign neoplasm of descending colon: Secondary | ICD-10-CM

## 2024-09-08 MED ORDER — SODIUM CHLORIDE 0.9 % IV SOLN: 500.0000 mL | INTRAVENOUS | Status: DC

## 2024-09-08 MED ORDER — SODIUM CHLORIDE 0.9 % IV SOLN: 500.0000 mL | Freq: Once | INTRAVENOUS | Status: DC

## 2024-09-08 NOTE — Progress Notes (Signed)
 I have reviewed the patient's medical history in detail and updated the computerized patient record.

## 2024-09-08 NOTE — Progress Notes (Signed)
 [facility] History and Physical   Primary Care Physician:  Alben Therisa MATSU, PA   Reason for Procedure:   Family history of colon cancer  Plan:    colonoscopy     HPI: Stephanie Stephens is a 58 y.o. female  here for colonoscopy screening - father had CRC dx < age 50. Last exam 03/2018 negative.    Patient denies any bowel symptoms at this time. Otherwise feels well without any cardiopulmonary symptoms.   I have discussed risks / benefits of anesthesia and endoscopic procedure with [name] and they wish to proceed with the exams as outlined today.    Past Medical History:  Diagnosis Date   Anemia    Anxiety    Arthritis    Asthma    Blood transfusion without reported diagnosis    Cataract    Colon polyps    Depression    Diabetes mellitus without complication (HCC)    Diverticulosis    Genital herpes    Heart murmur    Hx of adenomatous colonic polyps    Hypertension    Osteoporosis    Pancreatitis    Sarcoidosis    Umbilical hernia     Past Surgical History:  Procedure Laterality Date   CATARACT EXTRACTION, BILATERAL Bilateral 2023   CESAREAN SECTION     x 3   CHOLECYSTECTOMY     lap   COLONOSCOPY  2019   SA-MAC-suprep(good)-tics/hems   HERNIA REPAIR     x 4   PARTIAL HYSTERECTOMY     TONSILLECTOMY      Prior to Admission medications   Medication Sig Start Date End Date Taking? Authorizing Provider  albuterol  (PROVENTIL ) (2.5 MG/3ML) 0.083% nebulizer solution USE 1 VIAL VIA NEBULIZER 4 TIMES DAILY (MANUFACTURER RECOMMENDS  NOT EXCEEDING 4 VIALS/DAY) 02/20/24  Yes Young, Clinton D, MD  albuterol  (VENTOLIN  HFA) 108 (90 Base) MCG/ACT inhaler USE 2 INHALATIONS BY MOUTH EVERY 4 HOURS AS NEEDED 03/27/24  Yes Young, Clinton D, MD  ascorbic acid (VITAMIN C) 1000 MG tablet Take 1,000 mg by mouth daily.   Yes [provider]  budesonide -formoterol  (SYMBICORT ) 160-4.5 MCG/ACT inhaler USE 2 INHALATIONS BY MOUTH FIRST THING IN THE MORNING THEN 2   INHALATIONS BY MOUTH 12 HOURS  LATER 07/02/24  Yes Young, Reggy D, MD  Cholecalciferol (VITAMIN D3) 25 MCG (1000 UT) CAPS Take 1 capsule by mouth daily. 04/26/22  Yes [provider]  Ferrous Sulfate (IRON) 325 (65 Fe) MG TABS Take 1 tablet by mouth 2 (two) times a week. 04/26/22  Yes [provider]  glucose blood (ONETOUCH VERIO) test strip TEST ONCE DAILY FASTING BLOOD GLUCOSE for 100 days   Yes [provider]  glucose blood (ONETOUCH VERIO) test strip USE AS DIRECTED TO TEST ONCE DAILY FASTING BLOOD GLUCOSE 04/26/22  Yes [provider]  Lancets (ONETOUCH DELICA PLUS LANCET30G) MISC USE TO CHECK GLUCOSE ONCE DAILY 06/03/23  Yes [provider]  Magnesium 300 MG CAPS Take 1 capsule by mouth daily.   Yes [provider]  Misc Natural Products (BRAINSTRONG MEMORY SUPPORT) TABS Take 1 tablet by mouth daily.   Yes [provider]  Multiple Vitamin (MULTIVITAMIN WITH MINERALS) TABS Take 1 tablet by mouth daily.   Yes [provider]  [facility] VERIO test strip 1 each by Other route daily.   Yes [provider]  OVER THE COUNTER MEDICATION Take 1 Dose by mouth 2 (two) times daily with a meal. Sour Sop  Yes [provider]  OVER THE COUNTER MEDICATION Take 1 capsule by mouth daily. SEA MOSS   Yes [provider]  amLODipine (NORVASC) 5 MG tablet Take 5 mg by mouth daily. Patient not taking: Reported on 09/08/2024    [provider]  azelastine  (ASTELIN ) 0.1 % nasal spray Place 2 sprays into both nostrils 2 (two) times daily as needed for rhinitis (congestion). Use in each nostril as directed 07/18/22   Marinda Rocky SAILOR, MD  azithromycin  (ZITHROMAX ) 250 MG tablet 2 today then one daily Patient not taking: Reported on 09/08/2024 01/23/24   Neysa Rama D, MD  EPINEPHrine  0.3 mg/0.3 mL IJ SOAJ injection Inject 0.3 mg into the muscle as needed for anaphylaxis. 07/18/22   Marinda Rocky SAILOR, MD  ipratropium  (ATROVENT) 0.03 % nasal spray 2 sprays in each nostril Nasally 3 times a day; Duration: 10 days Patient not taking: Reported on 09/08/2024 02/05/24   [provider]    Current Outpatient Medications  Medication Sig Dispense Refill   albuterol  (PROVENTIL ) (2.5 MG/3ML) 0.083% nebulizer solution USE 1 VIAL VIA NEBULIZER 4 TIMES DAILY (MANUFACTURER RECOMMENDS  NOT EXCEEDING 4 VIALS/DAY) 1200 mL 2   albuterol  (VENTOLIN  HFA) 108 (90 Base) MCG/ACT inhaler USE 2 INHALATIONS BY MOUTH EVERY 4 HOURS AS NEEDED 51 g 2   ascorbic acid (VITAMIN C) 1000 MG tablet Take 1,000 mg by mouth daily.     budesonide -formoterol  (SYMBICORT ) 160-4.5 MCG/ACT inhaler USE 2 INHALATIONS BY MOUTH FIRST THING IN THE MORNING THEN 2  INHALATIONS BY MOUTH 12 HOURS  LATER 30.6 g 8   Cholecalciferol (VITAMIN D3) 25 MCG (1000 UT) CAPS Take 1 capsule by mouth daily.     Ferrous Sulfate (IRON) 325 (65 Fe) MG TABS Take 1 tablet by mouth 2 (two) times a week.     glucose blood (ONETOUCH VERIO) test strip TEST ONCE DAILY FASTING BLOOD GLUCOSE for 100 days     glucose blood (ONETOUCH VERIO) test strip USE AS DIRECTED TO TEST ONCE DAILY FASTING BLOOD GLUCOSE     Lancets (ONETOUCH DELICA PLUS LANCET30G) MISC USE TO CHECK GLUCOSE ONCE DAILY     Magnesium 300 MG CAPS Take 1 capsule by mouth daily.     Misc Natural Products (BRAINSTRONG MEMORY SUPPORT) TABS Take 1 tablet by mouth daily.     Multiple Vitamin (MULTIVITAMIN WITH MINERALS) TABS Take 1 tablet by mouth daily.     ONETOUCH VERIO test strip 1 each by Other route daily.     OVER THE COUNTER MEDICATION Take 1 Dose by mouth 2 (two) times daily with a meal. Sour Sop     OVER THE COUNTER MEDICATION Take 1 capsule by mouth daily. SEA MOSS     amLODipine (NORVASC) 5 MG tablet Take 5 mg by mouth daily. (Patient not taking: Reported on 09/08/2024)     azelastine  (ASTELIN ) 0.1 % nasal spray Place 2 sprays into both nostrils 2 (two) times daily as needed for rhinitis (congestion). Use in  each nostril as directed 30 mL 12   azithromycin  (ZITHROMAX ) 250 MG tablet 2 today then one daily (Patient not taking: Reported on 09/08/2024) 6 tablet 0   EPINEPHrine  0.3 mg/0.3 mL IJ SOAJ injection Inject 0.3 mg into the muscle as needed for anaphylaxis. 2 each 1   ipratropium (ATROVENT) 0.03 % nasal spray 2 sprays in each nostril Nasally 3 times a day; Duration: 10 days (Patient not taking: Reported on 09/08/2024)     No current facility-administered medications for this  visit.    Allergies as of 09/08/2024 - Review Complete 09/08/2024  Allergen Reaction Noted   Bee venom Anaphylaxis 07/11/2018   Ciprofloxacin Swelling 09/02/2023   Latex Rash, Itching, and Nausea And Vomiting 09/02/2023   Mango flavoring agent (non-screening) Anaphylaxis 07/11/2018   Penicillins Rash and Hives 01/08/2023   Nitrofurantoin  Other (See Comments) 09/02/2023    Family History  Problem Relation Age of Onset   Ovarian cancer Mother    Colon polyps Mother    Colon polyps Father    Colon cancer Father    Breast cancer Sister    Stomach cancer Brother 76   Esophageal cancer Neg Hx    Rectal cancer Neg Hx     Social History   Socioeconomic History   Marital status: Divorced    Spouse name: Not on file   Number of children: 3   Years of education: Not on file   Highest education level: Not on file  Occupational History   Occupation: Disability-due to sarcoid   Occupation: studying Insurance claims handler course  Tobacco Use   Smoking status: Former    Current packs/day: 0.25    Types: Cigarettes   Smokeless tobacco: Current    Types: Snuff   Tobacco comments:    Situational smoker   Vaping Use   Vaping status: Never Used  Substance and Sexual Activity   Alcohol use: Not Currently    Alcohol/week: 3.0 standard drinks of alcohol    Types: 3 Standard drinks or equivalent per week    Comment: last drink was 11/19/2021   Drug use: Not Currently    Comment: last used 11/18/2021   Sexual  activity: Yes    Partners: Male    Birth control/protection: Surgical  Other Topics Concern   Not on file  Social History Narrative   Son incarcerated.   Writes with both left and right    Social Drivers of Corporate investment banker Strain: Not on file  Food Insecurity: Not on file  Transportation Needs: Not on file  Physical Activity: Not on file  Stress: Not on file  Social Connections: Not on file  Intimate Partner Violence: Not At Risk (12/06/2021)   Humiliation, Afraid, Rape, and Kick questionnaire    Fear of Current or Ex-Partner: No    Emotionally Abused: No    Physically Abused: No    Sexually Abused: No    Review of Systems: All other review of systems negative except as mentioned in the HPI.  Physical Exam: Vital signs BP 130/74   Pulse 76   Temp (!) 97.4 F (36.3 C) (Temporal)   Ht 5' 4 (1.626 m)   Wt 150 lb (68 kg)   SpO2 100%   BMI 25.75 kg/m   General:   Alert,  Well-developed, pleasant and cooperative in NAD Lungs:  Clear throughout to auscultation.   Heart:  Regular rate and rhythm Abdomen:  Soft, nontender and nondistended.   Neuro/Psych:  Alert and cooperative. Normal mood and affect. A and O x 3  Marcey Naval, MD [facility]astroenterology

## 2024-09-08 NOTE — Op Note (Signed)
 [facility] Patient Name: Stephanie Stephens Procedure Date: 09/08/2024 8:00 AM MRN: [id number] Endoscopist: Elspeth P. Leigh , MD, [id number] Age: 58 Referring MD:  Date of Birth: 12/04/1965 Gender: Female Account #: [id number] Procedure:                Colonoscopy Indications:              Screening in patient at increased risk: Family                            history of 1st-degree relative with colorectal                            cancer (father dx < age 25) Medicines:                Monitored Anesthesia Care Procedure:                Pre-Anesthesia Assessment:                           - Prior to the procedure, a History and Physical                            was performed, and patient medications and                            allergies were reviewed. The patient's tolerance of                            previous anesthesia was also reviewed. The risks                            and benefits of the procedure and the sedation                            options and risks were discussed with the patient.                            All questions were answered, and informed consent                            was obtained. Prior Anticoagulants: The patient has                            taken no anticoagulant or antiplatelet agents. ASA                            Grade Assessment: II - A patient with mild systemic                            disease. After reviewing the risks and benefits,                            the patient was deemed in satisfactory condition to  undergo the procedure.                           After obtaining informed consent, the colonoscope                            was passed under direct vision. Throughout the                            procedure, the patient's blood pressure, pulse, and                            oxygen saturations were monitored continuously. The                            Olympus Scope SN: [id number]  was introduced through                            the anus and advanced to the the cecum, identified                            by appendiceal orifice and ileocecal valve. The                            colonoscopy was performed without difficulty. The                            patient tolerated the procedure well. The quality                            of the bowel preparation was adequate. The                            ileocecal valve, appendiceal orifice, and rectum                            were photographed. Scope In: 8:06:13 AM Scope Out: 8:22:33 AM Scope Withdrawal Time: 0 hours 13 minutes 23 seconds  Total Procedure Duration: 0 hours 16 minutes 20 seconds  Findings:                 The perianal and digital rectal examinations were                            normal.                           Many diverticula were found in the transverse colon                            and left colon.                           A 3 to 4 mm polyp was found in the descending  colon. The polyp was sessile. The polyp was removed                            with a cold snare. Resection and retrieval were                            complete.                           Internal hemorrhoids were found during retroflexion.                           The exam was otherwise without abnormality. Complications:            No immediate complications. Estimated blood loss:                            Minimal. Estimated Blood Loss:     Estimated blood loss was minimal. Impression:               - Diverticulosis in the transverse colon and in the                            left colon.                           - One 3 to 4 mm polyp in the descending colon,                            removed with a cold snare. Resected and retrieved.                           - Internal hemorrhoids.                           - The examination was otherwise normal. Recommendation:           - Patient has a  contact number available for                            emergencies. The signs and symptoms of potential                            delayed complications were discussed with the                            patient. Return to normal activities tomorrow.                            Written discharge instructions were provided to the                            patient.                           - Resume previous diet.                           -  Continue present medications.                           - Await pathology results. Anticipate repeat exam                            in 5 years given strong family history of colon                            cancer Stephanie Avis P. Kadie Balestrieri, MD 09/08/2024 8:34:45 AM This report has been signed electronically.

## 2024-09-08 NOTE — Patient Instructions (Addendum)
Resume previous diet. Continue present medications. Await pathology results. Repeat colonoscopy in 5 years for surveillance.  YOU HAD AN ENDOSCOPIC PROCEDURE TODAY AT THE [facility]:   Refer to the procedure report that was given to you for any specific questions about what was found during the examination.  If the procedure report does not answer your questions, please call your gastroenterologist to clarify.  If you requested that your care partner not be given the details of your procedure findings, then the procedure report has been included in a sealed envelope for you to review at your convenience later.  YOU SHOULD EXPECT: Some feelings of bloating in the abdomen. Passage of more gas than usual.  Walking can help get rid of the air that was put into your GI tract during the procedure and reduce the bloating. If you had a lower endoscopy (such as a colonoscopy or flexible sigmoidoscopy) you may notice spotting of blood in your stool or on the toilet paper. If you underwent a bowel prep for your procedure, you may not have a normal bowel movement for a few days.  Please Note:  You might notice some irritation and congestion in your nose or some drainage.  This is from the oxygen used during your procedure.  There is no need for concern and it should clear up in a day or so.  SYMPTOMS TO REPORT IMMEDIATELY:  Following lower endoscopy (colonoscopy or flexible sigmoidoscopy):  Excessive amounts of blood in the stool  Significant tenderness or worsening of abdominal pains  Swelling of the abdomen that is new, acute  Fever of 100F or higher   For urgent or emergent issues, a gastroenterologist can be reached at any hour by calling [phone]. Do not use MyChart messaging for urgent concerns.    DIET:  We do recommend a small meal at first, but then you may proceed to your regular diet.  Drink plenty of fluids but you should avoid alcoholic beverages for 24  hours.  ACTIVITY:  You should plan to take it easy for the rest of today and you should NOT DRIVE or use heavy machinery until tomorrow (because of the sedation medicines used during the test).    FOLLOW UP: Our staff will call the number listed on your records the next business day following your procedure.  We will call around 7:15- 8:00 am to check on you and address any questions or concerns that you may have regarding the information given to you following your procedure. If we do not reach you, we will leave a message.     If any biopsies were taken you will be contacted by phone or by letter within the next 1-3 weeks.  Please call us at [phone] if you have not heard about the biopsies in 3 weeks.    SIGNATURES/CONFIDENTIALITY: You and/or your care partner have signed paperwork which will be entered into your electronic medical record.  These signatures attest to the fact that that the information above on your After Visit Summary has been reviewed and is understood.  Full responsibility of the confidentiality of this discharge information lies with you and/or your care-partner.

## 2024-09-08 NOTE — Progress Notes (Signed)
 To pacu, VSS. Report to [email or web address]

## 2024-09-09 ENCOUNTER — Telehealth: Payer: Self-pay | Admitting: Lactation Services

## 2024-09-09 NOTE — Telephone Encounter (Signed)
  Follow up Call-     09/08/2024    7:25 AM  Call back number  Post procedure Call Back phone  # [phone]  Permission to leave phone message Yes     Patient questions:  Do you have a fever, pain , or abdominal swelling? No. Pain Score  0 *  Have you tolerated food without any problems? Yes.    Have you been able to return to your normal activities? Yes.    Do you have any questions about your discharge instructions: Diet   No. Medications  No. Follow up visit  No.  Do you have questions or concerns about your Care? No.  Actions: * If pain score is 4 or above: No action needed, pain <4.

## 2024-09-11 LAB — SURGICAL PATHOLOGY

## 2024-09-14 ENCOUNTER — Ambulatory Visit: Payer: Self-pay | Admitting: Gastroenterology

## 2024-09-14 ENCOUNTER — Ambulatory Visit: Payer: Self-pay | Admitting: Internal Medicine

## 2024-09-14 ENCOUNTER — Telehealth: Payer: Self-pay | Admitting: *Deleted

## 2024-09-14 DIAGNOSIS — D869 Sarcoidosis, unspecified: Secondary | ICD-10-CM

## 2024-09-14 NOTE — Telephone Encounter (Signed)
 FYI Only or Action Required?: FYI only for provider.  Patient was last seen in primary care on Sees pulmonary.  Called Nurse Triage reporting Breathing Problem.  Symptoms began several days ago.  Interventions attempted: Prescription medications: inhalers.  Symptoms are: unchanged.  Triage Disposition: See PCP When Office is Open (Within 3 Days)  Patient/caregiver understands and will follow disposition?:   Reason for Disposition  [1] MODERATE longstanding difficulty breathing (e.g., speaks in phrases, SOB even at rest, pulse 100-120) AND [2] SAME as normal  Answer Assessment - Initial Assessment Questions Recently seen for upper back pain. States pain resolved and she has begun to experience SOB that reminds her of when her sarcoidosis started.   EMS came to pts house 09/12/24 for c/o pain and SOB. States EMS ran tests and advised her that her vitals signs were good. Pt refused transport to ED and went to UC instead. At [facility], documentation reports her SPO2 was 99%. Dx with acute L side thoracic back pain and Essential HTN.   States SOB is worse with exertion or speaking at length. Able to speak clearly in full sentences while on the phone with the RN. Pt unable to locate her SPO2 monitor to check current level. Utilized albuterol  and symbicort  while on the phone and reported improvement in symptoms. Scheduled OV. Pt to call back or seek medical attention with any worsening of symptoms.  Note: Pt reports needs order for new nebulizer tubing.   1. RESPIRATORY STATUS: Describe your breathing? (e.g., wheezing, shortness of breath, unable to speak, severe coughing)      SOB with exertion  2. ONSET: When did this breathing problem begin?      09/12/24  3. PATTERN Does the difficult breathing come and go, or has it been constant since it started?      With exertion  4. SEVERITY: How bad is your breathing? (e.g., mild, moderate, severe)      Moderate  5. RECURRENT  SYMPTOM:       Yes, hx of sarcoidosis  6. LUNG HISTORY: Do you have any history of lung disease?  (e.g., pulmonary embolus, asthma, emphysema)     Sarcoidosis  7. CAUSE: What do you think is causing the breathing problem?      Unknown, believes d/t sarcoidosis  8. OTHER SYMPTOMS: Do you have any other symptoms? (e.g., chest pain, cough, dizziness, fever, runny nose)     Back pain  9. O2 SATURATION MONITOR:  Do you use an oxygen saturation monitor (pulse oximeter) at home? If Yes, ask: What is your reading (oxygen level) today? What is your usual oxygen saturation reading? (e.g., 95%)       Unavailable  Protocols used: Breathing Difficulty-A-AH

## 2024-09-14 NOTE — Telephone Encounter (Signed)
 I have addended my September office note as requested. Please send scrip for requested mouth pieces, hose, filters, medication cups, and for DuoNeb 360 ml, 1 neb every 6 hours as needed, refills x 1 year. Thanks

## 2024-09-14 NOTE — Telephone Encounter (Signed)
 PAS attempted to transfer call pt no longer on line.   Copied from CRM [id number]. Topic: Clinical - Red Word Triage >> Sep 14, 2024 12:00 PM Daniela A wrote: [facility] that prompted transfer to Nurse Triage: patient experiencing SOB

## 2024-09-14 NOTE — Telephone Encounter (Signed)
 Nurse called pt back: spoke with patient & pt stated on other line at present time, pt asked could someone give her a call back before the end of the day: nurse informed pt, yes, someone would attempt to reach out later.

## 2024-09-14 NOTE — Telephone Encounter (Signed)
 Copied from CRM #[id number]. Topic: Clinical - Order For Equipment >> Sep 14, 2024 11:58 AM Ismael A wrote: Reason for CRM: patient called in requesting tubing and mouth piece for nebulizer  I called and spoke with patient, she states she has not received any new supplies for her nebulizer machine since she received it back in 2021.  She needs mouth pieces and tubing.  Before I could finish the conversation, she received a call from her son who is in prison.  She asked that I call her back.  I called [facility] to find out about her getting supplies (tubing, mouthpieces and replacement medicine cups for nebulizer). She received the nebulizer in 2021 and states she has not received any new supplies since that time.   I spoke with Jill at the local branch, she states for her to get new supplies, she needs an order for what supplies she needs, why she needs them (what condition) and OV notes stating she needs them.  She can either have them shipped to her or come by the store to get them.  Dr. Neysa, Patient is requesting supplies for her nebulizer she has had since 2021 when you ordered it for her.  In order for her to get new supplies, we have to send a new order with what specific supplies she needs, the last OV note must mention the need for the nebulizer and the diagnosis (why she needs the nebulizer).  Please advise if ok to send new order for nebulizer supplies (tubing, mouth pieces, cup to hold medication)?  Thank you.

## 2024-09-15 MED ORDER — IPRATROPIUM-ALBUTEROL 0.5-2.5 (3) MG/3ML IN SOLN: 3.0000 mL | Freq: Four times a day (QID) | RESPIRATORY_TRACT | 12 refills | Status: AC | PRN

## 2024-09-15 NOTE — Telephone Encounter (Signed)
 Called and spoke with the pt and let her know the order has been placed for her nebulizer supplies.  Rx for Duoneb has been sent to pt preferred pharmacy as well.  Nothing further needed.

## 2024-09-16 ENCOUNTER — Encounter: Payer: Self-pay | Admitting: *Deleted

## 2024-09-16 ENCOUNTER — Ambulatory Visit (INDEPENDENT_AMBULATORY_CARE_PROVIDER_SITE_OTHER): Admitting: Internal Medicine

## 2024-09-16 ENCOUNTER — Encounter: Payer: Self-pay | Admitting: Internal Medicine

## 2024-09-16 VITALS — BP 143/82 | HR 74 | Temp 98.1°F | Ht 64.0 in | Wt 153.6 lb

## 2024-09-16 DIAGNOSIS — D869 Sarcoidosis, unspecified: Secondary | ICD-10-CM

## 2024-09-16 DIAGNOSIS — D860 Sarcoidosis of lung: Secondary | ICD-10-CM

## 2024-09-16 DIAGNOSIS — F17211 Nicotine dependence, cigarettes, in remission: Secondary | ICD-10-CM

## 2024-09-16 DIAGNOSIS — Z87891 Personal history of nicotine dependence: Secondary | ICD-10-CM | POA: Insufficient documentation

## 2024-09-16 DIAGNOSIS — J4489 Other specified chronic obstructive pulmonary disease: Secondary | ICD-10-CM | POA: Diagnosis not present

## 2024-09-16 MED ORDER — PREDNISONE 10 MG PO TABS: ORAL_TABLET | ORAL | 5 refills | Status: AC

## 2024-09-16 NOTE — Patient Instructions (Addendum)
 Plan A = Automatic = Always=    sSymbicort 160 Take 2 puffs first thing in am and then another 2 puffs about 12 hours later.    Work on inhaler technique:  relax and gently blow all the way out then take a nice smooth full deep breath back in, triggering the inhaler at same time you start breathing in.  Hold breath in for at least  5 seconds if you can. Blow out Symbicort   thru nose. Rinse and gargle with water when done.  If mouth or throat bother you at all,  try brushing teeth/gums/tongue with arm and hammer toothpaste/ make a slurry and gargle and spit out.     Plan B = Backup (to supplement plan A, not to replace it) Use your albuterol  inhaler as a rescue medication to be used if you can't catch your breath by resting or slowing your pace  or doing a relaxed purse lip breathing pattern.  - The less you use it, the better it will work when you need it. - Ok to use the inhaler up to 2 puffs  every 4 hours if you must but call for appointment if use goes up over your usual need - Don't leave home without it !!  (think of it like the spare tire or starter fluid for your car)    Plan C = Crisis (instead of Plan B but only if Plan B stops working) - only use your albuterol  nebulizer if you first try Plan B and it fails to help > ok to use the nebulizer up to every 4 hours but if start needing it regularly call for immediate appointment   Plan D = Deltasone   if  ABC not working  Prednisone  10 mg take  4 each am x 2 days,   2 each am x 2 days,  1 each am x 2 days and stop   My office will be contacting you by phone for referral to lung cancer screening   [phone]) - if you don't hear back from my office within one week,  please call us  back or notify us  thru MyChart and we'll address it right away.    Follow up in this clinic is as needed

## 2024-09-16 NOTE — Assessment & Plan Note (Addendum)
 Quit smoking 05/2024 - 09/16/2024  After extensive coaching inhaler device,  effectiveness =    60% (late insp when done s spacer)   ? Relation to sarcoid airway involvement since flared p cigs stopped > rec ABCD action plan and f/u per Dr Neysa or whoever replaces him p Nov 19 2024 on his retirement/ discussed

## 2024-09-16 NOTE — Assessment & Plan Note (Addendum)
 Quit smoking 05/2024   Low-dose CT lung cancer screening is recommended for patients who are 79-57 years of age with a 20+ pack-year history of smoking and who are currently smoking or quit <=15 years ago. No coughing up blood  No unintentional weight loss of > 15 pounds in the last 6 months - pt is eligible for scanning yearly until 2040 > referred today

## 2024-09-16 NOTE — Progress Notes (Signed)
 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  She sometimes gets winded walking just room to room.   Dyspnea:  room to room for several months  Cough: none  Sleep: bothered by nasal congestion lots of am  throat congestion  SABA use: has not increased since  02 ldz:wnwz LDSCT:referred 09/16/2024   No obvious day to day or daytime pattern/variability or assoc excess/ purulent sputum or mucus plugs or hemoptysis or cp or chest tightness, subjective wheeze or overt sinus or hb symptoms.    Also denies any obvious fluctuation of symptoms with weather or environmental changes or other aggravating or alleviating factors except as outlined above   No unusual exposure hx or h/o childhood pna/ asthma or knowledge of premature birth.  Current Allergies, Complete Past Medical History, Past Surgical History, Family History, and Social History were reviewed in [employer] record.  ROS  The following are not active complaints unless bolded Hoarseness, sore throat, dysphagia, dental problems, itching, sneezing,  nasal congestion or discharge of excess mucus or purulent secretions, ear ache,   fever, chills, sweats, unintended wt loss or wt gain, classically pleuritic or exertional cp,  orthopnea pnd or arm/hand swelling  or leg swelling, presyncope, palpitations, abdominal pain, anorexia, nausea, vomiting, diarrhea  or change in bowel habits or change in bladder habits, change in stools or change in urine, dysuria, hematuria,  rash, arthralgias,  visual complaints, headache, numbness, weakness or ataxia or problems with walking or coordination,  change in mood or  memory. 3 deaths in fm this year            Outpatient Medications Prior to Visit  Medication Sig Dispense Refill   albuterol  (PROVENTIL ) (2.5 MG/3ML) 0.083% nebulizer solution USE 1 VIAL VIA NEBULIZER 4 TIMES DAILY (MANUFACTURER RECOMMENDS  NOT EXCEEDING 4 VIALS/DAY) 1200 mL 2   albuterol  (VENTOLIN  HFA) 108 (90 Base) MCG/ACT inhaler USE 2 INHALATIONS BY MOUTH EVERY 4 HOURS AS NEEDED 51 g 2   amLODipine (NORVASC) 5 MG tablet Take 5 mg by mouth daily.     ascorbic acid (VITAMIN C) 1000 MG tablet Take 1,000 mg by mouth daily.     budesonide -formoterol  (SYMBICORT ) 160-4.5 MCG/ACT inhaler USE 2 INHALATIONS BY MOUTH FIRST THING IN THE MORNING THEN 2  INHALATIONS BY MOUTH 12 HOURS  LATER 30.6 g 8   Cholecalciferol (VITAMIN D3) 25 MCG (1000 UT) CAPS Take 1 capsule by mouth daily.     EPINEPHrine  0.3 mg/0.3 mL IJ SOAJ injection Inject 0.3 mg into the muscle as needed for anaphylaxis. 2 each 1   Ferrous Sulfate (IRON) 325 (65 Fe) MG TABS Take 1 tablet by mouth 2 (two) times a week.     glucose blood (ONETOUCH VERIO) test strip TEST ONCE DAILY FASTING BLOOD GLUCOSE for 100 days     glucose blood (ONETOUCH VERIO) test strip USE AS DIRECTED TO TEST ONCE DAILY FASTING BLOOD GLUCOSE  ipratropium-albuterol  (DUONEB) 0.5-2.5 (3) MG/3ML SOLN Take 3 mLs by nebulization every 6 (six) hours as needed. 360 mL 12   Lancets (ONETOUCH DELICA PLUS LANCET30G) MISC USE TO CHECK GLUCOSE ONCE DAILY     Magnesium 300 MG CAPS Take 1 capsule by mouth daily.     Misc Natural Products (BRAINSTRONG MEMORY SUPPORT) TABS Take 1 tablet by mouth daily.     Multiple Vitamin (MULTIVITAMIN WITH MINERALS) TABS Take 1 tablet by mouth daily.     ONETOUCH VERIO test strip 1 each by Other route daily.     OVER THE COUNTER MEDICATION Take 1 Dose by mouth 2 (two) times daily with a meal. Sour Sop     OVER THE COUNTER  MEDICATION Take 1 capsule by mouth daily. SEA MOSS     azelastine  (ASTELIN ) 0.1 % nasal spray Place 2 sprays into both nostrils 2 (two) times daily as needed for rhinitis (congestion). Use in each nostril as directed 30 mL 12   azithromycin  (ZITHROMAX ) 250 MG tablet 2 today then one daily (Patient not taking: Reported on 09/08/2024) 6 tablet 0   ipratropium (ATROVENT) 0.03 % nasal spray 2 sprays in each nostril Nasally 3 times a day; Duration: 10 days     No facility-administered medications prior to visit.    Past Medical History:  Diagnosis Date   Anemia    Anxiety    Arthritis    Asthma    Blood transfusion without reported diagnosis    Cataract    Colon polyps    Depression    Diabetes mellitus without complication (HCC)    Diverticulosis    Genital herpes    Heart murmur    Hx of adenomatous colonic polyps    Hypertension    Osteoporosis    Pancreatitis    Sarcoidosis    Umbilical hernia       Objective:     BP (!) 143/82   Pulse 74   Temp 98.1 F (36.7 C) (Oral)   Ht 5' 4 (1.626 m) Comment: on RA  Wt 153 lb 9.6 oz (69.7 kg)   SpO2 94% Comment: on RA  BMI 26.37 kg/m   SpO2: 94 % (on RA)  animated amb bf nad      HEENT : Oropharynx  clear      Nasal turbinates non-specific edema    NECK :  without  apparent JVD/ palpable Nodes/TM    LUNGS: no acc muscle use,  Nl contour chest which is clear to A and P bilaterally without cough on insp or exp maneuvers   CV:  RRR  no s3 or murmur or increase in P2, and no edema   ABD:  soft and nontender   MS:  Gait nl   ext warm without deformities Or obvious joint restrictions  calf tenderness, cyanosis or clubbing    SKIN: warm and dry without lesions    NEURO:  alert, approp, nl sensorium with  no motor or cerebellar deficits apparent.     I personally reviewed images and agree with radiology impression as follows:  CXR:   pa and lateral 08/13/24 Mild bilateral suprahilar fibrosis, similar to that seen on  prior chest radiograph.     Assessment   Assessment & Plan Former cigarette smoker Quit smoking 05/2024   Low-dose CT lung cancer screening is recommended for patients who are 57-57 years of age with a 20+ pack-year history of smoking and who are currently smoking or quit <=15 years ago. No  coughing up blood  No unintentional weight loss of > 15 pounds in the last 6 months - pt is eligible for scanning yearly until 2040 > referred today     Asthmatic bronchitis , chronic (HCC) Quit smoking 05/2024 - 09/16/2024  After extensive coaching inhaler device,  effectiveness =    60% (late insp when done s spacer)   ? Relation to sarcoid airway involvement since flared p cigs stopped > rec ABCD action plan and f/u per Dr Stephanie Stephens or whoever replaces him p Nov 19 2024 on his retirement/ discussed    PULMONARY SARCOIDOSIS ILD dates back to 2011 CT abd - 09/16/2024   Walked on RA  x  3  lap(s) =  approx 750  ft  @ mod   pace, stopped due to end of study  with lowest 02 sats 99% and min sob    >>> no evidence of significant ILD from sarcoid    Each maintenance medication was reviewed in detail including emphasizing most importantly the difference between maintenance and prns and under what circumstances the prns are to be triggered using an action plan format where appropriate.  Total time for H and P, chart review, counseling, reviewing hfa/ neb  device(s) , directly observing portions of ambulatory 02 saturation study/ and generating customized AVS unique to this office visit / same day charting = 42 min acute ov with pt new to me          AVS  Patient Instructions  Plan A = Automatic = Always=    sSymbicort 160 Take 2 puffs first thing in am and then another 2 puffs about 12 hours later.    Work on inhaler technique:  relax and gently blow all the way out then take a nice smooth full deep breath back in, triggering the inhaler at same time you start breathing in.  Hold breath in for at  least  5 seconds if you can. Blow out Symbicort   thru nose. Rinse and gargle with water when done.  If mouth or throat bother you at all,  try brushing teeth/gums/tongue with arm and hammer toothpaste/ make a slurry and gargle and spit out.     Plan B = Backup (to supplement plan A, not to replace it) Use your albuterol  inhaler as a rescue medication to be used if you can't catch your breath by resting or slowing your pace  or doing a relaxed purse lip breathing pattern.  - The less you use it, the better it will work when you need it. - Ok to use the inhaler up to 2 puffs  every 4 hours if you must but call for appointment if use goes up over your usual need - Don't leave home without it !!  (think of it like the spare tire or starter fluid for your car)    Plan C = Crisis (instead of Plan B but only if Plan B stops working) - only use your albuterol  nebulizer if you first try Plan B and it fails to help > ok to use the nebulizer up to every 4 hours but if start needing it regularly call for immediate appointment   Plan D = Deltasone   if  ABC not working  Prednisone  10 mg take  4 each am x 2 days,   2 each am x 2 days,  1 each am x 2 days and stop   My office will be contacting you by phone for referral to lung cancer  screening   [phone]) - if you don't hear back from my office within one week,  please call us  back or notify us  thru MyChart and we'll address it right away.    Follow up in this clinic is as needed         Ozell America, MD 09/16/2024

## 2024-09-16 NOTE — Assessment & Plan Note (Addendum)
 ILD dates back to 2011 CT abd - 09/16/2024   Walked on RA  x  3  lap(s) =  approx 750  ft  @ mod   pace, stopped due to end of study  with lowest 02 sats 99% and min sob    >>> no evidence of significant ILD from sarcoid    Each maintenance medication was reviewed in detail including emphasizing most importantly the difference between maintenance and prns and under what circumstances the prns are to be triggered using an action plan format where appropriate.  Total time for H and P, chart review, counseling, reviewing hfa/ neb  device(s) , directly observing portions of ambulatory 02 saturation study/ and generating customized AVS unique to this office visit / same day charting = 42 min acute ov with pt new to me

## 2024-09-17 ENCOUNTER — Ambulatory Visit: Admitting: Internal Medicine

## 2024-09-23 ENCOUNTER — Telehealth: Payer: Self-pay | Admitting: Acute Care

## 2024-09-23 DIAGNOSIS — Z87891 Personal history of nicotine dependence: Secondary | ICD-10-CM

## 2024-09-23 DIAGNOSIS — Z122 Encounter for screening for malignant neoplasm of respiratory organs: Secondary | ICD-10-CM

## 2024-09-23 NOTE — Telephone Encounter (Signed)
 Lung Cancer Screening Narrative/Criteria Questionnaire (Cigarette Smokers Only- No Cigars/Pipes/vapes)   Stephanie Stephens   SDMV:09/30/24 @0945a [place]                                           June 17, 1966               LDCT: 10/02/24 at 0930a/[facility] [address]    58 y.o.   Phone: [phone]  Lung Screening Narrative (confirm age 69-77 yrs Medicare / 50-80 yrs Private pay insurance)   Insurance information:UHC / Medicaid   Referring Provider:Wert   This screening involves an initial phone call with a team member from our program. It is called a shared decision making visit. The initial meeting is required by insurance and Medicare to make sure you understand the program. This appointment takes about 15-20 minutes to complete. The CT scan will completed at a separate date/time. This scan takes about 5-10 minutes to complete and you may eat and drink before and after the scan.  Criteria questions for Lung Cancer Screening:   Are you a current or former smoker? Former Age began smoking: 31y   If you are a former smoker, what year did you quit smoking? Quit Aug 2025   To calculate your smoking history, I need an accurate estimate of how many packs of cigarettes you smoked per day and for how many years. (Not just the number of PPD you are now smoking)   Years smoking 27 x Packs per day 1.5 = Pack years 41   (at least 20 pack yrs)   (Make sure they understand that we need to know how much they have smoked in the past, not just the number of PPD they are smoking now)  Do you have a personal history of cancer?  No    Do you have a family history of cancer? Yes  (cancer type and and relative) father/colon and has multiple siblings with cancer, one brother w/prostate and she states she is a private person and does not wish to discuss other siblings hx  Are you coughing up blood?  No  Have you had unexplained weight loss of 15 lbs or more in the last 6 months? No  It looks like you meet all  criteria.     Additional information: N/A

## 2024-09-24 ENCOUNTER — Telehealth: Payer: Self-pay

## 2024-09-24 NOTE — Telephone Encounter (Signed)
 ONO results placed in Dr. Sheryll mailbox

## 2024-09-24 NOTE — Telephone Encounter (Signed)
 Copied from CRM #[id number]. Topic: Clinical - Lab/Test Results >> Sep 24, 2024  8:55 AM Benton KIDD wrote: Reason for CRM: patient is calling about results from the pulse oximetry . [Please reach out to patient concerning her results [phone]  [facility], please advise as I do not see results in chart

## 2024-09-24 NOTE — Telephone Encounter (Signed)
 Mechele asked Avelina for an update on ONO results and to get them over to our office

## 2024-09-24 NOTE — Telephone Encounter (Signed)
 Routing to Amy as she is Dr Sheryll nurse.

## 2024-09-24 NOTE — Telephone Encounter (Signed)
 Patient is calling cause she doesn't need the nebulizer . Patient says she needs the tubing to the machine and the mouth piece. Patient says she dont need the solution .

## 2024-09-24 NOTE — Telephone Encounter (Signed)
 Neb supplies ordered 09/16/24- received by [facility]. I called and spoke with the pt and advised her that this was done. She will call them for update. Nothing further needed.

## 2024-09-30 ENCOUNTER — Encounter: Payer: Self-pay | Admitting: Adult Health

## 2024-09-30 ENCOUNTER — Ambulatory Visit: Admitting: Adult Health

## 2024-09-30 DIAGNOSIS — Z87891 Personal history of nicotine dependence: Secondary | ICD-10-CM | POA: Diagnosis not present

## 2024-09-30 NOTE — Telephone Encounter (Signed)
 Results faxed and given to Dr. Neysa.  Dr. Neysa is reviewing.

## 2024-09-30 NOTE — Patient Instructions (Signed)

## 2024-09-30 NOTE — Progress Notes (Signed)
  Virtual Visit via Telephone Note  I connected with Stephanie Stephens , 09/30/24 9:50 AM by a telemedicine application and verified that I am speaking with the correct person using two identifiers.  Location: Patient: home Provider: home   I discussed the limitations of evaluation and management by telemedicine and the availability of in person appointments. The patient expressed understanding and agreed to proceed.   Shared Decision Making Visit Lung Cancer Screening Program [phone])   Eligibility: 58 y.o. Pack Years Smoking History Calculation = 41 pack years  (# packs/per year x # years smoked) Recent History of coughing up blood  no Unexplained weight loss? no ( >Than 15 pounds within the last 6 months ) Prior History Lung / other cancer no (Diagnosis within the last 5 years already requiring surveillance chest CT Scans). Smoking Status Former Smoker Former Smokers: Years since quit: < 1 year  Quit Date: 06/2024  Visit Components: Discussion included one or more decision making aids. YES Discussion included risk/benefits of screening. YES Discussion included potential follow up diagnostic testing for abnormal scans. YES Discussion included meaning and risk of over diagnosis. YES Discussion included meaning and risk of False Positives. YES Discussion included meaning of total radiation exposure. YES  Counseling Included: Importance of adherence to annual lung cancer LDCT screening. YES Impact of comorbidities on ability to participate in the program. YES Ability and willingness to under diagnostic treatment. YES  Smoking Cessation Counseling: Former Smokers:  Discussed the importance of maintaining cigarette abstinence. yes Diagnosis Code: Personal History of Nicotine  Dependence. S12.108 Information about tobacco cessation classes and interventions provided to patient. Yes Patient provided with ticket for LDCT Scan. yes Written Order for Lung Cancer Screening with  LDCT placed in Epic. Yes (CT Chest Lung Cancer Screening Low Dose W/O CM) [id number]   Z12.2-Screening of respiratory organs Z87.891-Personal history of nicotine  dependence   Stephanie Stephens 09/30/24

## 2024-10-01 ENCOUNTER — Telehealth: Payer: Self-pay

## 2024-10-01 NOTE — Telephone Encounter (Signed)
 Copied from CRM #[id number]. Topic: Clinical - Lab/Test Results >> Sep 24, 2024  8:55 AM Benton KIDD wrote: Reason for CRM: patient is calling about results from the pulse oximetry . [Please reach out to patient concerning her results [phone] >> Oct 01, 2024 11:06 AM Rozanna MATSU wrote: Pt calling back about her results, advised her Dr. Neysa is reviewing results.     Tried to reach out to patient in regards to her message , I am not seeing an order for anything that she asking for results I see LCS, and a CT order placed but that well beyond the first call from 10/6

## 2024-10-02 ENCOUNTER — Ambulatory Visit
Admission: RE | Admit: 2024-10-02 | Discharge: 2024-10-02 | Disposition: A | Discharge status: Home / Self Care | Source: Ambulatory Visit | Attending: Acute Care | Admitting: Acute Care

## 2024-10-02 ENCOUNTER — Ambulatory Visit: Payer: Self-pay | Admitting: Internal Medicine

## 2024-10-02 DIAGNOSIS — Z87891 Personal history of nicotine dependence: Secondary | ICD-10-CM

## 2024-10-02 DIAGNOSIS — Z122 Encounter for screening for malignant neoplasm of respiratory organs: Secondary | ICD-10-CM

## 2024-10-02 NOTE — Progress Notes (Signed)
Pt is aware of results.  Nothing further needed. 

## 2024-10-06 NOTE — Telephone Encounter (Signed)
 Neysa Rama D, MD to [facility]    10/02/24  9:22 AM Result Note Overnight oximetry test showed oxygen level stayed in the normal range all night- good report. No oxygen needed. Overnight Pulse Oximetry Study  Patient is aware of results.

## 2024-10-08 ENCOUNTER — Telehealth: Payer: Self-pay | Admitting: *Deleted

## 2024-10-08 NOTE — Telephone Encounter (Signed)
 Made pt aware results not ready.  Copied from CRM [id number]. Topic: Clinical - Lab/Test Results >> Oct 07, 2024  1:16 PM Stephanie Stephens wrote: Reason for CRM: Pt is calling for her ct chest lung cancer screening low dose wo contrast results. It shows it hasn't been released to the pt yet, and the exam was completed on 10/02/2024.  Please call the pt back as soon as possible at phone number [phone] ok to leave a vm. Pt stated she's okay with the results being relayed via vm if need be.

## 2024-10-12 ENCOUNTER — Telehealth: Payer: Self-pay | Admitting: Acute Care

## 2024-10-12 ENCOUNTER — Encounter: Payer: Self-pay | Admitting: Acute Care

## 2024-10-12 ENCOUNTER — Other Ambulatory Visit: Payer: Self-pay

## 2024-10-12 DIAGNOSIS — Z122 Encounter for screening for malignant neoplasm of respiratory organs: Secondary | ICD-10-CM

## 2024-10-12 DIAGNOSIS — Z87891 Personal history of nicotine dependence: Secondary | ICD-10-CM

## 2024-10-12 NOTE — Telephone Encounter (Signed)
 Called and left VM for pt. Left detailed message regarding her results. Patient has not seen PCP for a while and does not have a cardiologist.

## 2024-10-12 NOTE — Telephone Encounter (Signed)
 Spoke with Lauraine Lites NP regarding results. Patient has a trace pericardial effusion. Patient does not have a cardiologist nor has she seen her PCP recently. Lauraine Lites NP recommends she be referred to cardiology for eval.   When pt calls back can discuss referral with her.

## 2024-10-12 NOTE — Telephone Encounter (Signed)
 Pt. LDCT read as a LR 1, negative exam. 12 month follow up LDCT due 09/2025.Please let PCP know plan for follow up.  There was an incidental finding of a small pericardial effusion . See if the patient would like a referral to cardiology. Thanks so much

## 2024-11-04 ENCOUNTER — Other Ambulatory Visit: Payer: Self-pay | Admitting: Internal Medicine

## 2024-11-06 ENCOUNTER — Encounter: Payer: Self-pay | Admitting: Internal Medicine

## 2024-11-10 ENCOUNTER — Other Ambulatory Visit

## 2024-11-26 NOTE — Progress Notes (Unsigned)
" °  Cardiology Office Note   Date:  11/26/2024  ID:  Stephanie Stephens, DOB 1966/06/16, MRN [id number] PCP: Alben Therisa MATSU, PA  [facility] Providers Cardiologist:  None { Click to update primary MD,subspecialty MD or APP then REFRESH:1}    History of Present Illness Stephanie Stephens is a 59 y.o. female with a past medical history of tobacco use. Presents today for evaluation of palpitations and chest pain   Patient was seen by PCP on 11/09/24. Reported having chest pain and palpitations. Pain was atypical and consistent with costochondritis. Started on tramadol . EKG reportedly normal. She was referred to cardiology.   ROS: ***  Studies Reviewed      *** Risk Assessment/Calculations {Does this patient have ATRIAL FIBRILLATION?:[id number]} No BP recorded.  {Refresh Note OR Click here to enter BP  :1}***       Physical Exam VS:  There were no vitals taken for this visit.       Wt Readings from Last 3 Encounters:  09/16/24 153 lb 9.6 oz (69.7 kg)  09/08/24 150 lb (68 kg)  08/13/24 151 lb (68.5 kg)    GEN: Well nourished, well developed in no acute distress NECK: No JVD; No carotid bruits CARDIAC: ***RRR, no murmurs, rubs, gallops RESPIRATORY:  Clear to auscultation without rales, wheezing or rhonchi  ABDOMEN: Soft, non-tender, non-distended EXTREMITIES:  No edema; No deformity   ASSESSMENT AND PLAN ***    {Are you ordering a CV Procedure (e.g. stress test, cath, DCCV, TEE, etc)?   Press F2        :[id number]}  Dispo: ***  Signed, Rollo FABIENE Louder, PA-C   "

## 2024-11-27 ENCOUNTER — Ambulatory Visit: Admitting: Cardiology

## 2024-11-30 ENCOUNTER — Other Ambulatory Visit (HOSPITAL_BASED_OUTPATIENT_CLINIC_OR_DEPARTMENT_OTHER)

## 2024-12-08 ENCOUNTER — Ambulatory Visit: Admitting: Cardiology

## 2024-12-11 ENCOUNTER — Ambulatory Visit: Attending: Cardiovascular Disease | Admitting: Cardiology

## 2024-12-11 VITALS — BP 126/74 | HR 74 | Ht 64.0 in | Wt 159.0 lb

## 2024-12-11 DIAGNOSIS — D869 Sarcoidosis, unspecified: Secondary | ICD-10-CM | POA: Diagnosis not present

## 2024-12-11 DIAGNOSIS — G4733 Obstructive sleep apnea (adult) (pediatric): Secondary | ICD-10-CM

## 2024-12-11 DIAGNOSIS — R072 Precordial pain: Secondary | ICD-10-CM | POA: Diagnosis not present

## 2024-12-11 DIAGNOSIS — R0609 Other forms of dyspnea: Secondary | ICD-10-CM | POA: Diagnosis not present

## 2024-12-11 DIAGNOSIS — R002 Palpitations: Secondary | ICD-10-CM

## 2024-12-11 MED ORDER — METOPROLOL TARTRATE 50 MG PO TABS: 50.0000 mg | ORAL_TABLET | ORAL | 0 refills | Status: AC

## 2024-12-11 NOTE — Patient Instructions (Addendum)
 Medication Instructions:  The current medical regimen is effective;  continue present plan and medications.  *If you need a refill on your cardiac medications before your next appointment, please call your pharmacy*  Testing/Procedures: Your physician has requested that you have an echocardiogram. Echocardiography is a painless test that uses sound waves to create images of your heart. It provides your doctor with information about the size and shape of your heart and how well your hearts chambers and valves are working. This procedure takes approximately one hour. There are no restrictions for this procedure. Please do NOT wear cologne, perfume, aftershave, or lotions (deodorant is allowed). Please arrive 15 minutes prior to your appointment time.  Please note: We ask at that you not bring children with you during ultrasound (echo/ vascular) testing. Due to room size and safety concerns, children are not allowed in the ultrasound rooms during exams. Our front office staff cannot provide observation of children in our lobby area while testing is being conducted. An adult accompanying a patient to their appointment will only be allowed in the ultrasound room at the discretion of the ultrasound technician under special circumstances. We apologize for any inconvenience.  Your physician has recommended that you have a sleep study. This test records several body functions during sleep, including: brain activity, eye movement, oxygen and carbon dioxide blood levels, heart rate and rhythm, breathing rate and rhythm, the flow of air through your mouth and nose, snoring, body muscle movements, and chest and belly movement.    Your cardiac CT will be scheduled at:  [facility] [address]  [place], [place] [address]  Please enter the parking lot using the [address] entrance and use the FREE valet service at the patient drop-off area. Enter the building and check-in  with registration on the main floor.  Please follow these instructions carefully (unless otherwise directed):  An IV will be required for this test and Nitroglycerin will be given.  Hold all erectile dysfunction medications at least 3 days (72 hrs) prior to test. (Ie viagra, cialis, sildenafil, tadalafil, etc)   On the Night Before the Test: Be sure to Drink plenty of water. Do not consume any caffeinated/decaffeinated beverages or chocolate 12 hours prior to your test. Do not take any antihistamines 12 hours prior to your test.  On the Day of the Test: Drink plenty of water until 1 hour prior to the test. Do not eat any food 1 hour prior to test. You may take your regular medications prior to the test.  Take metoprolol  (Lopressor ) two hours prior to test. If you take Furosemide/Hydrochlorothiazide/Spironolactone/Chlorthalidone, please HOLD on the morning of the test. Patients who wear a continuous glucose monitor MUST remove the device prior to scanning. FEMALES- please wear underwire-free bra if available, avoid dresses & tight clothing  After the Test: Drink plenty of water. After receiving IV contrast, you may experience a mild flushed feeling. This is normal. On occasion, you may experience a mild rash up to 24 hours after the test. This is not dangerous. If this occurs, you can take Benadryl 25 mg, Zyrtec, Claritin, or Allegra and increase your fluid intake. (Patients taking Tikosyn should avoid Benadryl, and may take Zyrtec, Claritin, or Allegra) If you experience trouble breathing, this can be serious. If it is severe call 911 IMMEDIATELY. If it is mild, please call our office.  We will call to schedule your test 2-4 weeks out understanding that some insurance companies will need an  authorization prior to the service being performed.   For more information and frequently asked questions, please visit our website : [email or web address]  For non-scheduling related  questions, please contact the cardiac imaging nurse navigator should you have any questions/concerns: Cardiac Imaging Nurse Navigators Direct Office Dial: [phone]   For scheduling needs, including cancellations and rescheduling, please call Brittany, [phone].  Follow-Up: At [facility], you and your health needs are our priority.  As part of our continuing mission to provide you with exceptional heart care, our providers are all part of one team.  This team includes your primary Cardiologist (physician) and Advanced Practice Providers or APPs (Physician Assistants and Nurse Practitioners) who all work together to provide you with the care you need, when you need it.  Your next appointment:   Follow up will be based on the results of the above  testing.   We recommend signing up for the patient portal called MyChart.  Sign up information is provided on this After Visit Summary.  MyChart is used to connect with patients for Virtual Visits (Telemedicine).  Patients are able to view lab/test results, encounter notes, upcoming appointments, etc.  Non-urgent messages can be sent to your provider as well.   To learn more about what you can do with MyChart, go to [email or web address].

## 2024-12-11 NOTE — Progress Notes (Signed)
" °  Cardiology Office Note:  .   Date:  12/11/2024  ID:  Stephanie Stephens, DOB 09/14/1966, MRN [id number] PCP: Stephanie Therisa MATSU, PA  [facility] Providers Cardiologist:  None     History of Present Illness: Stephanie Stephens is a 59 y.o. female Discussed the use of AI scribe  History of Present Illness Stephanie Stephens Stephanie Stephens is a 59 year old female with sarcoidosis and asthma who presents with palpitations and skipped heartbeats.  Palpitations and arrhythmia symptoms - Palpitations and skipped heartbeats described as 'little letter things' with her heart - Symptoms associated with significant emotional stress following the loss of two brothers within the same year - Anxiety related to recent bereavement  Dyspnea and chest symptoms - Shortness of breath attributed to asthma and sarcoidosis - Physical exertion, such as walking to the bus stop or climbing stairs, results in feeling winded - Occasional chest tightness or pressure  Sleep disturbances and possible sleep apnea - Sleep disturbances including waking up with headaches - Friend has observed episodes of apnea during sleep - Daytime fatigue and difficulty maintaining restful sleep despite use of wedge pillow - Previous digital pulse oximeter study performed to assess oxygen levels during sleep      ROS: Apnea-like spells.  Fatigue.  Studies Reviewed: Stephanie   EKG Interpretation Date/Time:  Friday December 11 2024 15:05:48 EST Ventricular Rate:  74 PR Interval:  198 QRS Duration:  88 QT Interval:  366 QTC Calculation: 406 R Axis:   55  Text Interpretation: Normal sinus rhythm Normal ECG When compared with ECG of 08-Sep-2014 13:15, PREVIOUS ECG IS PRESENT Confirmed by Jeffrie Anes ([id number]) on 12/11/2024 3:48:47 PM    Results   Risk Assessment/Calculations:            Physical Exam:   VS:  BP 126/74 (BP Location: Left Arm, Patient Position: Sitting, Cuff Size: Normal)   Pulse 74   Ht 5' 4 (1.626 m)   Wt  159 lb (72.1 kg)   SpO2 97%   BMI 27.29 kg/m    Wt Readings from Last 3 Encounters:  12/11/24 159 lb (72.1 kg)  09/16/24 153 lb 9.6 oz (69.7 kg)  09/08/24 150 lb (68 kg)    GEN: Well nourished, well developed in no acute distress NECK: No JVD; No carotid bruits CARDIAC: RRR, no murmurs, no rubs, no gallops RESPIRATORY:  Clear to auscultation without rales, wheezing or rhonchi  ABDOMEN: Soft, non-tender, non-distended EXTREMITIES:  No edema; No deformity   ASSESSMENT AND PLAN: .    Assessment and Plan Assessment & Plan Palpitations Precordial chest pain/tightness/shortness of breath Intermittent palpitations potentially related to anxiety and recent bereavement. No family history of heart disease. Non-smoker and abstains from alcohol. Differential includes anxiety-related palpitations and potential cardiac issues. - Ordered echocardiogram to assess heart function and valve status. - Ordered coronary CT scan to evaluate for arterial stenosis.  Evaluation for sleep-disordered breathing Reports of possible sleep-disordered breathing with symptoms of waking up with headaches and daytime fatigue. Previous overnight pulse oximeter study conducted, but results unclear. Discussed potential for home sleep study as a screening tool. - Will consider home sleep study for further evaluation of sleep-disordered breathing.  Sarcoidosis - Followed by pulmonary in the past       Dispo: Will follow-up with test studies  Signed, Anes Jeffrie, MD  "

## 2024-12-13 ENCOUNTER — Other Ambulatory Visit: Payer: Self-pay | Admitting: Internal Medicine

## 2024-12-21 ENCOUNTER — Telehealth: Payer: Self-pay | Admitting: Cardiology

## 2024-12-21 DIAGNOSIS — R072 Precordial pain: Secondary | ICD-10-CM

## 2024-12-21 NOTE — Telephone Encounter (Signed)
 Pt has questions about her cardiac CT scheduled 2/10. Please advise.

## 2024-12-22 NOTE — Telephone Encounter (Signed)
 Attempted to call patient. VM full

## 2024-12-22 NOTE — Telephone Encounter (Signed)
 Pt returning call

## 2024-12-23 NOTE — Telephone Encounter (Signed)
 Patient states she is concerned about taking Lopressor  for upcoming cardiac CT.  Explained to patient why this medication is prescribed, to slow the heart rate down enough to get clear images. Dose is based on protocols taking BP/HR into consideration. Also explained if she is not willing to take this medication she likely will not be able to complete the scan.  Patient repeatedly states she is not comfortable taking this medication and would like to know what other testing could be done.  Will forward to Dr. Jeffrie to review for further advisement.

## 2024-12-24 NOTE — Telephone Encounter (Signed)
 Cancel coronary CT. Order Lexiscan nuclear stress test. Thank you  Your physician has requested that you have a Lexiscan Myocardial Perfusion Imaging Study.  Please arrive 15 minutes prior to your appointment time for registration.  The test will take approximately 3 to 4 hours to complete.  Instructions: You may take your medications the morning of the test (someone from the testing area will call you with these specific instructions). Do not eat or drink 3 hours prior to your test, except you may have unlimited water. Do not consume products containing caffeine (regular or decaffeinated) for 12 hours prior to your test (coffee, tea, soda, chocolate, energy products and some medications). Please wear a 2-piece outfit (no dresses, overalls, etc.) Do not wear cologne, perfume, aftershave or lotions (deodorant is allowed) If you use an inhaler, bring it with you to the test.  Please note: If anyone comes with you to the appointment, they will need to remain in the main lobby due to limited space in the testing area. We also ask that you not bring children with you during testing. Due to room size and safety concerns, children are not allowed in the testing rooms during exams. Our front office staff cannot provide observation of children in our lobby area while testing is being conducted.  if you are pregnant or breastfeeding, please notify the technologists prior to your appointment.  Pt is aware the coronary CT will be cancelled and Lexiscan Myoview will be ordered.  She is aware of the above instructions and that she will be contacted to be scheduled.

## 2024-12-25 ENCOUNTER — Telehealth (HOSPITAL_COMMUNITY): Payer: Self-pay | Admitting: Cardiology

## 2024-12-25 NOTE — Telephone Encounter (Signed)
 I called patient to schedule the ordered Myoview and she does not want to have at this time. She will  have echo and start back on her aspirin. After that she will decide after that result. Order will be removed fro the nuc WQ. Thank you.

## 2024-12-29 ENCOUNTER — Ambulatory Visit (HOSPITAL_COMMUNITY)

## 2025-01-18 ENCOUNTER — Ambulatory Visit (HOSPITAL_COMMUNITY)
